# Patient Record
Sex: Female | Born: 1985 | Race: White | Hispanic: No | Marital: Married | Smoking: Former smoker
Health system: Southern US, Community
[De-identification: ages and names within clinical notes are randomized; demographics above are authoritative.]

## PROBLEM LIST (undated history)

## (undated) ENCOUNTER — Inpatient Hospital Stay (HOSPITAL_COMMUNITY): Payer: Self-pay

## (undated) DIAGNOSIS — E039 Hypothyroidism, unspecified: Secondary | ICD-10-CM

## (undated) DIAGNOSIS — M79662 Pain in left lower leg: Secondary | ICD-10-CM

## (undated) DIAGNOSIS — S060XAA Concussion with loss of consciousness status unknown, initial encounter: Secondary | ICD-10-CM

## (undated) DIAGNOSIS — F419 Anxiety disorder, unspecified: Secondary | ICD-10-CM

## (undated) DIAGNOSIS — A6000 Herpesviral infection of urogenital system, unspecified: Secondary | ICD-10-CM

## (undated) DIAGNOSIS — M7989 Other specified soft tissue disorders: Secondary | ICD-10-CM

## (undated) DIAGNOSIS — F909 Attention-deficit hyperactivity disorder, unspecified type: Secondary | ICD-10-CM

## (undated) DIAGNOSIS — Z803 Family history of malignant neoplasm of breast: Secondary | ICD-10-CM

## (undated) DIAGNOSIS — F319 Bipolar disorder, unspecified: Secondary | ICD-10-CM

## (undated) DIAGNOSIS — R87619 Unspecified abnormal cytological findings in specimens from cervix uteri: Secondary | ICD-10-CM

## (undated) DIAGNOSIS — R112 Nausea with vomiting, unspecified: Secondary | ICD-10-CM

## (undated) DIAGNOSIS — F32A Depression, unspecified: Secondary | ICD-10-CM

## (undated) DIAGNOSIS — S060X9A Concussion with loss of consciousness of unspecified duration, initial encounter: Secondary | ICD-10-CM

## (undated) DIAGNOSIS — B977 Papillomavirus as the cause of diseases classified elsewhere: Secondary | ICD-10-CM

## (undated) DIAGNOSIS — K219 Gastro-esophageal reflux disease without esophagitis: Secondary | ICD-10-CM

## (undated) DIAGNOSIS — Z9889 Other specified postprocedural states: Secondary | ICD-10-CM

## (undated) DIAGNOSIS — C801 Malignant (primary) neoplasm, unspecified: Secondary | ICD-10-CM

## (undated) HISTORY — DX: Hypothyroidism, unspecified: E03.9

## (undated) HISTORY — PX: TONSILLECTOMY AND ADENOIDECTOMY: SHX28

## (undated) HISTORY — PX: ABDOMINAL HYSTERECTOMY: SHX81

## (undated) HISTORY — DX: Unspecified abnormal cytological findings in specimens from cervix uteri: R87.619

## (undated) HISTORY — DX: Depression, unspecified: F32.A

## (undated) HISTORY — DX: Family history of malignant neoplasm of breast: Z80.3

---
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
## 2009-06-07 ENCOUNTER — Emergency Department (HOSPITAL_BASED_OUTPATIENT_CLINIC_OR_DEPARTMENT_OTHER): Admission: EM | Admit: 2009-06-07 | Discharge: 2009-06-07 | Payer: Self-pay | Admitting: Emergency Medicine

## 2010-05-31 ENCOUNTER — Emergency Department (HOSPITAL_BASED_OUTPATIENT_CLINIC_OR_DEPARTMENT_OTHER)
Admission: EM | Admit: 2010-05-31 | Discharge: 2010-05-31 | Disposition: A | Discharge status: Home / Self Care | Payer: 59 | Attending: Emergency Medicine | Admitting: Emergency Medicine

## 2010-05-31 DIAGNOSIS — F17200 Nicotine dependence, unspecified, uncomplicated: Secondary | ICD-10-CM | POA: Insufficient documentation

## 2010-05-31 DIAGNOSIS — R112 Nausea with vomiting, unspecified: Secondary | ICD-10-CM | POA: Insufficient documentation

## 2010-05-31 LAB — URINALYSIS, ROUTINE W REFLEX MICROSCOPIC
Glucose, UA: NEGATIVE mg/dL
Hgb urine dipstick: NEGATIVE
Ketones, ur: >80 mg/dL — AB
Protein, ur: NEGATIVE mg/dL
pH: 6 (ref 5.0–8.0)

## 2010-05-31 LAB — CBC
Platelets: 221 10*3/uL (ref 150–400)
RBC: 4.67 MIL/uL (ref 3.87–5.11)
RDW: 12.6 % (ref 11.5–15.5)
WBC: 8.1 10*3/uL (ref 4.0–10.5)

## 2010-05-31 LAB — DIFFERENTIAL
Basophils Absolute: 0 10*3/uL (ref 0.0–0.1)
Eosinophils Absolute: 0.1 10*3/uL (ref 0.0–0.7)
Eosinophils Relative: 1 % (ref 0–5)
Lymphocytes Relative: 33 % (ref 12–46)
Neutrophils Relative %: 59 % (ref 43–77)

## 2010-05-31 LAB — PREGNANCY, URINE: Preg Test, Ur: NEGATIVE

## 2010-05-31 LAB — BASIC METABOLIC PANEL
Chloride: 105 mEq/L (ref 96–112)
GFR calc non Af Amer: >60 mL/min (ref >60)
Potassium: 4 mEq/L (ref 3.5–5.1)
Sodium: 141 mEq/L (ref 135–145)

## 2010-05-31 LAB — GLUCOSE, CAPILLARY

## 2010-06-04 LAB — URINALYSIS, ROUTINE W REFLEX MICROSCOPIC
Bilirubin Urine: NEGATIVE
Leukocytes, UA: NEGATIVE
Nitrite: NEGATIVE
Specific Gravity, Urine: 1.011 (ref 1.005–1.030)
Urobilinogen, UA: 1 mg/dL (ref 0.0–1.0)

## 2010-06-04 LAB — URINE MICROSCOPIC-ADD ON

## 2010-06-04 LAB — COMPREHENSIVE METABOLIC PANEL
ALT: 27 U/L (ref 0–35)
Albumin: 4.1 g/dL (ref 3.5–5.2)
Calcium: 9 mg/dL (ref 8.4–10.5)
Glucose, Bld: 84 mg/dL (ref 70–99)
Sodium: 141 mEq/L (ref 135–145)
Total Protein: 8 g/dL (ref 6.0–8.3)

## 2010-06-04 LAB — DIFFERENTIAL
Eosinophils Absolute: 0.1 10*3/uL (ref 0.0–0.7)
Lymphs Abs: 1.4 10*3/uL (ref 0.7–4.0)
Monocytes Relative: 9 % (ref 3–12)
Neutro Abs: 2 10*3/uL (ref 1.7–7.7)
Neutrophils Relative %: 54 % (ref 43–77)

## 2010-06-04 LAB — CBC
Hemoglobin: 12.8 g/dL (ref 12.0–15.0)
MCHC: 33.9 g/dL (ref 30.0–36.0)
Platelets: 172 10*3/uL (ref 150–400)
RDW: 12.7 % (ref 11.5–15.5)

## 2010-06-04 LAB — PREGNANCY, URINE: Preg Test, Ur: NEGATIVE

## 2010-07-27 NOTE — Discharge Summary (Signed)
NAME:  Lori Callahan, Lori Callahan                           ACCOUNT NO.:  [id number]   MEDICAL RECORD NO.:  [id number]                   PATIENT TYPE:  INP   LOCATION:  0102                                 FACILITY:  [facility]   PHYSICIAN:  Cindie Crumbly, M.D.               DATE OF BIRTH:  14-May-1985   DATE OF ADMISSION:  09/03/2002  DATE OF DISCHARGE:                                 DISCHARGE SUMMARY   REASON FOR ADMISSION:  This 25 year old white female was admitted  complaining of depression, status post 2 overdoses within a period of 3  weeks prior to this admission.  For further history of present illness,  please see the patient's psychiatry admission assessment.   PHYSICAL EXAMINATION:  At the time of admission was significant for obesity.  She had an otherwise unremarkable physical examination.   LABORATORY EXAMINATION:  The patient underwent a laboratory workup to rule  out any other medical problems contributing to her symptomatology.  A urine  drug screen was negative.  A CBC was unremarkable.  A UA was unremarkable.  Basic metabolic panel was within normal limits.  Hepatic panel was within  normal limits.  GGT was within normal limits.  T3, T4 and TSH were all  within normal limits.  A urine pregnancy test was negative.  An RPR was  nonreactive.  A urine probe for gonorrhea and chlamydia as well as an HIV  screen are pending at the time of discharge.   HOSPITAL COURSE:  On admission, the patient was psychomotor agitated,  oppositional and defiant, with decreased concentration, poor impulse  control.  Her affect and mood were depressed, irritable and angry.  She was  intrusive, manipulative and attention seeking.  She was continued on a trial  of Prozac and titrated up to 40 mg p.o. daily.  She tolerated this  medication well without side effects.  She complained of nicotine withdrawal  and was given a Nicoderm patch which she tolerated well.  At the time of  discharge, her affect and  mood have improved.  She denies any homicidal or  suicidal ideation. She is actively participating in all aspects of the  therapeutic treatment program, is motivated for outpatient therapy, and no  longer appears to be a danger to herself or others.  I have discussed her  case at length with Dr. Gardner Candle, her outpatient psychiatrist, regarding  her medication and psychotherapeutic treatments while hospitalized.   CONDITION ON DISCHARGE:  Improved.   DISCHARGE DIAGNOSES:   AXIS I:  1. Major depression, single episode, severe, without psychosis.  2. Oppositional-defiant disorder.  3. Rule out conduct disorder.  4. Nicotine dependence.   AXIS II:  1. Rule out learning disorder not otherwise specified.  2. Rule out personality disorder not otherwise specified.   AXIS III:  Obesity.   AXIS IV:  Severe.   AXIS V:  Code 20 on admission,  code 30 on discharge.   FURTHER EVALUATION AND TREATMENT RECOMMENDATIONS:  1. The patient is discharged to home.  2. She is discharged on an unrestricted level of activity and a regular     diet.  3. She will follow up with her outpatient psychotherapist, Stevphen Meuse, and     her outpatient psychiatrist for medication management, Dr. Gardner Candle,     and consequently I will sign off on the case at this time.  The patient     will follow up with her primary care physician for all further aspects of     her medical care.   DISCHARGE MEDICATIONS:  Prozac 40 mg p.o. daily.                                                 Cindie Crumbly, M.D.    TS/MEDQ  D:  09/08/2002  T:  09/08/2002  Job:  [id number]

## 2010-07-27 NOTE — H&P (Signed)
NAME:  Lori Callahan, Lori Callahan                           ACCOUNT NO.:  [id number]   MEDICAL RECORD NO.:  [id number]                   PATIENT TYPE:  INP   LOCATION:  0102                                 [facility]ACILITY[facility]  [facility]   PHYSICIAN:  Cindie Crumbly, M.D.               DATE OF BIRTH:  [birth year]   DATE OF ADMISSION:  09/03/2002  DATE OF DISCHARGE:                         PSYCHIATRIC ADMISSION ASSESSMENT   REASON FOR ADMISSION:  This 25 year old white female was admitted  complaining of depression status post 2 overdoses as suicide attempts in the  past 3 weeks.  The patient continues to admit to suicidal ideation and  refuses to contract for safety.   HISTORY OF PRESENT ILLNESS:  The patient complains of an increasingly  depressed, irritable and angry mood most of the day nearly every day, giving  up on activities previously found pleasurable, decreased school performance,  anhedonia, feelings of hopelessness, helplessness, worthlessness, decreased  concentration and energy level, increased symptoms of fatigue, hypersomnia,  weight gain, and recurrent thoughts of death.  She refuses to contract for  safety.  Her current psychosocial stressors include frequent arguments with  her boyfriend and her parents.   PAST PSYCHIATRIC HISTORY:  Significant for an overdose on Lexapro  approximately 3 weeks ago and an overdose on Tylenol 5 days ago.  She did  not tell her mother about the overdoses until the day prior to admission  when she was at her psychiatrist's office who then sent her to this facility  for more definitive stabilization.  The patient has been followed in  outpatient treatment by Gardner Candle, M.D. and he has seen her for the past  month.  She has also started seeing a therapist, Stevphen Meuse, who she has  seen 1 time over the past week.  The patient has been on a trial of Lexapro  in the past where she showed only a partial response and then it stopped  working.   DRUG AND ALCOHOL  ABUSE HISTORY:  Significant for the patient smoking 1/2  pack of cigarettes per day for the past 2 months.  She denies any use of  alcohol  or street drugs.   PAST MEDICAL HISTORY:  Significant for obesity and an adenoidectomy in the  past.  She has no known drug allergies or sensitivities.   CURRENT MEDICATIONS:  Prozac 20 mg p.o. daily which she has taken for the  past 2-3 weeks.   STRENGTHS AND ASSETS:  Her parents are supportive of her.   FAMILY AND SOCIAL HISTORY:  The patient lives with her mother, stepfather,  and 40 year old half brother.  Biological father committed suicide when the  patient was 25 years of age.  He had a history of polysubstance dependence.  The patient is a rising 12th grader.   MENTAL STATUS EXAM:  The patient presents as a well-developed, well-  nourished adolescent white female, who is alert, oriented  x4, psychomotor  agitated, and whose appearance is compatible with her stated age.  Her  speech is coherent with a decreased rate and volume of speech, increased  speech latency.  She displays no looseness of associations, phonemic errors  or evidence of a thought disorder.  She displays poor impulse control, is  somewhat oppositional and defiant, with decreased concentration.  Her affect  and mood are depressed, irritable and angry.  Her immediate recall, short  term memory and remote memory are intact.  Similarities and differences are  within normal limits and she is able to abstract the proverbs.  Her thought  processes are goal directed.    ADMISSION DIAGNOSES:   AXIS I:  1. Major depression, single episode, severe, without psychosis.  2. Rule out oppositional-defiant disorder.  3. Nicotine dependence.   AXIS II:  1. Rule out learning disorder not otherwise specified.  2. Rule out personality disorder not otherwise specified.   AXIS III:  Obesity.   AXIS IV:  Severe.   AXIS V:  Code 20.   FURTHER EVALUATION AND TREATMENT  RECOMMENDATIONS:  1. Estimated length of stay for the patient on the inpatient unit is 5 to 7     days.  2. Initial discharge plan is to discharge the patient to home.  3. Initial plan of care is to continue the patient on Prozac and provide a     Nicoderm patch for nicotine withdrawal.  Psychotherapy will focus on     improving the patient's impulse control, decreasing cognitive distortions     and potential for self harm.  A laboratory workup will also be initiated     to rule out any other medical problems contributing to her     symptomatology.                                                 Cindie Crumbly, M.D.    TS/MEDQ  D:  09/04/2002  T:  09/04/2002  Job:  [id number]

## 2011-02-28 ENCOUNTER — Emergency Department (HOSPITAL_BASED_OUTPATIENT_CLINIC_OR_DEPARTMENT_OTHER)
Admission: EM | Admit: 2011-02-28 | Discharge: 2011-02-28 | Disposition: A | Discharge status: Home / Self Care | Payer: 59 | Attending: Emergency Medicine | Admitting: Emergency Medicine

## 2011-02-28 DIAGNOSIS — B009 Herpesviral infection, unspecified: Secondary | ICD-10-CM | POA: Insufficient documentation

## 2011-02-28 DIAGNOSIS — B001 Herpesviral vesicular dermatitis: Secondary | ICD-10-CM

## 2011-02-28 HISTORY — DX: Herpesviral infection of urogenital system, unspecified: A60.00

## 2011-02-28 HISTORY — DX: Papillomavirus as the cause of diseases classified elsewhere: B97.7

## 2011-02-28 MED ORDER — HYDROCODONE-ACETAMINOPHEN 5-500 MG PO TABS: 1.0000 | ORAL_TABLET | Freq: Four times a day (QID) | ORAL | Status: AC | PRN

## 2011-02-28 MED ORDER — ACYCLOVIR 400 MG PO TABS: 400.0000 mg | ORAL_TABLET | Freq: Three times a day (TID) | ORAL | Status: AC

## 2011-02-28 NOTE — ED Provider Notes (Signed)
History     CSN: [id number]  Arrival date & time 02/28/11  6213   First MD Initiated Contact with Patient 02/28/11 2102      Chief Complaint  Patient presents with  . Rash    (Consider location/radiation/quality/duration/timing/severity/associated sxs/prior treatment) HPI Comments: Pt states that she was seen by her ob and was given a cream for wart:pt states that now she has pain and rash to the area  Patient is a 25 y.o. female presenting with rash. The history is provided by the patient. No language interpreter was used.  Rash  This is a new problem. The current episode started 2 days ago. The problem has not changed since onset.The problem is associated with nothing. There has been no fever. The patient is experiencing no pain. The pain has been constant since onset. Associated symptoms include pain and weeping. She has tried nothing for the symptoms.    Past Medical History  Diagnosis Date  . HPV (human papilloma virus) infection   . Herpes genitalia     Past Surgical History  Procedure Date  . Cesarean section     No family history on file.  History  Substance Use Topics  . Smoking status: Current Everyday Smoker  . Smokeless tobacco: Not on file  . Alcohol Use: No    OB History    Grav Para Term Preterm Abortions TAB SAB Ect Mult Living                  Review of Systems  Skin: Positive for rash.  All other systems reviewed and are negative.    Allergies  Review of patient's allergies indicates no known allergies.  Home Medications   Current Outpatient Rx  Name Route Sig Dispense Refill  . VITAMIN B-12 PO Oral Take 1 tablet by mouth daily.      . IMIQUIMOD 5 % EX CREA Topical Apply 1 application topically 3 (three) times a week.      Marland Kitchen LYSINE PO Oral Take 1 tablet by mouth daily.      . ADULT MULTIVITAMIN W/MINERALS CH Oral Take 1 tablet by mouth daily.      Marland Kitchen NAPROXEN SODIUM 550 MG PO TABS Oral Take 550 mg by mouth once as needed. For pain         BP 128/73  Pulse 88  Temp(Src) 98.5 F (36.9 C) (Oral)  Resp 16  Ht 5\' 5"  (1.651 m)  Wt 190 lb (86.183 kg)  BMI 31.62 kg/m2  SpO2 100%  LMP 02/04/2011  Physical Exam  Nursing note and vitals reviewed. Constitutional: She appears well-developed and well-nourished.  Cardiovascular: Normal rate and regular rhythm.   Pulmonary/Chest: Effort normal and breath sounds normal.  Genitourinary:       Pt has multiple indurated draining ulcer to bilateral labia  Musculoskeletal: Normal range of motion.  Neurological: She is alert.    ED Course  Procedures (including critical care time)  Labs Reviewed - No data to display No results found.   1. Herpes labialis       MDM  Pt has history of herpes:exam consistent with the same:will treat        Teressa Lower, NP 02/28/11 2129  Teressa Lower, NP 02/28/11 2129

## 2011-02-28 NOTE — ED Notes (Signed)
C/o rash to vaginal/rectal area after using cream to treat HPV

## 2011-02-28 NOTE — ED Provider Notes (Signed)
Medical screening examination/treatment/procedure(s) were performed by non-physician practitioner and as supervising physician I was immediately available for consultation/collaboration.  Juliet Rude. Rubin Payor, MD 02/28/11 [phone]

## 2011-07-18 ENCOUNTER — Inpatient Hospital Stay (HOSPITAL_COMMUNITY)
Admission: AD | Admit: 2011-07-18 | Discharge: 2011-07-18 | Disposition: A | Discharge status: Home / Self Care | Payer: Medicaid Other | Source: Ambulatory Visit | Attending: Obstetrics & Gynecology | Admitting: Obstetrics & Gynecology

## 2011-07-18 ENCOUNTER — Inpatient Hospital Stay (HOSPITAL_COMMUNITY): Payer: Medicaid Other

## 2011-07-18 ENCOUNTER — Encounter (HOSPITAL_COMMUNITY): Payer: Self-pay | Admitting: *Deleted

## 2011-07-18 DIAGNOSIS — O26899 Other specified pregnancy related conditions, unspecified trimester: Secondary | ICD-10-CM

## 2011-07-18 DIAGNOSIS — R109 Unspecified abdominal pain: Secondary | ICD-10-CM

## 2011-07-18 DIAGNOSIS — O219 Vomiting of pregnancy, unspecified: Secondary | ICD-10-CM

## 2011-07-18 DIAGNOSIS — O210 Mild hyperemesis gravidarum: Secondary | ICD-10-CM | POA: Insufficient documentation

## 2011-07-18 LAB — URINALYSIS, ROUTINE W REFLEX MICROSCOPIC
Glucose, UA: NEGATIVE mg/dL
Leukocytes, UA: NEGATIVE
pH: 6 (ref 5.0–8.0)

## 2011-07-18 LAB — POCT PREGNANCY, URINE: Preg Test, Ur: POSITIVE — AB

## 2011-07-18 LAB — CBC
HCT: 38.7 % (ref 36.0–46.0)
MCH: 30.8 pg (ref 26.0–34.0)
MCHC: 34.1 g/dL (ref 30.0–36.0)
RDW: 13 % (ref 11.5–15.5)

## 2011-07-18 LAB — WET PREP, GENITAL: Yeast Wet Prep HPF POC: NONE SEEN

## 2011-07-18 MED ORDER — ONDANSETRON 8 MG PO TBDP
8.0000 mg | ORAL_TABLET | Freq: Once | ORAL | Status: AC
  Administered 2011-07-18: 8 mg via ORAL
  Filled 2011-07-18: qty 1

## 2011-07-18 MED ORDER — PROMETHAZINE HCL 25 MG PO TABS: 12.5000 mg | ORAL_TABLET | Freq: Four times a day (QID) | ORAL | Status: DC | PRN

## 2011-07-18 NOTE — MAU Provider Note (Signed)
History     CSN: [id number]  Arrival date & time 07/18/11  1959   None     Chief Complaint  Patient presents with  . Morning Sickness    (Consider location/radiation/quality/duration/timing/severity/associated sxs/prior treatment) HPI Lori Callahan is a 26 y.o. female @ [redacted]w[redacted]d gestation who presents to [facility]U for lower abdominal cramping that started a few days ago. [facility] pregnancy test. She noted vaginal spotting a few days ago but none since then. Complains of nausea and vomiting. The history was provided by the patient.  Past Medical History  Diagnosis Date  . HPV (human papilloma virus) infection   . Herpes genitalia     Past Surgical History  Procedure Date  . Cesarean section     Family History  Problem Relation Age of Onset  . Cancer Maternal Grandmother     History  Substance Use Topics  . Smoking status: Current Everyday Smoker  . Smokeless tobacco: Not on file  . Alcohol Use: No    OB History    Grav Para Term Preterm Abortions TAB SAB Ect Mult Living   3 1 1       1       Review of Systems  Constitutional: Positive for appetite change and fatigue. Negative for fever and chills.  HENT: Negative.   Eyes: Negative.   Respiratory: Negative.   Cardiovascular: Negative.   Gastrointestinal: Positive for nausea, vomiting and abdominal pain. Negative for diarrhea and constipation.  Genitourinary: Positive for frequency and pelvic pain. Negative for dysuria, urgency, vaginal bleeding and vaginal discharge.  Musculoskeletal: Negative for back pain.  Skin: Negative.   Neurological: Negative for dizziness and headaches.  Psychiatric/Behavioral: Negative for confusion. The patient is not nervous/anxious.     Allergies  Review of patient's allergies indicates no known allergies.  Home Medications  No current outpatient prescriptions on file.  BP 132/80  Pulse 101  Temp(Src) 98 F (36.7 C) (Oral)  Resp  18  Ht 5\' 5"  (1.651 m)  Wt 185 lb (83.915 kg)  BMI 30.79 kg/m2  LMP 06/03/2011  Physical Exam  Nursing note and vitals reviewed. Constitutional: She is oriented to person, place, and time. She appears well-developed and well-nourished. No distress.  HENT:  Head: Normocephalic.  Eyes: EOM are normal.  Neck: Neck supple.  Cardiovascular:       Tachycardia   Pulmonary/Chest: Effort normal.  Abdominal: Soft. There is no tenderness.       Unable to reproduce the cramping pain that the patient has had.  Genitourinary:       External genitalia without lesions. White discharge vaginal vault. No blood noted. Cervix long, closed, no CMT, no adnexal tenderness. Uterus slightly enlarged.  Musculoskeletal: Normal range of motion.  Neurological: She is alert and oriented to person, place, and time. No cranial nerve deficit.  Skin: Skin is warm and dry.  Psychiatric: She has a normal mood and affect. Her behavior is normal. Judgment and thought content normal.   Results for orders placed during the hospital encounter of 07/18/11 (from the past 24 hour(s))  URINALYSIS, ROUTINE W REFLEX MICROSCOPIC     Status: Abnormal   Collection Time   07/18/11  8:25 PM      Component Value Range   Color, Urine YELLOW  YELLOW    APPearance CLEAR  CLEAR    Specific Gravity, Urine <1.005 (*) 1.005 - 1.030    pH 6.0  5.0 - 8.0  Glucose, UA NEGATIVE  NEGATIVE (mg/dL)   Hgb urine dipstick NEGATIVE  NEGATIVE    Bilirubin Urine NEGATIVE  NEGATIVE    Ketones, ur NEGATIVE  NEGATIVE (mg/dL)   Protein, ur NEGATIVE  NEGATIVE (mg/dL)   Urobilinogen, UA 0.2  0.0 - 1.0 (mg/dL)   Nitrite NEGATIVE  NEGATIVE    Leukocytes, UA NEGATIVE  NEGATIVE   POCT PREGNANCY, URINE     Status: Abnormal   Collection Time   07/18/11  8:36 PM      Component Value Range   Preg Test, Ur POSITIVE (*) NEGATIVE   HCG, QUANTITATIVE, PREGNANCY     Status: Abnormal   Collection Time   07/18/11  9:30 PM      Component Value Range   hCG, Beta  Chain, Quant, S 96045 (*) <5 (mIU/mL)  ABO/RH     Status: Normal (Preliminary result)   Collection Time   07/18/11  9:30 PM      Component Value Range   ABO/RH(D) B POS    CBC     Status: Normal   Collection Time   07/18/11  9:30 PM      Component Value Range   WBC 9.5  4.0 - 10.5 (K/uL)   RBC 4.28  3.87 - 5.11 (MIL/uL)   Hemoglobin 13.2  12.0 - 15.0 (g/dL)   HCT 40.9  81.1 - 91.4 (%)   MCV 90.4  78.0 - 100.0 (fL)   MCH 30.8  26.0 - 34.0 (pg)   MCHC 34.1  30.0 - 36.0 (g/dL)   RDW 78.2  95.6 - 21.3 (%)   Platelets 183  150 - 400 (K/uL)   ED Course  Procedures US Ob Comp Less 14 Wks  07/18/2011  *RADIOLOGY REPORT*  Clinical Data: Positive pregnancy test with cramping and vaginal spotting.  OBSTETRIC <14 WK ULTRASOUND  Technique:  Transabdominal ultrasound was performed for evaluation of the gestation as well as the maternal uterus and adnexal regions.  Comparison:  None.  Intrauterine gestational sac: Single intrauterine gestational sac visualized. Yolk sac: Visualized Embryo: Visualized Cardiac Activity: Visualized Heart Rate: 149 bpm  CRL:  5.8 mm  6w  3d          [place] EDC: 03/09/2012  Maternal uterus/Adnexae: No evidence for subchorionic hemorrhage.  The maternal ovaries are unremarkable.  No free fluid in the cul-de-sac.  IMPRESSION: Single living intrauterine gestation at estimated 6-week-3-day gestational age by crown-rump length.  Original Report Authenticated By: ERIC A. MANSELL, M.D.    Assessment: IUP @ 6 weeks 3 days with cardiac activity   Discomforts of pregnancy   Nausea and vomiting during pregnancy  Plan:  Start prenatal care   Rx Phenergan   Return as needed.   MDM

## 2011-07-18 NOTE — MAU Note (Signed)
Nausea for the last 2 weeks. A little bit of cramping and spotting on and off for a week.

## 2011-07-18 NOTE — Discharge Instructions (Signed)
  ________________________________________     To schedule your Maternity Eligibility Appointment, please call [phone].  When you arrive for your appointment you must bring the following items or information listed below.  Your appointment will be rescheduled if you do not have these items or are 15 minutes late. If currently receiving Medicaid, you MUST bring: 1. Medicaid Card 2. Social Security Card 3. Picture ID 4. Proof of Pregnancy 5. Verification of current address if the address on Medicaid card is incorrect "postmarked mail" If not receiving Medicaid, you MUST bring: 1. Social Security Card 2. Picture ID 3. Birth Certificate (if available) Passport or *Green Card 4. Proof of Pregnancy 5. Verification of current address "postmarked mail" for each income presented. 6. Verification of insurance coverage, if any 7. Check stubs from each employer for the previous month (if unable to present check stub  for each week, we will accept check stub for the first and last week ill the same month.) If you can't locate check stubs, you must bring a letter from the employer(s) and it must have the following information on letterhead, typed, in English: o name of company o company telephone number o how long been with the company, if less than one month o how much person earns per hour o how many hours per week work o the gross pay the person earned for the previous month If you are 26 years old or less, you do not have to bring proof of income unless you work or live with the father of the baby and at that time we will need proof of income from you and/or the father of the baby. Green Card recipients are eligible for Medicaid for Pregnant Women (MPW)    Abdominal Pain During Pregnancy Abdominal discomfort is common in pregnancy. Most of the time, it does not cause harm. There are many causes of abdominal pain. Some causes are more serious than others. Some of the causes of abdominal  pain in pregnancy are easily diagnosed. Occasionally, the diagnosis takes time to understand. Other times, the cause is not determined. Abdominal pain can be a sign that something is very wrong with the pregnancy, or the pain may have nothing to do with the pregnancy at all. For this reason, always tell your caregiver if you have any abdominal discomfort. CAUSES Common and harmless causes of abdominal pain include:  Constipation.   Excess gas and bloating.   Round ligament pain. This is pain that is felt in the folds of the groin.   The position the baby or placenta is in.   Baby kicks.   Braxton-Hicks contractions. These are mild contractions that do not cause cervical dilation.  Serious causes of abdominal pain include:  Ectopic pregnancy. This happens when a fertilized egg implants outside of the uterus.   Miscarriage.   Preterm labor. This is when labor starts at less than 37 weeks of pregnancy.   Placental abruption. This is when the placenta partially or completely separates from the uterus.   Preeclampsia. This is often associated with high blood pressure and has been referred to as "toxemia in pregnancy."   Uterine or amniotic fluid infections.  Causes unrelated to pregnancy include:  Urinary tract infection.   Gallbladder stones or inflammation.   Hepatitis or other liver illness.   Intestinal problems, stomach flu, food poisoning, or ulcer.   Appendicitis.   Kidney (renal) stones.   Kidney infection (pylonephritis).  HOME CARE INSTRUCTIONS  For mild pain:  Do not   have sexual intercourse or put anything in your vagina until your symptoms go away completely.   Get plenty of rest until your pain improves. If your pain does not improve in 1 hour, call your caregiver.   Drink clear fluids if you feel nauseous. Avoid solid food as long as you are uncomfortable or nauseous.   Only take medicine as directed by your caregiver.   Keep all follow-up appointments  with your caregiver.  SEEK IMMEDIATE MEDICAL CARE IF:  You are bleeding, leaking fluid, or passing tissue from the vagina.   You have increasing pain or cramping.   You have persistent vomiting.   You have painful or bloody urination.   You have a fever.   You notice a decrease in your baby's movements.   You have extreme weakness or feel faint.   You have shortness of breath, with or without abdominal pain.   You develop a severe headache with abdominal pain.   You have abnormal vaginal discharge with abdominal pain.   You have persistent diarrhea.   You have abdominal pain that continues even after rest, or gets worse.  MAKE SURE YOU:   Understand these instructions.   Will watch your condition.   Will get help right away if you are not doing well or get worse.  Document Released: 02/25/2005 Document Revised: 02/14/2011 Document Reviewed: 09/21/2010 Exit[employer] Patient Information 2012 [employer].Abdominal Pain During Pregnancy Abdominal discomfort is common in pregnancy. Most of the time, it does not cause harm. There are many causes of abdominal pain. Some causes are more serious than others. Some of the causes of abdominal pain in pregnancy are easily diagnosed. Occasionally, the diagnosis takes time to understand. Other times, the cause is not determined. Abdominal pain can be a sign that something is very wrong with the pregnancy, or the pain may have nothing to do with the pregnancy at all. For this reason, always tell your caregiver if you have any abdominal discomfort. CAUSES Common and harmless causes of abdominal pain include:  Constipation.   Excess gas and bloating.   Round ligament pain. This is pain that is felt in the folds of the groin.   The position the baby or placenta is in.   Baby kicks.   Braxton-Hicks contractions. These are mild contractions that do not cause cervical dilation.  Serious causes of abdominal pain include:  Ectopic  pregnancy. This happens when a fertilized egg implants outside of the uterus.   Miscarriage.   Preterm labor. This is when labor starts at less than 37 weeks of pregnancy.   Placental abruption. This is when the placenta partially or completely separates from the uterus.   Preeclampsia. This is often associated with high blood pressure and has been referred to as "toxemia in pregnancy."   Uterine or amniotic fluid infections.  Causes unrelated to pregnancy include:  Urinary tract infection.   Gallbladder stones or inflammation.   Hepatitis or other liver illness.   Intestinal problems, stomach flu, food poisoning, or ulcer.   Appendicitis.   Kidney (renal) stones.   Kidney infection (pylonephritis).  HOME CARE INSTRUCTIONS  For mild pain:  Do not have sexual intercourse or put anything in your vagina until your symptoms go away completely.   Get plenty of rest until your pain improves. If your pain does not improve in 1 hour, call your caregiver.   Drink clear fluids if you feel nauseous. Avoid solid food as long as you are uncomfortable or nauseous.     Only take medicine as directed by your caregiver.   Keep all follow-up appointments with your caregiver.  SEEK IMMEDIATE MEDICAL CARE IF:  You are bleeding, leaking fluid, or passing tissue from the vagina.   You have increasing pain or cramping.   You have persistent vomiting.   You have painful or bloody urination.   You have a fever.   You notice a decrease in your baby's movements.   You have extreme weakness or feel faint.   You have shortness of breath, with or without abdominal pain.   You develop a severe headache with abdominal pain.   You have abnormal vaginal discharge with abdominal pain.   You have persistent diarrhea.   You have abdominal pain that continues even after rest, or gets worse.  MAKE SURE YOU:   Understand these instructions.   Will watch your condition.   Will get help  right away if you are not doing well or get worse.  Document Released: 02/25/2005 Document Revised: 02/14/2011 Document Reviewed: 09/21/2010 Exit[employer] Patient Information 2012 [employer]. 

## 2011-07-19 LAB — GC/CHLAMYDIA PROBE AMP, GENITAL: Chlamydia, DNA Probe: NEGATIVE

## 2011-07-22 NOTE — MAU Provider Note (Signed)
Medical Screening exam and patient care preformed by advanced practice provider.  Agree with the above management.  

## 2011-09-20 ENCOUNTER — Emergency Department (HOSPITAL_BASED_OUTPATIENT_CLINIC_OR_DEPARTMENT_OTHER): Payer: Medicaid Other

## 2011-09-20 ENCOUNTER — Emergency Department (HOSPITAL_BASED_OUTPATIENT_CLINIC_OR_DEPARTMENT_OTHER)
Admission: EM | Admit: 2011-09-20 | Discharge: 2011-09-20 | Disposition: A | Discharge status: Home / Self Care | Payer: Medicaid Other | Attending: Emergency Medicine | Admitting: Emergency Medicine

## 2011-09-20 ENCOUNTER — Encounter (HOSPITAL_BASED_OUTPATIENT_CLINIC_OR_DEPARTMENT_OTHER): Payer: Self-pay | Admitting: Family Medicine

## 2011-09-20 DIAGNOSIS — E860 Dehydration: Secondary | ICD-10-CM | POA: Insufficient documentation

## 2011-09-20 DIAGNOSIS — R109 Unspecified abdominal pain: Secondary | ICD-10-CM | POA: Insufficient documentation

## 2011-09-20 DIAGNOSIS — R42 Dizziness and giddiness: Secondary | ICD-10-CM | POA: Insufficient documentation

## 2011-09-20 DIAGNOSIS — R5383 Other fatigue: Secondary | ICD-10-CM | POA: Insufficient documentation

## 2011-09-20 DIAGNOSIS — O210 Mild hyperemesis gravidarum: Secondary | ICD-10-CM | POA: Insufficient documentation

## 2011-09-20 DIAGNOSIS — R5381 Other malaise: Secondary | ICD-10-CM | POA: Insufficient documentation

## 2011-09-20 LAB — URINALYSIS, ROUTINE W REFLEX MICROSCOPIC
Bilirubin Urine: NEGATIVE
Hgb urine dipstick: NEGATIVE
Ketones, ur: >80 mg/dL — AB
Protein, ur: NEGATIVE mg/dL
Urobilinogen, UA: 0.2 mg/dL (ref 0.0–1.0)

## 2011-09-20 LAB — BASIC METABOLIC PANEL
BUN: 4 mg/dL — ABNORMAL LOW (ref 6–23)
CO2: 24 mEq/L (ref 19–32)
Calcium: 8.9 mg/dL (ref 8.4–10.5)
Creatinine, Ser: 0.5 mg/dL (ref 0.50–1.10)
GFR calc non Af Amer: >90 mL/min (ref >90)
Glucose, Bld: 82 mg/dL (ref 70–99)
Sodium: 138 mEq/L (ref 135–145)

## 2011-09-20 MED ORDER — METOCLOPRAMIDE HCL 5 MG/ML IJ SOLN
10.0000 mg | Freq: Once | INTRAMUSCULAR | Status: AC
  Administered 2011-09-20: 10 mg via INTRAVENOUS

## 2011-09-20 MED ORDER — ONDANSETRON 4 MG PO TBDP: 4.0000 mg | ORAL_TABLET | Freq: Three times a day (TID) | ORAL | Status: AC | PRN

## 2011-09-20 MED ORDER — METOCLOPRAMIDE HCL 5 MG/ML IJ SOLN
INTRAMUSCULAR | Status: AC
  Filled 2011-09-20: qty 2

## 2011-09-20 MED ORDER — SODIUM CHLORIDE 0.9 % IV BOLUS (SEPSIS)
1000.0000 mL | Freq: Once | INTRAVENOUS | Status: AC
  Administered 2011-09-20: 1000 mL via INTRAVENOUS

## 2011-09-20 MED ORDER — ONDANSETRON HCL 4 MG/2ML IJ SOLN
INTRAMUSCULAR | Status: AC
  Filled 2011-09-20: qty 2

## 2011-09-20 MED ORDER — METOCLOPRAMIDE HCL 5 MG/ML IJ SOLN: 10.0000 mg | Freq: Once | INTRAMUSCULAR | Status: DC

## 2011-09-20 MED ORDER — METOCLOPRAMIDE HCL 10 MG PO TABS: 10.0000 mg | ORAL_TABLET | Freq: Four times a day (QID) | ORAL | Status: DC

## 2011-09-20 MED ORDER — ONDANSETRON HCL 4 MG/2ML IJ SOLN
4.0000 mg | Freq: Once | INTRAMUSCULAR | Status: AC
  Administered 2011-09-20: 4 mg via INTRAVENOUS
  Filled 2011-09-20: qty 2

## 2011-09-20 MED ORDER — POTASSIUM CHLORIDE 20 MEQ/15ML (10%) PO LIQD
20.0000 meq | Freq: Once | ORAL | Status: AC
  Administered 2011-09-20: 20 meq via ORAL
  Filled 2011-09-20: qty 15

## 2011-09-20 MED ORDER — ONDANSETRON HCL 4 MG/2ML IJ SOLN
4.0000 mg | Freq: Once | INTRAMUSCULAR | Status: AC
  Administered 2011-09-20: 4 mg via INTRAVENOUS

## 2011-09-20 NOTE — ED Notes (Signed)
Pt sts she is [redacted] wks pregnant and has been feeling nauseous, loss of appetite and dizzy x 10wks. Pt sts she has not an OB/GYN yet. Pt sts this is 2nd pregnancy.

## 2011-09-20 NOTE — ED Provider Notes (Signed)
History     CSN: [id number]  Arrival date & time 09/20/11  1420   First MD Initiated Contact with Patient 09/20/11 1526      Chief Complaint  Patient presents with  . Morning Sickness    (Consider location/radiation/quality/duration/timing/severity/associated sxs/prior treatment) HPI  G2P1 15 week preg my LMP pw N/V.  Reports vomiting multiplt times per day since the beginning of the pregnancy. Worse since last night. Has been to ED once previous for hydration. Denies hematuria/dysuria/freq/urgency. Denies fever/chills/abd pain/gush of fluid/vaginal bleeding. +Gen weakness, lightheadedness- worse with standing.   ED Notes, ED Provider Notes from 09/20/11 0000 to 09/20/11 14:32:24       Avanell Shackleton, RN 09/20/2011 14:31      Pt sts she is [redacted] wks pregnant and has been feeling nauseous, loss of appetite and dizzy x 10wks. Pt sts she has not an OB/GYN yet. Pt sts this is 2nd pregnancy.     Past Medical History  Diagnosis Date  . HPV (human papilloma virus) infection   . Herpes genitalia     Past Surgical History  Procedure Date  . Cesarean section     Family History  Problem Relation Age of Onset  . Cancer Maternal Grandmother     History  Substance Use Topics  . Smoking status: Former Smoker    Quit date: 09/13/2011  . Smokeless tobacco: Not on file  . Alcohol Use: No    OB History    Grav Para Term Preterm Abortions TAB SAB Ect Mult Living   3 1 1       1       Review of Systems  All other systems reviewed and are negative.   except as noted HPI   Allergies  Review of patient's allergies indicates no known allergies.  Home Medications   Current Outpatient Rx  Name Route Sig Dispense Refill  . IMIQUIMOD 5 % EX CREA Topical Apply 1 application topically 3 (three) times a week.    Marland Kitchen PRENATAL MULTIVITAMIN CH Oral Take 1 tablet by mouth at bedtime.    Marland Kitchen METOCLOPRAMIDE HCL 10 MG PO TABS Oral Take 1 tablet (10 mg total) by mouth every 6 (six) hours. 30  tablet 0  . ONDANSETRON 4 MG PO TBDP Oral Take 1 tablet (4 mg total) by mouth every 8 (eight) hours as needed for nausea. 20 tablet 3  . PROMETHAZINE HCL 25 MG PO TABS Oral Take 0.5 tablets (12.5 mg total) by mouth every 6 (six) hours as needed for nausea. 20 tablet 0    BP 107/67  Pulse 101  Temp 99.2 F (37.3 C) (Oral)  Resp 16  Ht 5\' 5"  (1.651 m)  Wt 180 lb (81.647 kg)  BMI 29.95 kg/m2  SpO2 98%  LMP 06/03/2011  Physical Exam  Nursing note and vitals reviewed. Constitutional: She is oriented to person, place, and time. She appears well-developed.  HENT:  Head: Atraumatic.       Mm dry  Eyes: Conjunctivae and EOM are normal. Pupils are equal, round, and reactive to light.  Neck: Normal range of motion. Neck supple.  Cardiovascular: Normal rate, regular rhythm, normal heart sounds and intact distal pulses.   Pulmonary/Chest: Effort normal and breath sounds normal. No respiratory distress. She has no wheezes. She has no rales.  Abdominal: Soft. She exhibits no distension. There is tenderness. There is no rebound and no guarding.       Mild diffuse lower abd ttp  Musculoskeletal: Normal range of  motion.  Neurological: She is alert and oriented to person, place, and time.  Skin: Skin is warm and dry. No rash noted.  Psychiatric: She has a normal mood and affect.    ED Course  Procedures (including critical care time)  Labs Reviewed  URINALYSIS, ROUTINE W REFLEX MICROSCOPIC - Abnormal; Notable for the following:    APPearance CLOUDY (*)     Ketones, ur >80 (*)     All other components within normal limits  BASIC METABOLIC PANEL - Abnormal; Notable for the following:    Potassium 3.3 (*)     BUN 4 (*)     All other components within normal limits   US Ob Limited  09/20/2011  *RADIOLOGY REPORT*  Clinical Data: Emesis.  LIMITED OBSTETRIC ULTRASOUND  Number of Fetuses: 1 Heart Rate: 163 bpm Movement: Yes. Presentation: Variable. Placental Location: Posterior. Previa: No.  Amniotic Fluid (Subjective): Appears decreased.  Vertical pocket:  4.0cm  BPD: 3.0cm   15w   4d   EDC: 03/09/2012  MATERNAL FINDINGS: Cervix: Closed. Uterus/Adnexae: No acute findings.  IMPRESSION:  1.  No acute findings. 2.  Amniotic fluid appears subjectively slightly decreased.  Recommend followup with non-emergent complete OB 14+ wk US examination for fetal biometric evaluation and anatomic survey if not already performed.  Original Report Authenticated By: Reyes Ivan, M.D.     1. Hyperemesis gravidarum   2. Dehydration       MDM  Likely hyperemesis gravidarum with dehydration. She does have min abdominal ttp. OB U/S ordered with min decreased amniotic fluid +IUP 15wks. Pt aware and will f/u with OBGYN, previously scheduled appt. Tolerating PO in ED without vomiting and feeling better. No EMC precluding discharge at this time. Given Precautions for return. PMD f/u.      Forbes Cellar, MD 09/20/11 [phone]

## 2011-10-04 LAB — OB RESULTS CONSOLE RUBELLA ANTIBODY, IGM: Rubella: IMMUNE

## 2011-10-04 LAB — OB RESULTS CONSOLE HEPATITIS B SURFACE ANTIGEN: Hepatitis B Surface Ag: NEGATIVE

## 2011-10-04 LAB — OB RESULTS CONSOLE RPR: RPR: NONREACTIVE

## 2011-10-04 LAB — OB RESULTS CONSOLE ANTIBODY SCREEN: Antibody Screen: NEGATIVE

## 2011-10-22 ENCOUNTER — Encounter (HOSPITAL_COMMUNITY): Payer: Self-pay | Admitting: *Deleted

## 2011-10-22 ENCOUNTER — Inpatient Hospital Stay (HOSPITAL_COMMUNITY)
Admission: AD | Admit: 2011-10-22 | Discharge: 2011-10-22 | Disposition: A | Discharge status: Home / Self Care | Payer: Medicaid Other | Source: Ambulatory Visit | Attending: Obstetrics and Gynecology | Admitting: Obstetrics and Gynecology

## 2011-10-22 DIAGNOSIS — O210 Mild hyperemesis gravidarum: Secondary | ICD-10-CM | POA: Insufficient documentation

## 2011-10-22 DIAGNOSIS — O211 Hyperemesis gravidarum with metabolic disturbance: Secondary | ICD-10-CM

## 2011-10-22 MED ORDER — METOCLOPRAMIDE HCL 10 MG PO TABS: 10.0000 mg | ORAL_TABLET | Freq: Four times a day (QID) | ORAL | Status: DC

## 2011-10-22 MED ORDER — DEXTROSE 5 % IN LACTATED RINGERS IV BOLUS
1000.0000 mL | Freq: Once | INTRAVENOUS | Status: AC
  Administered 2011-10-22: 1000 mL via INTRAVENOUS

## 2011-10-22 MED ORDER — PROMETHAZINE HCL 25 MG RE SUPP: 25.0000 mg | Freq: Four times a day (QID) | RECTAL | Status: DC | PRN

## 2011-10-22 MED ORDER — RANITIDINE HCL 150 MG PO TABS: 150.0000 mg | ORAL_TABLET | Freq: Two times a day (BID) | ORAL | Status: DC

## 2011-10-22 MED ORDER — PROMETHAZINE HCL 25 MG/ML IJ SOLN
25.0000 mg | INTRAMUSCULAR | Status: AC
  Administered 2011-10-22: 25 mg via INTRAVENOUS
  Filled 2011-10-22: qty 1

## 2011-10-22 MED ORDER — PROMETHAZINE HCL 12.5 MG PO TABS: 12.5000 mg | ORAL_TABLET | Freq: Four times a day (QID) | ORAL | Status: DC | PRN

## 2011-10-22 NOTE — MAU Note (Signed)
Patient complains of vomiting during the entire pregnancy. Nothing seems to help. Zofran taken around 7pm. Vomited maybe 30 times since 630am yesterday.

## 2011-10-22 NOTE — MAU Provider Note (Signed)
Chief Complaint:  Emesis During Pregnancy   First Provider Initiated Contact with Patient 10/22/11 0208      HPI  Lori Callahan is a 26 y.o. G2P1001 at [redacted]w[redacted]d presenting with emesis x30 in last 24 hours.  She reports hyperemesis of pregnancy since early gestation but worsening in last 2 days and she has been unable to keep any food or liquids down.  She reports good fetal movement, denies LOF, vaginal bleeding, vaginal itching/burning, urinary symptoms, h/a, dizziness, or fever/chills.     Pregnancy Course: uncomplicated  Past Medical History: Past Medical History  Diagnosis Date  . HPV (human papilloma virus) infection   . Herpes genitalia     Past Surgical History: Past Surgical History  Procedure Date  . Cesarean section     Family History: Family History  Problem Relation Age of Onset  . Cancer Maternal Grandmother     Social History: History  Substance Use Topics  . Smoking status: Former Smoker    Quit date: 09/13/2011  . Smokeless tobacco: Not on file  . Alcohol Use: No    Allergies: No Known Allergies  Meds:  Prescriptions prior to admission  Medication Sig Dispense Refill  . imiquimod (ALDARA) 5 % cream Apply 1 application topically 3 (three) times a week.      . ondansetron (ZOFRAN-ODT) 8 MG disintegrating tablet Take 8 mg by mouth every 8 (eight) hours as needed.      . Prenatal Vit-Fe Fumarate-FA (PRENATAL MULTIVITAMIN) TABS Take 1 tablet by mouth at bedtime.      . metoCLOPramide (REGLAN) 10 MG tablet Take 1 tablet (10 mg total) by mouth every 6 (six) hours.  30 tablet  0  . promethazine (PHENERGAN) 25 MG tablet Take 0.5 tablets (12.5 mg total) by mouth every 6 (six) hours as needed for nausea.  20 tablet  0      Physical Exam  Blood pressure 120/74, temperature 98.7 F (37.1 C), temperature source Oral, resp. rate 18, height 5\' 5"  (1.651 m), weight 83.462 kg (184 lb), last menstrual period 06/03/2011. GENERAL: Well-developed, well-nourished female  in no acute distress.  HEENT: normocephalic, good dentition HEART: normal rate RESP: normal effort ABDOMEN: Soft, nontender, gravid appropriate for gestational age EXTREMITIES: Nontender, no edema NEURO: alert and oriented  SPECULUM EXAM: Deferred  FHR 144 by EFM   Pt unable to give urine sample, only gave 1-2 drops of urine upon arrival in MAU  Assessment: IUP [redacted]w[redacted]d by LMP Hyperemesis gravidarium    Plan: D5LR x1000 ml x1, Phenergan 25 mg IV push in MAU Called Dr Dareen Piano to discuss assessment and findings Renew prescriptions to Phenergan 12.5 mg and Reglan 10 mg PO, Take Q 6 h PRN Phenergan 25 mg suppository PRN if unable to keep down PO meds Zantac 150 mg PO BID F/U with your prenatal provider Return to MAU as needed    LEFTWICH-KIRBY, Calah Gershman 8/13/20132:18 AM

## 2012-02-19 ENCOUNTER — Encounter (HOSPITAL_COMMUNITY): Payer: Self-pay | Admitting: Pharmacist

## 2012-02-26 ENCOUNTER — Encounter (HOSPITAL_COMMUNITY): Payer: Self-pay

## 2012-02-28 ENCOUNTER — Encounter (HOSPITAL_COMMUNITY)
Admission: RE | Admit: 2012-02-28 | Discharge: 2012-02-28 | Disposition: A | Discharge status: Home / Self Care | Payer: Medicaid Other | Source: Ambulatory Visit | Attending: Obstetrics & Gynecology | Admitting: Obstetrics & Gynecology

## 2012-02-28 ENCOUNTER — Encounter (HOSPITAL_COMMUNITY): Payer: Self-pay

## 2012-02-28 HISTORY — DX: Other specified postprocedural states: Z98.890

## 2012-02-28 HISTORY — DX: Anxiety disorder, unspecified: F41.9

## 2012-02-28 HISTORY — DX: Gastro-esophageal reflux disease without esophagitis: K21.9

## 2012-02-28 HISTORY — DX: Nausea with vomiting, unspecified: R11.2

## 2012-02-28 LAB — CBC
Hemoglobin: 11.9 g/dL — ABNORMAL LOW (ref 12.0–15.0)
MCH: 30.1 pg (ref 26.0–34.0)
MCHC: 33.2 g/dL (ref 30.0–36.0)

## 2012-02-28 LAB — RPR: RPR Ser Ql: NONREACTIVE

## 2012-02-28 LAB — SURGICAL PCR SCREEN: MRSA, PCR: NEGATIVE

## 2012-02-28 NOTE — Patient Instructions (Addendum)
   Your procedure is scheduled on: Monday, Dec 23  Enter through the [employer] of [facility] at: 1030 am Pick up the phone at the desk and dial [phone] and inform us of your arrival.  Please call this number if you have any problems the morning of surgery: [phone]  Remember: Do not eat food after midnight: Sunday Do not drink clear liquids after: 8 am Monday, Dec 23 Take these medicines the morning of surgery with a SIP OF WATER:  Zantac and zofran if needed  Do not wear jewelry, make-up, or FINGER nail polish No metal in your hair or on your body. Do not wear lotions, powders, perfumes. You may wear deodorant.  Please use your CHG wash as directed prior to surgery.  Do not shave anywhere for at least 12 hours prior to first CHG shower.  Do not bring valuables to the hospital. Contacts, dentures or bridgework may not be worn into surgery.  Leave suitcase in the car. After Surgery it may be brought to your room. For patients being admitted to the hospital, checkout time is 11:00am the day of discharge.  Home with fiance Ihor Austin.

## 2012-03-01 NOTE — H&P (Signed)
26 y.o. G2P1001  Estimated Date of Delivery: 03/09/12 admitted at [redacted] weeks gestation repeat C/S.  Prenatal Transfer Tool  Maternal Diabetes: No Genetic Screening: Declined Maternal Ultrasounds/Referrals: Normal Fetal Ultrasounds or other Referrals:  None Maternal Substance Abuse:  No Significant Maternal Medications:  None Significant Maternal Lab Results: None Other Significant Pregnancy Complications:  None  Afebrile, VSS Heart and Lungs: No active disease Abdomen: soft, gravid, EFW AGA. Cervical exam:  Closed.  Impression: Previous C/S.  Patient requests repeat cesarean delivery.  Plan:  Repeat C/S

## 2012-03-02 ENCOUNTER — Encounter (HOSPITAL_COMMUNITY): Payer: Self-pay | Admitting: Anesthesiology

## 2012-03-02 ENCOUNTER — Inpatient Hospital Stay (HOSPITAL_COMMUNITY)
Admission: RE | Admit: 2012-03-02 | Discharge: 2012-03-04 | DRG: 766 | Disposition: A | Discharge status: Home / Self Care | Payer: Medicaid Other | Source: Ambulatory Visit | Attending: Obstetrics & Gynecology | Admitting: Obstetrics & Gynecology

## 2012-03-02 ENCOUNTER — Encounter (HOSPITAL_COMMUNITY): Payer: Self-pay | Admitting: General Surgery

## 2012-03-02 ENCOUNTER — Inpatient Hospital Stay (HOSPITAL_COMMUNITY): Payer: Medicaid Other | Admitting: Anesthesiology

## 2012-03-02 ENCOUNTER — Encounter (HOSPITAL_COMMUNITY)
Admission: RE | Disposition: A | Discharge status: Home / Self Care | Payer: Self-pay | Source: Ambulatory Visit | Attending: Obstetrics & Gynecology

## 2012-03-02 DIAGNOSIS — Z01812 Encounter for preprocedural laboratory examination: Secondary | ICD-10-CM

## 2012-03-02 DIAGNOSIS — O34219 Maternal care for unspecified type scar from previous cesarean delivery: Principal | ICD-10-CM | POA: Diagnosis present

## 2012-03-02 DIAGNOSIS — Z01818 Encounter for other preprocedural examination: Secondary | ICD-10-CM

## 2012-03-02 LAB — PREPARE RBC (CROSSMATCH)

## 2012-03-02 SURGERY — Surgical Case
Anesthesia: Spinal | Site: Abdomen | Wound class: Clean Contaminated

## 2012-03-02 MED ORDER — SCOPOLAMINE 1 MG/3DAYS TD PT72
MEDICATED_PATCH | TRANSDERMAL | Status: AC
  Administered 2012-03-02: 1.5 mg via TRANSDERMAL
  Filled 2012-03-02: qty 1

## 2012-03-02 MED ORDER — NALBUPHINE HCL 10 MG/ML IJ SOLN
5.0000 mg | INTRAMUSCULAR | Status: DC | PRN
  Filled 2012-03-02: qty 1

## 2012-03-02 MED ORDER — ZOLPIDEM TARTRATE 5 MG PO TABS: 5.0000 mg | ORAL_TABLET | Freq: Every evening | ORAL | Status: DC | PRN

## 2012-03-02 MED ORDER — IBUPROFEN 600 MG PO TABS: 600.0000 mg | ORAL_TABLET | Freq: Four times a day (QID) | ORAL | Status: DC | PRN

## 2012-03-02 MED ORDER — LANOLIN HYDROUS EX OINT: 1.0000 "application " | TOPICAL_OINTMENT | CUTANEOUS | Status: DC | PRN

## 2012-03-02 MED ORDER — OXYCODONE-ACETAMINOPHEN 5-325 MG PO TABS
1.0000 | ORAL_TABLET | ORAL | Status: DC | PRN
  Administered 2012-03-03: 2 via ORAL
  Administered 2012-03-03: 1 via ORAL
  Administered 2012-03-03: 2 via ORAL
  Administered 2012-03-03: 1 via ORAL
  Administered 2012-03-03: 2 via ORAL
  Filled 2012-03-02: qty 1
  Filled 2012-03-02 (×3): qty 2
  Filled 2012-03-02: qty 1

## 2012-03-02 MED ORDER — ONDANSETRON HCL 4 MG/2ML IJ SOLN
4.0000 mg | Freq: Three times a day (TID) | INTRAMUSCULAR | Status: DC | PRN
Start: 2012-03-02 — End: 2012-03-04

## 2012-03-02 MED ORDER — TETANUS-DIPHTH-ACELL PERTUSSIS 5-2.5-18.5 LF-MCG/0.5 IM SUSP: 0.5000 mL | Freq: Once | INTRAMUSCULAR | Status: DC

## 2012-03-02 MED ORDER — DIBUCAINE 1 % RE OINT: 1.0000 "application " | TOPICAL_OINTMENT | RECTAL | Status: DC | PRN

## 2012-03-02 MED ORDER — DIPHENHYDRAMINE HCL 50 MG/ML IJ SOLN: 12.5000 mg | INTRAMUSCULAR | Status: DC | PRN

## 2012-03-02 MED ORDER — KETOROLAC TROMETHAMINE 30 MG/ML IJ SOLN
30.0000 mg | Freq: Four times a day (QID) | INTRAMUSCULAR | Status: AC | PRN
  Administered 2012-03-02: 30 mg via INTRAVENOUS
  Filled 2012-03-02: qty 1

## 2012-03-02 MED ORDER — NALOXONE HCL 0.4 MG/ML IJ SOLN: 0.4000 mg | INTRAMUSCULAR | Status: DC | PRN

## 2012-03-02 MED ORDER — LACTATED RINGERS IV SOLN
INTRAVENOUS | Status: DC | PRN
  Administered 2012-03-02: 13:00:00 via INTRAVENOUS

## 2012-03-02 MED ORDER — HYDROMORPHONE HCL PF 1 MG/ML IJ SOLN
0.2500 mg | INTRAMUSCULAR | Status: DC | PRN
  Administered 2012-03-02 (×2): 0.5 mg via INTRAVENOUS

## 2012-03-02 MED ORDER — SIMETHICONE 80 MG PO CHEW
80.0000 mg | CHEWABLE_TABLET | Freq: Three times a day (TID) | ORAL | Status: DC
  Administered 2012-03-02 – 2012-03-04 (×7): 80 mg via ORAL

## 2012-03-02 MED ORDER — SCOPOLAMINE 1 MG/3DAYS TD PT72
1.0000 | MEDICATED_PATCH | Freq: Once | TRANSDERMAL | Status: DC
  Filled 2012-03-02: qty 1

## 2012-03-02 MED ORDER — BUPIVACAINE IN DEXTROSE 0.75-8.25 % IT SOLN
INTRATHECAL | Status: DC | PRN
  Administered 2012-03-02: 1.6 mL via INTRATHECAL

## 2012-03-02 MED ORDER — OXYTOCIN 10 UNIT/ML IJ SOLN
INTRAMUSCULAR | Status: AC
  Filled 2012-03-02: qty 4

## 2012-03-02 MED ORDER — MORPHINE SULFATE 0.5 MG/ML IJ SOLN
INTRAMUSCULAR | Status: AC
  Filled 2012-03-02: qty 10

## 2012-03-02 MED ORDER — SIMETHICONE 80 MG PO CHEW: 80.0000 mg | CHEWABLE_TABLET | ORAL | Status: DC | PRN

## 2012-03-02 MED ORDER — MENTHOL 3 MG MT LOZG: 1.0000 | LOZENGE | OROMUCOSAL | Status: DC | PRN

## 2012-03-02 MED ORDER — PHENYLEPHRINE 40 MCG/ML (10ML) SYRINGE FOR IV PUSH (FOR BLOOD PRESSURE SUPPORT)
PREFILLED_SYRINGE | INTRAVENOUS | Status: AC
  Filled 2012-03-02: qty 5

## 2012-03-02 MED ORDER — PHENYLEPHRINE HCL 10 MG/ML IJ SOLN
INTRAMUSCULAR | Status: DC | PRN
  Administered 2012-03-02 (×3): 80 ug via INTRAVENOUS
  Administered 2012-03-02: 40 ug via INTRAVENOUS

## 2012-03-02 MED ORDER — KETOROLAC TROMETHAMINE 60 MG/2ML IM SOLN
INTRAMUSCULAR | Status: AC
  Administered 2012-03-02: 60 mg via INTRAMUSCULAR
  Filled 2012-03-02: qty 2

## 2012-03-02 MED ORDER — LACTATED RINGERS IV SOLN
INTRAVENOUS | Status: DC
  Administered 2012-03-03: via INTRAVENOUS

## 2012-03-02 MED ORDER — SCOPOLAMINE 1 MG/3DAYS TD PT72
1.0000 | MEDICATED_PATCH | Freq: Once | TRANSDERMAL | Status: DC
  Administered 2012-03-02: 1.5 mg via TRANSDERMAL

## 2012-03-02 MED ORDER — CEFAZOLIN SODIUM-DEXTROSE 2-3 GM-% IV SOLR: 2.0000 g | Freq: Once | INTRAVENOUS | Status: DC

## 2012-03-02 MED ORDER — MEPERIDINE HCL 25 MG/ML IJ SOLN: 6.2500 mg | INTRAMUSCULAR | Status: DC | PRN

## 2012-03-02 MED ORDER — NALBUPHINE SYRINGE 5 MG/0.5 ML
INJECTION | INTRAMUSCULAR | Status: AC
  Administered 2012-03-02: 10 mg via SUBCUTANEOUS
  Filled 2012-03-02: qty 1

## 2012-03-02 MED ORDER — LACTATED RINGERS IV SOLN
Freq: Once | INTRAVENOUS | Status: AC
  Administered 2012-03-02: 11:00:00 via INTRAVENOUS

## 2012-03-02 MED ORDER — PRENATAL MULTIVITAMIN CH
1.0000 | ORAL_TABLET | Freq: Every day | ORAL | Status: DC
  Filled 2012-03-02: qty 1

## 2012-03-02 MED ORDER — DEXTROSE 5 % IV SOLN
1.0000 ug/kg/h | INTRAVENOUS | Status: DC | PRN
  Filled 2012-03-02: qty 2

## 2012-03-02 MED ORDER — ONDANSETRON HCL 4 MG/2ML IJ SOLN
INTRAMUSCULAR | Status: DC | PRN
  Administered 2012-03-02: 4 mg via INTRAVENOUS

## 2012-03-02 MED ORDER — WITCH HAZEL-GLYCERIN EX PADS: 1.0000 "application " | MEDICATED_PAD | CUTANEOUS | Status: DC | PRN

## 2012-03-02 MED ORDER — ONDANSETRON HCL 4 MG PO TABS: 4.0000 mg | ORAL_TABLET | ORAL | Status: DC | PRN

## 2012-03-02 MED ORDER — FENTANYL CITRATE 0.05 MG/ML IJ SOLN
INTRAMUSCULAR | Status: DC | PRN
  Administered 2012-03-02: 12.5 ug via INTRATHECAL

## 2012-03-02 MED ORDER — KETOROLAC TROMETHAMINE 30 MG/ML IJ SOLN: 30.0000 mg | Freq: Four times a day (QID) | INTRAMUSCULAR | Status: AC | PRN

## 2012-03-02 MED ORDER — OXYTOCIN 40 UNITS IN LACTATED RINGERS INFUSION - SIMPLE MED
INTRAVENOUS | Status: DC | PRN
  Administered 2012-03-02: 40 [IU] via INTRAVENOUS

## 2012-03-02 MED ORDER — DIPHENHYDRAMINE HCL 25 MG PO CAPS: 25.0000 mg | ORAL_CAPSULE | ORAL | Status: DC | PRN

## 2012-03-02 MED ORDER — DIPHENHYDRAMINE HCL 25 MG PO CAPS: 25.0000 mg | ORAL_CAPSULE | Freq: Four times a day (QID) | ORAL | Status: DC | PRN

## 2012-03-02 MED ORDER — OXYTOCIN 40 UNITS IN LACTATED RINGERS INFUSION - SIMPLE MED: 62.5000 mL/h | INTRAVENOUS | Status: AC

## 2012-03-02 MED ORDER — SODIUM CHLORIDE 0.9 % IJ SOLN: 3.0000 mL | INTRAMUSCULAR | Status: DC | PRN

## 2012-03-02 MED ORDER — KETOROLAC TROMETHAMINE 60 MG/2ML IM SOLN
60.0000 mg | Freq: Once | INTRAMUSCULAR | Status: AC | PRN
  Administered 2012-03-02: 60 mg via INTRAMUSCULAR

## 2012-03-02 MED ORDER — CEFAZOLIN SODIUM-DEXTROSE 2-3 GM-% IV SOLR
INTRAVENOUS | Status: AC
  Administered 2012-03-02: 2 g via INTRAVENOUS
  Filled 2012-03-02: qty 50

## 2012-03-02 MED ORDER — SENNOSIDES-DOCUSATE SODIUM 8.6-50 MG PO TABS
2.0000 | ORAL_TABLET | Freq: Every day | ORAL | Status: DC
  Administered 2012-03-02 – 2012-03-03 (×2): 2 via ORAL

## 2012-03-02 MED ORDER — EPHEDRINE 5 MG/ML INJ
INTRAVENOUS | Status: AC
  Filled 2012-03-02: qty 10

## 2012-03-02 MED ORDER — LACTATED RINGERS IV SOLN
INTRAVENOUS | Status: DC | PRN
  Administered 2012-03-02 (×3): via INTRAVENOUS

## 2012-03-02 MED ORDER — DIPHENHYDRAMINE HCL 50 MG/ML IJ SOLN: 25.0000 mg | INTRAMUSCULAR | Status: DC | PRN

## 2012-03-02 MED ORDER — FENTANYL CITRATE 0.05 MG/ML IJ SOLN
INTRAMUSCULAR | Status: AC
  Filled 2012-03-02: qty 2

## 2012-03-02 MED ORDER — ONDANSETRON HCL 4 MG/2ML IJ SOLN
4.0000 mg | INTRAMUSCULAR | Status: DC | PRN
  Administered 2012-03-02: 4 mg via INTRAVENOUS
  Filled 2012-03-02: qty 2

## 2012-03-02 MED ORDER — HYDROMORPHONE HCL PF 1 MG/ML IJ SOLN
INTRAMUSCULAR | Status: AC
  Administered 2012-03-02: 0.5 mg via INTRAVENOUS
  Filled 2012-03-02: qty 1

## 2012-03-02 MED ORDER — MORPHINE SULFATE (PF) 0.5 MG/ML IJ SOLN
INTRAMUSCULAR | Status: DC | PRN
  Administered 2012-03-02: .2 mg via INTRATHECAL

## 2012-03-02 MED ORDER — ONDANSETRON HCL 4 MG/2ML IJ SOLN
INTRAMUSCULAR | Status: AC
  Filled 2012-03-02: qty 2

## 2012-03-02 MED ORDER — METOCLOPRAMIDE HCL 5 MG/ML IJ SOLN: 10.0000 mg | Freq: Three times a day (TID) | INTRAMUSCULAR | Status: DC | PRN

## 2012-03-02 MED ORDER — IBUPROFEN 600 MG PO TABS
600.0000 mg | ORAL_TABLET | Freq: Four times a day (QID) | ORAL | Status: DC
  Administered 2012-03-03 – 2012-03-04 (×7): 600 mg via ORAL
  Filled 2012-03-02 (×6): qty 1

## 2012-03-02 MED ORDER — NALBUPHINE HCL 10 MG/ML IJ SOLN
5.0000 mg | INTRAMUSCULAR | Status: DC | PRN
  Administered 2012-03-03: 5 mg via INTRAVENOUS
  Filled 2012-03-02 (×2): qty 1

## 2012-03-02 SURGICAL SUPPLY — 40 items
CLOTH BEACON ORANGE TIMEOUT ST (SAFETY) ×2 IMPLANT
CONTAINER PREFILL 10% NBF 15ML (MISCELLANEOUS) IMPLANT
DRAPE LG THREE QUARTER DISP (DRAPES) ×2 IMPLANT
DRSG OPSITE POSTOP 4X10 (GAUZE/BANDAGES/DRESSINGS) ×2 IMPLANT
DURAPREP 26ML APPLICATOR (WOUND CARE) ×2 IMPLANT
ELECT REM PT RETURN 9FT ADLT (ELECTROSURGICAL) ×2
ELECTRODE REM PT RTRN 9FT ADLT (ELECTROSURGICAL) ×1 IMPLANT
EXTRACTOR VACUUM M CUP 4 TUBE (SUCTIONS) ×2 IMPLANT
GLOVE BIOGEL PI IND STRL 6.5 (GLOVE) ×2 IMPLANT
GLOVE BIOGEL PI IND STRL 7.0 (GLOVE) ×2 IMPLANT
GLOVE BIOGEL PI INDICATOR 6.5 (GLOVE) ×2
GLOVE BIOGEL PI INDICATOR 7.0 (GLOVE) ×2
GLOVE ECLIPSE 6.0 STRL STRAW (GLOVE) ×10 IMPLANT
GLOVE ECLIPSE 6.5 STRL STRAW (GLOVE) ×2 IMPLANT
GLOVE ECLIPSE STER SZ 5.5 (GLOVE) ×4 IMPLANT
GLOVE SURG SS PI 6.5 STRL IVOR (GLOVE) ×4 IMPLANT
GOWN PREVENTION PLUS LG XLONG (DISPOSABLE) ×6 IMPLANT
KIT ABG SYR 3ML LUER SLIP (SYRINGE) IMPLANT
NEEDLE HYPO 25X5/8 SAFETYGLIDE (NEEDLE) ×2 IMPLANT
NS IRRIG 1000ML POUR BTL (IV SOLUTION) ×2 IMPLANT
PACK C SECTION WH (CUSTOM PROCEDURE TRAY) ×2 IMPLANT
PAD OB MATERNITY 4.3X12.25 (PERSONAL CARE ITEMS) IMPLANT
RTRCTR C-SECT PINK 25CM LRG (MISCELLANEOUS) ×2 IMPLANT
SLEEVE SCD COMPRESS KNEE MED (MISCELLANEOUS) IMPLANT
STAPLER VISISTAT 35W (STAPLE) ×2 IMPLANT
SUT PLAIN 0 NONE (SUTURE) IMPLANT
SUT PLAIN 2 0 XLH (SUTURE) ×2 IMPLANT
SUT VIC AB 0 CT1 27 (SUTURE) ×3
SUT VIC AB 0 CT1 27XBRD ANBCTR (SUTURE) ×3 IMPLANT
SUT VIC AB 1 CTX 36 (SUTURE) ×2
SUT VIC AB 1 CTX36XBRD ANBCTRL (SUTURE) ×2 IMPLANT
SUT VIC AB 3-0 CT1 27 (SUTURE) ×1
SUT VIC AB 3-0 CT1 TAPERPNT 27 (SUTURE) ×1 IMPLANT
SUT VIC AB 3-0 PS2 18 (SUTURE)
SUT VIC AB 3-0 PS2 18XBRD (SUTURE) IMPLANT
SUT VIC AB 3-0 SH 27 (SUTURE)
SUT VIC AB 3-0 SH 27X BRD (SUTURE) IMPLANT
TOWEL OR 17X24 6PK STRL BLUE (TOWEL DISPOSABLE) ×6 IMPLANT
TRAY FOLEY CATH 14FR (SET/KITS/TRAYS/PACK) ×2 IMPLANT
WATER STERILE IRR 1000ML POUR (IV SOLUTION) ×2 IMPLANT

## 2012-03-02 NOTE — Anesthesia Postprocedure Evaluation (Signed)
Anesthesia Post Note  Patient: Lori Callahan  Procedure(s) Performed: Procedure(s) (LRB): CESAREAN SECTION (N/A)  Anesthesia type: Spinal  Patient location: PACU  Post pain: Pain level controlled  Post assessment: Post-op Vital signs reviewed  Last Vitals:  Filed Vitals:   03/02/12 1400  BP: 107/65  Pulse: 82  Temp:   Resp: 20    Post vital signs: Reviewed  Level of consciousness: awake  Complications: No apparent anesthesia complications

## 2012-03-02 NOTE — Progress Notes (Signed)
I have interviewed and performed the pertinent exams on my patient to confirm that there have been no significant changes in her condition since the dictation of her history and physical exam.  

## 2012-03-02 NOTE — Transfer of Care (Signed)
Immediate Anesthesia Transfer of Care Note  Patient: Lori Callahan  Procedure(s) Performed: Procedure(s) (LRB) with comments: CESAREAN SECTION (N/A)  Patient Location: PACU  Anesthesia Type:Spinal  Level of Consciousness: awake, alert  and oriented  Airway & Oxygen Therapy: Patient Spontanous Breathing  Post-op Assessment: Report given to PACU RN and Post -op Vital signs reviewed and stable  Post vital signs: Reviewed and stable  Complications: No apparent anesthesia complications

## 2012-03-02 NOTE — Addendum Note (Signed)
Addendum  created 03/02/12 1656 by Elbert Ewings, CRNA   Modules edited:Notes Section

## 2012-03-02 NOTE — Op Note (Signed)
Patient Name: Lori Callahan MRN: [id number]  Date of Surgery: 03/02/2012    PREOPERATIVE DIAGNOSIS: REPEAT  POSTOPERATIVE DIAGNOSIS: REPEAT   PROCEDURE: Low transverse cesarean section  SURGEON: Caralyn Guile. Arlyce Dice M.D.  ASSISTANT: Waynard Reeds, M.D.  ANESTHESIA: Spinal  ESTIMATED BLOOD LOSS: 800 ml  FINDINGS: Female, Apgar 9,9; BW ; Clear fluid, normal uterus and adnexa.    INDICATIONS: This is a 26 y.o.  Gravida 2 para 1001 who is admitted for repeat cesarean delivery at 39 weeks at patient request.  PROCEDURE IN DETAIL: The patient was taken to the operating room and spinal anesthesia was placed.  She was then placed in the supine position with left lateral displacement of the uterus. The abdomen was prepped and draped in a sterile fashion and the bladder was catheterized.  A low transverse abdominal incision was made and carried down to the fascia. The fascia was opened transversely and the rectus sheath was dissected from the underlying rectus muscle. The rectus midline was identified and opened by sharp and blunt dissection. The peritoneum was opened. An Alexis retractor was placed and the lower uterine segment was identified, entered transversely by careful sharp dissection, and extended bluntly.  The infant was delivered without difficulty. The placenta was sent for cord blood collection. The uterus was bluntly curettage. The lower segment was closed with running interlocking Vicryl 1 suture.  Hemostasis was obtained with vertical mattress sutures. The peritoneum and rectus muscle were closed in the midline with running 3-0 Vicryl suture. The fascia was closed with running 0 Vicryl suture and the subcutaneous tissue was closed with 2-0 plain gut.  The skin was closed with staples. All sponge and instrument counts were correct.  The patient tolerated the procedure well and left the operating room in good condition.

## 2012-03-02 NOTE — Anesthesia Preprocedure Evaluation (Signed)
Anesthesia Evaluation  Patient identified by MRN, date of birth, ID band Patient awake    Reviewed: Allergy & Precautions, H&P , Patient's Chart, lab work & pertinent test results  Airway Mallampati: II TM Distance: >3 FB Neck ROM: full    Dental No notable dental hx.    Pulmonary  breath sounds clear to auscultation  Pulmonary exam normal       Cardiovascular Exercise Tolerance: Good Rhythm:regular Rate:Normal     Neuro/Psych    GI/Hepatic   Endo/Other  Morbid obesity  Renal/GU      Musculoskeletal   Abdominal   Peds  Hematology   Anesthesia Other Findings   Reproductive/Obstetrics                           Anesthesia Physical Anesthesia Plan  ASA: III  Anesthesia Plan: Spinal   Post-op Pain Management:    Induction:   Airway Management Planned:   Additional Equipment:   Intra-op Plan:   Post-operative Plan:   Informed Consent: I have reviewed the patients History and Physical, chart, labs and discussed the procedure including the risks, benefits and alternatives for the proposed anesthesia with the patient or authorized representative who has indicated his/her understanding and acceptance.   Dental Advisory Given  Plan Discussed with: CRNA  Anesthesia Plan Comments: (Platelets okay. Discussed spinal anesthetic, and patient consents to the procedure:  included risk of possible headache,backache, failed block, allergic reaction, and nerve injury. This patient was asked if she had any questions or concerns before the procedure started. )        Anesthesia Quick Evaluation

## 2012-03-02 NOTE — Anesthesia Postprocedure Evaluation (Signed)
  Anesthesia Post-op Note  Patient: Lori Callahan  Procedure(s) Performed: Procedure(s) (LRB) with comments: CESAREAN SECTION (N/A)  Patient Location: PACU and Mother/Baby  Anesthesia Type:Spinal  Level of Consciousness: awake, alert  and oriented  Airway and Oxygen Therapy: Patient Spontanous Breathing  Post-op Pain: mild  Post-op Assessment: Patient's Cardiovascular Status Stable, Respiratory Function Stable and No signs of Nausea or vomiting  Post-op Vital Signs: stable  Complications: No apparent anesthesia complications

## 2012-03-02 NOTE — Anesthesia Procedure Notes (Signed)
Spinal  Patient location during procedure: OR Start time: 03/02/2012 12:14 PM End time: 03/02/2012 12:17 PM Staffing Anesthesiologist: Sandrea Hughs Performed by: anesthesiologist  Preanesthetic Checklist Completed: patient identified, site marked, surgical consent, pre-op evaluation, timeout performed, IV checked, risks and benefits discussed and monitors and equipment checked Spinal Block Patient position: sitting Prep: DuraPrep Patient monitoring: heart rate, cardiac monitor, continuous pulse ox and blood pressure Approach: midline Location: L3-4 Injection technique: single-shot Needle Needle type: Sprotte  Needle gauge: 24 G Needle length: 9 cm Catheter at skin depth: 7 cm Assessment Sensory level: T6

## 2012-03-03 LAB — TYPE AND SCREEN

## 2012-03-03 LAB — CBC
HCT: 30.1 % — ABNORMAL LOW (ref 36.0–46.0)
Hemoglobin: 10.1 g/dL — ABNORMAL LOW (ref 12.0–15.0)
MCHC: 33.6 g/dL (ref 30.0–36.0)
MCV: 90.9 fL (ref 78.0–100.0)
RDW: 13.5 % (ref 11.5–15.5)

## 2012-03-03 NOTE — Progress Notes (Signed)
Subjective: Postpartum Day 1: Cesarean Delivery Patient reports pain controlled, bleeding appropriate. No N/V  Objective: Vital signs in last 24 hours: Temp:  [97.7 F (36.5 C)-99 F (37.2 C)] 98.1 F (36.7 C) (12/24 0817) Pulse Rate:  [79-110] 86  (12/24 0817) Resp:  [14-20] 18  (12/24 0817) BP: (92-129)/(47-83) 108/74 mmHg (12/24 0817) SpO2:  [97 %-100 %] 97 % (12/24 0817) Weight:  [99.791 kg (220 lb)] 99.791 kg (220 lb) (12/23 1548)  Physical Exam:  General: alert, cooperative and appears stated age Lochia: appropriate Uterine Fundus: firm Incision: healing well DVT Evaluation: No evidence of DVT seen on physical exam.   Basename 03/03/12 0503  HGB 10.1*  HCT 30.1*    Assessment/Plan: Status post Cesarean section. Doing well postoperatively.  Continue current care. Desires Neonatal circ. R/B/A reviewed with patient and husband.  Will proceed.  Tobey Schmelzle H. 03/03/2012, 9:50 AM

## 2012-03-03 NOTE — Clinical Social Work Psychosocial (Signed)
     Clinical Social Work Department BRIEF PSYCHOSOCIAL ASSESSMENT 03/03/2012  Patient:  SMITH,Tammy A     Account Number:  [id number]     Admit date:  03/02/2012  Clinical Social Worker:  Robin Searing  Date/Time:  03/03/2012 10:46 AM  Referred by:  Physician  Date Referred:  03/03/2012 Referred for  Psychosocial assessment   Other Referral:   Interview type:  Patient Other interview type:    PSYCHOSOCIAL DATA Living Status:  FAMILY Admitted from facility:   Level of care:   Primary support name:  fiance and extended family Primary support relationship to patient:   Degree of support available:   good    CURRENT CONCERNS Current Concerns  Other - See comment   Other Concerns:   MOB with history of anxiety and depression    SOCIAL WORK ASSESSMENT / PLAN Met with MOB who states she lives with her fiance and her 7yo child. SHe has a hx of anxiety/depression and states this is in the past and she has not needed any RX. Discussed signs/symptoms to watch for related to depression/anxiety and PPD- she understands and denies any current concerns or needs/   Assessment/plan status:  No Further Intervention Required Other assessment/ plan:   MOB eager to take baby home- she reports having all needed supplies for the baby and is on Medicaid, food stamps and WIC.   Information/referral to community resources:   DSS  [facility]    PATIENTS/FAMILYS RESPONSE TO PLAN OF CARE: Patient receptive to CSW visit and conversation- she appears happy and comfortable with baby- reports good suppor from friends, family and FOB/fiance

## 2012-03-04 ENCOUNTER — Encounter (HOSPITAL_COMMUNITY): Payer: Self-pay | Admitting: Obstetrics & Gynecology

## 2012-03-04 MED ORDER — HYDROMORPHONE HCL 4 MG PO TABS: 4.0000 mg | ORAL_TABLET | ORAL | Status: DC | PRN

## 2012-03-04 MED ORDER — HYDROMORPHONE HCL 2 MG PO TABS
4.0000 mg | ORAL_TABLET | ORAL | Status: DC | PRN
  Administered 2012-03-04 (×2): 4 mg via ORAL
  Filled 2012-03-04 (×2): qty 2

## 2012-03-04 NOTE — Discharge Summary (Signed)
Obstetric Discharge Summary Reason for Admission: cesarean section Prenatal Procedures: none Intrapartum Procedures: cesarean: low cervical, transverse Postpartum Procedures: none Complications-Operative and Postpartum: none Hemoglobin  Date Value Range Status  03/03/2012 10.1* 12.0 - 15.0 g/dL Final     HCT  Date Value Range Status  03/03/2012 30.1* 36.0 - 46.0 % Final    Physical Exam:  General: alert Lochia: appropriate Uterine Fundus: firm Incision: healing well DVT Evaluation: No evidence of DVT seen on physical exam.  Discharge Diagnoses: Term Pregnancy-delivered, Repeat cesarean.  Discharge Information: Date: 03/04/2012 Activity: limited Diet: routine Medications: PNV, Ibuprofen and Dilaudid Condition: stable Instructions: refer to practice specific booklet Discharge to: home Follow-up Information    Follow up with Mickel Baas, MD. Schedule an appointment as soon as possible for a visit in 4 weeks.   Contact information:   [address] [place] [address] [phone]          Newborn Data: Live born female  Birth Weight: 6 lb 14.8 oz (3141 g) APGAR: 9, 9  Home with mother.  Lori Callahan D 03/04/2012, 10:05 AM

## 2012-06-21 ENCOUNTER — Emergency Department (HOSPITAL_COMMUNITY): Admission: EM | Admit: 2012-06-21 | Payer: Self-pay | Source: Home / Self Care

## 2012-06-21 MED ORDER — FAMOTIDINE IN NACL 20-0.9 MG/50ML-% IV SOLN: 20.0000 mg | Freq: Once | INTRAVENOUS | Status: DC

## 2012-06-21 MED ORDER — DIPHENHYDRAMINE HCL 50 MG/ML IJ SOLN: 50.0000 mg | Freq: Once | INTRAMUSCULAR | Status: DC

## 2012-06-21 MED ORDER — METHYLPREDNISOLONE SODIUM SUCC 125 MG IJ SOLR: 125.0000 mg | Freq: Once | INTRAMUSCULAR | Status: DC

## 2012-06-21 NOTE — ED Notes (Deleted)
Pt reports stung by insect 2 hours ago, possibly hornet, on right shoulder and upper right back. Onset hives, shortness of breath, 30 minutes after sting. Given 10 mg Benadryl PO at home. Airway patent.

## 2014-01-10 ENCOUNTER — Encounter (HOSPITAL_COMMUNITY): Payer: Self-pay | Admitting: Obstetrics & Gynecology

## 2016-03-29 LAB — OB RESULTS CONSOLE ABO/RH: RH TYPE: POSITIVE

## 2016-03-29 LAB — OB RESULTS CONSOLE GC/CHLAMYDIA
Chlamydia: NEGATIVE
Gonorrhea: NEGATIVE

## 2016-03-29 LAB — OB RESULTS CONSOLE HIV ANTIBODY (ROUTINE TESTING): HIV: NONREACTIVE

## 2016-03-29 LAB — OB RESULTS CONSOLE ANTIBODY SCREEN: Antibody Screen: NEGATIVE

## 2016-03-29 LAB — OB RESULTS CONSOLE HEPATITIS B SURFACE ANTIGEN: Hepatitis B Surface Ag: NEGATIVE

## 2016-03-29 LAB — OB RESULTS CONSOLE RUBELLA ANTIBODY, IGM: RUBELLA: IMMUNE

## 2016-03-29 LAB — OB RESULTS CONSOLE RPR: RPR: NONREACTIVE

## 2016-04-04 ENCOUNTER — Other Ambulatory Visit: Payer: Self-pay | Admitting: Obstetrics & Gynecology

## 2016-04-04 ENCOUNTER — Other Ambulatory Visit (HOSPITAL_COMMUNITY)
Admission: RE | Admit: 2016-04-04 | Discharge: 2016-04-04 | Disposition: A | Discharge status: Home / Self Care | Payer: BLUE CROSS/BLUE SHIELD | Source: Ambulatory Visit | Attending: Obstetrics & Gynecology | Admitting: Obstetrics & Gynecology

## 2016-04-04 DIAGNOSIS — Z113 Encounter for screening for infections with a predominantly sexual mode of transmission: Secondary | ICD-10-CM | POA: Diagnosis present

## 2016-04-04 DIAGNOSIS — Z1151 Encounter for screening for human papillomavirus (HPV): Secondary | ICD-10-CM | POA: Insufficient documentation

## 2016-04-04 DIAGNOSIS — Z01419 Encounter for gynecological examination (general) (routine) without abnormal findings: Secondary | ICD-10-CM | POA: Insufficient documentation

## 2016-04-09 LAB — CYTOLOGY - PAP
Chlamydia: NEGATIVE
Diagnosis: NEGATIVE
HPV: NOT DETECTED
Neisseria Gonorrhea: NEGATIVE

## 2016-06-04 ENCOUNTER — Other Ambulatory Visit (HOSPITAL_COMMUNITY): Payer: Self-pay | Admitting: Obstetrics & Gynecology

## 2016-06-04 ENCOUNTER — Encounter (HOSPITAL_COMMUNITY): Payer: Self-pay

## 2016-06-04 DIAGNOSIS — Z3689 Encounter for other specified antenatal screening: Secondary | ICD-10-CM

## 2016-06-04 DIAGNOSIS — Z3A19 19 weeks gestation of pregnancy: Secondary | ICD-10-CM

## 2016-06-04 DIAGNOSIS — Q909 Down syndrome, unspecified: Secondary | ICD-10-CM

## 2016-06-05 ENCOUNTER — Encounter (HOSPITAL_COMMUNITY): Payer: Self-pay

## 2016-06-05 ENCOUNTER — Ambulatory Visit (HOSPITAL_COMMUNITY)
Admission: RE | Admit: 2016-06-05 | Discharge: 2016-06-05 | Disposition: A | Discharge status: Home / Self Care | Payer: BLUE CROSS/BLUE SHIELD | Source: Ambulatory Visit | Attending: Obstetrics & Gynecology | Admitting: Obstetrics & Gynecology

## 2016-06-05 ENCOUNTER — Other Ambulatory Visit (HOSPITAL_COMMUNITY): Payer: Self-pay | Admitting: Obstetrics & Gynecology

## 2016-06-05 DIAGNOSIS — O283 Abnormal ultrasonic finding on antenatal screening of mother: Secondary | ICD-10-CM | POA: Insufficient documentation

## 2016-06-05 DIAGNOSIS — Z3A19 19 weeks gestation of pregnancy: Secondary | ICD-10-CM | POA: Diagnosis not present

## 2016-06-05 DIAGNOSIS — O280 Abnormal hematological finding on antenatal screening of mother: Secondary | ICD-10-CM

## 2016-06-05 DIAGNOSIS — Z363 Encounter for antenatal screening for malformations: Secondary | ICD-10-CM | POA: Insufficient documentation

## 2016-06-05 DIAGNOSIS — Z3689 Encounter for other specified antenatal screening: Secondary | ICD-10-CM

## 2016-06-05 DIAGNOSIS — O34211 Maternal care for low transverse scar from previous cesarean delivery: Secondary | ICD-10-CM | POA: Insufficient documentation

## 2016-06-05 DIAGNOSIS — Z315 Encounter for genetic counseling: Secondary | ICD-10-CM | POA: Diagnosis not present

## 2016-06-05 DIAGNOSIS — O289 Unspecified abnormal findings on antenatal screening of mother: Secondary | ICD-10-CM | POA: Insufficient documentation

## 2016-06-05 DIAGNOSIS — Q909 Down syndrome, unspecified: Secondary | ICD-10-CM

## 2016-06-05 NOTE — Progress Notes (Signed)
  Genetic Counseling  DOB: December 28, 1985 Referring Provider: Janyth Pupa, DO Appointment Date: 06/05/2016 Attending: Dr. Griffin Callahan  [name] and her husband [name], were seen for genetic counseling because of an increased risk for fetal Down syndrome based on a maternal serum Quad screen.  In summary:  Reviewed results of screening test  Increased risk for Down syndrome  Discussed additional screening options  NIPS - declined  Ultrasound - see separate report  Discussed diagnostic testing options  Amniocentesis - declined  Reviewed family history - no concerns reported  Discussed general population carrier screening options - declined  CF  SMA  Hemoglobinopathies  They were counseled regarding the screening result and the associated 1 in 70 risk for fetal Down syndrome.  We reviewed chromosomes, nondisjunction, and the common features and variable prognosis of Down syndrome.  In addition, we reviewed the screen adjusted reduction in risks for trisomy 18 and open neural tube defects.  We also discussed other explanations for a screen positive result including: a gestational dating error, differences in maternal metabolism, and normal variation.   A complete ultrasound was performed today. The ultrasound report will be sent under separate cover. They were counseled that 50-80% of fetuses with Down syndrome will have a detectable anomaly or soft marker of aneuploidy on targeted ultrasound. There were no visualized fetal anomalies or markers suggestive of aneuploidy.  We reviewed the option of noninvasive prenatal screening (NIPS)/cell free DNA (cfDNA) screening. Specifically, we discussed the conditions for which the test screens, the detection rates, and false positive rates for each. They were also counseled regarding diagnostic testing via amniocentesis. We reviewed the approximate 1 in [phone] risk for complications from amniocentesis, including  spontaneous pregnancy loss. We discussed the possible results that the tests might provide including: positive, negative, unanticipated, and no result. Finally, they were counseled regarding the cost of each option and potential out of pocket expenses. After consideration of all the options, they declined additional testing or screening today.  They understand that screening tests cannot rule out all birth defects or genetic syndromes. The patient was advised of this limitation and states she still does not want additional testing at this time.   [name] was provided with written information regarding cystic fibrosis (CF), spinal muscular atrophy (SMA) and hemoglobinopathies including the carrier frequency, availability of carrier screening and prenatal diagnosis if indicated.  In addition, we discussed that CF and hemoglobinopathies are routinely screened for as part of the Lori Callahan newborn screening panel.  After further discussion, she declined screening for CF, SMA and hemoglobinopathies.   Both family histories were reviewed and found to be noncontributory for birth defects, intellectual disabilities or known genetic conditions. Without further information regarding the provided family history, an accurate genetic risk cannot be calculated. Further genetic counseling is warranted if more information is obtained.  [name] denied exposure to environmental toxins or chemical agents. She denied the use of alcohol, tobacco or street drugs. She denied significant viral illnesses during the course of her pregnancy. Her medical and surgical histories were noncontributory.   I counseled this couple for approximately 45 minutes regarding the above risks and available options.   Lori Hai, MS,  Certified Genetic Counselor

## 2016-06-20 ENCOUNTER — Ambulatory Visit (HOSPITAL_COMMUNITY): Payer: BLUE CROSS/BLUE SHIELD

## 2016-10-09 ENCOUNTER — Telehealth (HOSPITAL_COMMUNITY): Payer: Self-pay | Admitting: *Deleted

## 2016-10-09 NOTE — Telephone Encounter (Signed)
Preadmission screen  

## 2016-10-10 ENCOUNTER — Telehealth (HOSPITAL_COMMUNITY): Payer: Self-pay | Admitting: *Deleted

## 2016-10-10 NOTE — Telephone Encounter (Signed)
Preadmission screen  

## 2016-10-11 ENCOUNTER — Encounter (HOSPITAL_COMMUNITY): Payer: Self-pay

## 2016-10-18 ENCOUNTER — Other Ambulatory Visit: Payer: Self-pay | Admitting: Obstetrics and Gynecology

## 2016-10-18 ENCOUNTER — Encounter (HOSPITAL_COMMUNITY): Payer: Self-pay

## 2016-10-18 ENCOUNTER — Inpatient Hospital Stay (HOSPITAL_COMMUNITY)
Admission: AD | Admit: 2016-10-18 | Discharge: 2016-10-18 | Disposition: A | Discharge status: Home / Self Care | Payer: Medicaid Other | Source: Ambulatory Visit | Attending: Obstetrics & Gynecology | Admitting: Obstetrics & Gynecology

## 2016-10-18 DIAGNOSIS — F419 Anxiety disorder, unspecified: Secondary | ICD-10-CM | POA: Insufficient documentation

## 2016-10-18 DIAGNOSIS — K219 Gastro-esophageal reflux disease without esophagitis: Secondary | ICD-10-CM | POA: Diagnosis not present

## 2016-10-18 DIAGNOSIS — O99343 Other mental disorders complicating pregnancy, third trimester: Secondary | ICD-10-CM | POA: Insufficient documentation

## 2016-10-18 DIAGNOSIS — O34219 Maternal care for unspecified type scar from previous cesarean delivery: Principal | ICD-10-CM | POA: Insufficient documentation

## 2016-10-18 DIAGNOSIS — Z3A39 39 weeks gestation of pregnancy: Secondary | ICD-10-CM | POA: Diagnosis not present

## 2016-10-18 DIAGNOSIS — O99613 Diseases of the digestive system complicating pregnancy, third trimester: Secondary | ICD-10-CM | POA: Insufficient documentation

## 2016-10-18 DIAGNOSIS — O471 False labor at or after 37 completed weeks of gestation: Secondary | ICD-10-CM

## 2016-10-18 LAB — URINALYSIS, ROUTINE W REFLEX MICROSCOPIC
BILIRUBIN URINE: NEGATIVE
Glucose, UA: NEGATIVE mg/dL
HGB URINE DIPSTICK: NEGATIVE
Ketones, ur: NEGATIVE mg/dL
Leukocytes, UA: NEGATIVE
Nitrite: NEGATIVE
PROTEIN: NEGATIVE mg/dL
Specific Gravity, Urine: 1.013 (ref 1.005–1.030)
pH: 6 (ref 5.0–8.0)

## 2016-10-18 NOTE — H&P (Signed)
HPI: 31 y/o G3P2002 @ [redacted]w[redacted]d estimated gestational age (as dated by LMP c/w 20 week ultrasound) presents for scheduled repeat C-section and tubal ligation.   no Leaking of Fluid,   no Vaginal Bleeding,   no Uterine Contractions,  + Fetal Movement.  Prenatal care has been provided by [facility]- Dr. Nelda Marseille  ROS: no HA, no epigastric pain, no visual changes.    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   Prenatal Transfer Tool  Maternal Diabetes: No Genetic Screening: Normal Maternal Ultrasounds/Referrals: Normal Fetal Ultrasounds or other Referrals:  None Maternal Substance Abuse:  No Significant Maternal Medications:  Meds include: Other: zantac, valtrex Significant Maternal Lab Results: Lab values include: Group B Strep negative   PNL:  GBS neg, Rub Immune, Hep B neg, RPR NR, HIV neg, GC/C neg, glucola:120 Hgb: 11.7 Blood type: B positive  Immunizations: Tdap: declined OBHx: FT C-section x 2- 7#, both males PMHx:  Anxiety, GERD Meds:  PNV, zantac valtrex Allergy:   Allergies  Allergen Reactions  . Reglan [Metoclopramide] Shortness Of Breath   SurgHx: C-section x 2 SocHx:   no Tobacco, no  EtOH, no Illicit Drugs  O: LMP 44/31/5400  Gen. AAOx3, NAD CV.  RRR  No murmur.  Resp. CTAB, no wheeze or crackles. Abd. Gravid,  no tenderness,  no rigidity,  no guarding Extr.  1+ edema B/L , no calf tenderness, neg Homan's B/L  FHT: 130 bpm SVE: closed/long/high   Labs: see orders  A/P:  31 y.o. Q6P6195 @ [redacted]w[redacted]d EGA who presents for repeat C-section and tubal ligation -FWB:  Reassuring by doppler -NPO -LR @ 125cc/hr -SCDs to OR -Ancef 2g IV -Risk benefits and alternatives of cesarean section with tubal ligation were discussed with the patient including but not limited to infection, bleeding, damage to bowel , bladder and baby with the need for further surgery. Pt voiced understanding and  desires to proceed.   Janyth Pupa, DO [phone] (pager) [phone] (office)

## 2016-10-18 NOTE — MAU Note (Signed)
Pt was seen by Dr. Simona Huh and checked. 0/thick and high. She would like RN to recheck pt in 1.5-2 hrs.

## 2016-10-18 NOTE — MAU Note (Signed)
Having a lot of abd cramping and pressure in lower abd. Comes and goes. Different than usual discomfort I have been having. Denies LOF or bleeding. For third c/s on 10/22/16

## 2016-10-21 ENCOUNTER — Encounter (HOSPITAL_COMMUNITY)
Admission: RE | Admit: 2016-10-21 | Discharge: 2016-10-21 | Disposition: A | Discharge status: Home / Self Care | Payer: Medicaid Other | Source: Ambulatory Visit | Attending: Obstetrics & Gynecology | Admitting: Obstetrics & Gynecology

## 2016-10-21 ENCOUNTER — Other Ambulatory Visit (HOSPITAL_COMMUNITY): Payer: BLUE CROSS/BLUE SHIELD

## 2016-10-21 DIAGNOSIS — Z01818 Encounter for other preprocedural examination: Secondary | ICD-10-CM

## 2016-10-21 LAB — CBC
HEMATOCRIT: 33.7 % — AB (ref 36.0–46.0)
HEMOGLOBIN: 11.3 g/dL — AB (ref 12.0–15.0)
MCH: 30.1 pg (ref 26.0–34.0)
MCHC: 33.5 g/dL (ref 30.0–36.0)
MCV: 89.9 fL (ref 78.0–100.0)
Platelets: 201 10*3/uL (ref 150–400)
RBC: 3.75 MIL/uL — ABNORMAL LOW (ref 3.87–5.11)
RDW: 14.2 % (ref 11.5–15.5)
WBC: 11.6 10*3/uL — ABNORMAL HIGH (ref 4.0–10.5)

## 2016-10-21 LAB — TYPE AND SCREEN
ABO/RH(D): B POS
Antibody Screen: NEGATIVE

## 2016-10-21 NOTE — Patient Instructions (Signed)
[place]  10/21/2016   Your procedure is scheduled on:  10/22/2016   Enter through the Main Entrance of [facility] up the phone at the desk and dial [phone].   Call this number if you have problems the morning of surgery: [phone]   Remember:   Do not eat food:After Midnight.  Do not drink clear liquids: After Midnight.  Take these medicines the morning of surgery with A SIP OF WATER: may take your zantac   Do not wear jewelry, make-up or nail polish.  Do not wear lotions, powders, or perfumes. Do not wear deodorant.  Do not shave 48 hours prior to surgery.  Do not bring valuables to the hospital.  [facility] is not   responsible for any belongings or valuables brought to the hospital.  Contacts, dentures or bridgework may not be worn into surgery.  Leave suitcase in the car. After surgery it may be brought to your room.  For patients admitted to the hospital, checkout time is 11:00 AM the day of              discharge.   Patients discharged the day of surgery will not be allowed to drive             home.  Name and phone number of your driver: na  Special Instructions:   N/A   Please read over the following fact sheets that you were given:   Surgical Site Infection Prevention

## 2016-10-22 ENCOUNTER — Encounter (HOSPITAL_COMMUNITY): Payer: Self-pay

## 2016-10-22 ENCOUNTER — Encounter (HOSPITAL_COMMUNITY)
Admission: RE | Disposition: A | Discharge status: Home / Self Care | Payer: Self-pay | Source: Ambulatory Visit | Attending: Obstetrics & Gynecology

## 2016-10-22 ENCOUNTER — Inpatient Hospital Stay (HOSPITAL_COMMUNITY)
Admission: RE | Admit: 2016-10-22 | Discharge: 2016-10-24 | DRG: 765 | Disposition: A | Discharge status: Home / Self Care | Payer: Medicaid Other | Source: Ambulatory Visit | Attending: Obstetrics & Gynecology | Admitting: Obstetrics & Gynecology

## 2016-10-22 ENCOUNTER — Inpatient Hospital Stay (HOSPITAL_COMMUNITY): Payer: Medicaid Other | Admitting: Anesthesiology

## 2016-10-22 DIAGNOSIS — Z302 Encounter for sterilization: Secondary | ICD-10-CM

## 2016-10-22 DIAGNOSIS — Z3A39 39 weeks gestation of pregnancy: Secondary | ICD-10-CM

## 2016-10-22 DIAGNOSIS — O9962 Diseases of the digestive system complicating childbirth: Secondary | ICD-10-CM | POA: Diagnosis present

## 2016-10-22 DIAGNOSIS — O34211 Maternal care for low transverse scar from previous cesarean delivery: Secondary | ICD-10-CM | POA: Diagnosis present

## 2016-10-22 DIAGNOSIS — A6000 Herpesviral infection of urogenital system, unspecified: Secondary | ICD-10-CM | POA: Diagnosis present

## 2016-10-22 DIAGNOSIS — O9832 Other infections with a predominantly sexual mode of transmission complicating childbirth: Secondary | ICD-10-CM | POA: Diagnosis present

## 2016-10-22 DIAGNOSIS — K219 Gastro-esophageal reflux disease without esophagitis: Secondary | ICD-10-CM | POA: Diagnosis present

## 2016-10-22 DIAGNOSIS — Z3493 Encounter for supervision of normal pregnancy, unspecified, third trimester: Secondary | ICD-10-CM

## 2016-10-22 DIAGNOSIS — O99214 Obesity complicating childbirth: Secondary | ICD-10-CM | POA: Diagnosis present

## 2016-10-22 DIAGNOSIS — Z6841 Body Mass Index (BMI) 40.0 and over, adult: Secondary | ICD-10-CM

## 2016-10-22 HISTORY — PX: TUBAL LIGATION: SHX77

## 2016-10-22 LAB — RPR: RPR: NONREACTIVE

## 2016-10-22 SURGERY — Surgical Case
Anesthesia: Regional

## 2016-10-22 SURGERY — LIGATION, FALLOPIAN TUBE, POSTPARTUM
Anesthesia: Spinal

## 2016-10-22 MED ORDER — BUPIVACAINE IN DEXTROSE 0.75-8.25 % IT SOLN
INTRATHECAL | Status: DC | PRN
  Administered 2016-10-22: 12 mL via INTRATHECAL

## 2016-10-22 MED ORDER — OXYCODONE HCL 5 MG PO TABS
5.0000 mg | ORAL_TABLET | ORAL | Status: DC | PRN
  Administered 2016-10-22: 5 mg via ORAL
  Filled 2016-10-22: qty 1

## 2016-10-22 MED ORDER — SIMETHICONE 80 MG PO CHEW
80.0000 mg | CHEWABLE_TABLET | ORAL | Status: DC
  Administered 2016-10-22 – 2016-10-23 (×2): 80 mg via ORAL
  Filled 2016-10-22 (×4): qty 1

## 2016-10-22 MED ORDER — FENTANYL CITRATE (PF) 100 MCG/2ML IJ SOLN
INTRAMUSCULAR | Status: AC
  Filled 2016-10-22: qty 2

## 2016-10-22 MED ORDER — MEPERIDINE HCL 25 MG/ML IJ SOLN: 6.2500 mg | INTRAMUSCULAR | Status: DC | PRN

## 2016-10-22 MED ORDER — DEXAMETHASONE SODIUM PHOSPHATE 10 MG/ML IJ SOLN
INTRAMUSCULAR | Status: AC
  Filled 2016-10-22: qty 1

## 2016-10-22 MED ORDER — PHENYLEPHRINE 8 MG IN D5W 100 ML (0.08MG/ML) PREMIX OPTIME
INJECTION | INTRAVENOUS | Status: AC
  Filled 2016-10-22: qty 100

## 2016-10-22 MED ORDER — DEXAMETHASONE SODIUM PHOSPHATE 4 MG/ML IJ SOLN
INTRAMUSCULAR | Status: DC | PRN
  Administered 2016-10-22: 4 mg via INTRAVENOUS

## 2016-10-22 MED ORDER — NALBUPHINE HCL 10 MG/ML IJ SOLN: 5.0000 mg | INTRAMUSCULAR | Status: DC | PRN

## 2016-10-22 MED ORDER — MORPHINE SULFATE (PF) 0.5 MG/ML IJ SOLN
INTRAMUSCULAR | Status: DC | PRN
  Administered 2016-10-22: .2 mg via INTRATHECAL

## 2016-10-22 MED ORDER — PROMETHAZINE HCL 25 MG/ML IJ SOLN
INTRAMUSCULAR | Status: AC
  Administered 2016-10-22: 25 mg via INTRAVENOUS
  Filled 2016-10-22: qty 1

## 2016-10-22 MED ORDER — SIMETHICONE 80 MG PO CHEW
80.0000 mg | CHEWABLE_TABLET | Freq: Three times a day (TID) | ORAL | Status: DC
  Administered 2016-10-22 – 2016-10-24 (×5): 80 mg via ORAL
  Filled 2016-10-22 (×11): qty 1

## 2016-10-22 MED ORDER — WITCH HAZEL-GLYCERIN EX PADS: 1.0000 "application " | MEDICATED_PAD | CUTANEOUS | Status: DC | PRN

## 2016-10-22 MED ORDER — SODIUM CHLORIDE 0.9 % IR SOLN
Status: DC | PRN
  Administered 2016-10-22: 1000 mL

## 2016-10-22 MED ORDER — FENTANYL CITRATE (PF) 100 MCG/2ML IJ SOLN
25.0000 ug | INTRAMUSCULAR | Status: DC | PRN
  Administered 2016-10-22: 25 ug via INTRAVENOUS
  Administered 2016-10-22: 20 ug via INTRAVENOUS
  Administered 2016-10-22: 25 ug via INTRAVENOUS

## 2016-10-22 MED ORDER — SENNOSIDES-DOCUSATE SODIUM 8.6-50 MG PO TABS
2.0000 | ORAL_TABLET | ORAL | Status: DC
  Administered 2016-10-22 – 2016-10-23 (×2): 2 via ORAL
  Filled 2016-10-22 (×4): qty 2

## 2016-10-22 MED ORDER — MEPERIDINE HCL 25 MG/ML IJ SOLN
6.2500 mg | INTRAMUSCULAR | Status: DC | PRN
Start: 2016-10-22 — End: 2016-10-22

## 2016-10-22 MED ORDER — BUPIVACAINE IN DEXTROSE 0.75-8.25 % IT SOLN
INTRATHECAL | Status: AC
  Filled 2016-10-22: qty 2

## 2016-10-22 MED ORDER — MENTHOL 3 MG MT LOZG
1.0000 | LOZENGE | OROMUCOSAL | Status: DC | PRN
  Filled 2016-10-22: qty 9

## 2016-10-22 MED ORDER — SODIUM CHLORIDE 0.9% FLUSH: 3.0000 mL | INTRAVENOUS | Status: DC | PRN

## 2016-10-22 MED ORDER — COCONUT OIL OIL
1.0000 "application " | TOPICAL_OIL | Status: DC | PRN
  Filled 2016-10-22: qty 120

## 2016-10-22 MED ORDER — NALBUPHINE HCL 10 MG/ML IJ SOLN
5.0000 mg | INTRAMUSCULAR | Status: DC | PRN
  Administered 2016-10-22: 5 mg via INTRAVENOUS

## 2016-10-22 MED ORDER — IBUPROFEN 600 MG PO TABS
600.0000 mg | ORAL_TABLET | Freq: Four times a day (QID) | ORAL | Status: DC
  Administered 2016-10-22 – 2016-10-24 (×9): 600 mg via ORAL
  Filled 2016-10-22 (×9): qty 1

## 2016-10-22 MED ORDER — LACTATED RINGERS IV SOLN
INTRAVENOUS | Status: DC | PRN
  Administered 2016-10-22: 40 [IU] via INTRAVENOUS

## 2016-10-22 MED ORDER — CEFAZOLIN SODIUM-DEXTROSE 2-4 GM/100ML-% IV SOLN
2.0000 g | INTRAVENOUS | Status: AC
  Administered 2016-10-22: 2 g via INTRAVENOUS

## 2016-10-22 MED ORDER — LACTATED RINGERS IV SOLN
INTRAVENOUS | Status: DC
  Administered 2016-10-22: 19:00:00 via INTRAVENOUS

## 2016-10-22 MED ORDER — ZOLPIDEM TARTRATE 5 MG PO TABS: 5.0000 mg | ORAL_TABLET | Freq: Every evening | ORAL | Status: DC | PRN

## 2016-10-22 MED ORDER — ONDANSETRON HCL 4 MG/2ML IJ SOLN
4.0000 mg | Freq: Once | INTRAMUSCULAR | Status: AC | PRN
  Administered 2016-10-22: 4 mg via INTRAVENOUS

## 2016-10-22 MED ORDER — LACTATED RINGERS IV SOLN
INTRAVENOUS | Status: DC | PRN
  Administered 2016-10-22: 08:00:00 via INTRAVENOUS

## 2016-10-22 MED ORDER — DIBUCAINE 1 % RE OINT
1.0000 "application " | TOPICAL_OINTMENT | RECTAL | Status: DC | PRN
  Filled 2016-10-22: qty 28

## 2016-10-22 MED ORDER — ONDANSETRON HCL 4 MG/2ML IJ SOLN: 4.0000 mg | Freq: Three times a day (TID) | INTRAMUSCULAR | Status: DC | PRN

## 2016-10-22 MED ORDER — DIPHENHYDRAMINE HCL 25 MG PO CAPS
25.0000 mg | ORAL_CAPSULE | ORAL | Status: DC | PRN
  Filled 2016-10-22: qty 1

## 2016-10-22 MED ORDER — DIPHENHYDRAMINE HCL 50 MG/ML IJ SOLN: 12.5000 mg | INTRAMUSCULAR | Status: DC | PRN

## 2016-10-22 MED ORDER — NALOXONE HCL 2 MG/2ML IJ SOSY
1.0000 ug/kg/h | PREFILLED_SYRINGE | INTRAVENOUS | Status: DC | PRN
  Filled 2016-10-22: qty 2

## 2016-10-22 MED ORDER — NALBUPHINE HCL 10 MG/ML IJ SOLN
5.0000 mg | Freq: Once | INTRAMUSCULAR | Status: AC | PRN
  Administered 2016-10-22: 5 mg via INTRAVENOUS

## 2016-10-22 MED ORDER — SCOPOLAMINE 1 MG/3DAYS TD PT72
1.0000 | MEDICATED_PATCH | Freq: Once | TRANSDERMAL | Status: DC
  Administered 2016-10-22: 1.5 mg via TRANSDERMAL

## 2016-10-22 MED ORDER — ONDANSETRON HCL 4 MG/2ML IJ SOLN
INTRAMUSCULAR | Status: AC
  Filled 2016-10-22: qty 2

## 2016-10-22 MED ORDER — NALOXONE HCL 0.4 MG/ML IJ SOLN: 0.4000 mg | INTRAMUSCULAR | Status: DC | PRN

## 2016-10-22 MED ORDER — PROMETHAZINE HCL 25 MG/ML IJ SOLN
25.0000 mg | Freq: Once | INTRAMUSCULAR | Status: AC
  Administered 2016-10-22: 25 mg via INTRAVENOUS

## 2016-10-22 MED ORDER — NALBUPHINE HCL 10 MG/ML IJ SOLN: 5.0000 mg | Freq: Once | INTRAMUSCULAR | Status: AC | PRN

## 2016-10-22 MED ORDER — OXYTOCIN 10 UNIT/ML IJ SOLN
INTRAMUSCULAR | Status: AC
  Filled 2016-10-22: qty 4

## 2016-10-22 MED ORDER — ACETAMINOPHEN 325 MG PO TABS
650.0000 mg | ORAL_TABLET | ORAL | Status: DC | PRN
  Administered 2016-10-23: 650 mg via ORAL
  Filled 2016-10-22: qty 2

## 2016-10-22 MED ORDER — FENTANYL CITRATE (PF) 100 MCG/2ML IJ SOLN
25.0000 ug | INTRAMUSCULAR | Status: DC | PRN
  Administered 2016-10-22: 50 ug via INTRAVENOUS

## 2016-10-22 MED ORDER — PRENATAL MULTIVITAMIN CH
1.0000 | ORAL_TABLET | Freq: Every day | ORAL | Status: DC
  Administered 2016-10-22 – 2016-10-24 (×3): 1 via ORAL
  Filled 2016-10-22 (×5): qty 1

## 2016-10-22 MED ORDER — PHENYLEPHRINE 8 MG IN D5W 100 ML (0.08MG/ML) PREMIX OPTIME
INJECTION | INTRAVENOUS | Status: DC | PRN
  Administered 2016-10-22: 40 ug/min via INTRAVENOUS

## 2016-10-22 MED ORDER — OXYCODONE HCL 5 MG PO TABS
10.0000 mg | ORAL_TABLET | ORAL | Status: DC | PRN
  Administered 2016-10-23 (×2): 10 mg via ORAL
  Filled 2016-10-22 (×3): qty 2

## 2016-10-22 MED ORDER — DIPHENHYDRAMINE HCL 25 MG PO CAPS
25.0000 mg | ORAL_CAPSULE | Freq: Four times a day (QID) | ORAL | Status: DC | PRN
  Administered 2016-10-22: 25 mg via ORAL
  Filled 2016-10-22 (×2): qty 1

## 2016-10-22 MED ORDER — LACTATED RINGERS IV SOLN
INTRAVENOUS | Status: DC
  Administered 2016-10-22: 07:00:00 via INTRAVENOUS

## 2016-10-22 MED ORDER — NALBUPHINE HCL 10 MG/ML IJ SOLN
INTRAMUSCULAR | Status: AC
  Filled 2016-10-22: qty 1

## 2016-10-22 MED ORDER — OXYTOCIN 40 UNITS IN LACTATED RINGERS INFUSION - SIMPLE MED: 2.5000 [IU]/h | INTRAVENOUS | Status: AC

## 2016-10-22 MED ORDER — PHENYLEPHRINE HCL 10 MG/ML IJ SOLN
INTRAMUSCULAR | Status: DC | PRN
  Administered 2016-10-22: 120 ug via INTRAVENOUS
  Administered 2016-10-22: 80 ug via INTRAVENOUS

## 2016-10-22 MED ORDER — SIMETHICONE 80 MG PO CHEW
80.0000 mg | CHEWABLE_TABLET | ORAL | Status: DC | PRN
  Filled 2016-10-22: qty 1

## 2016-10-22 MED ORDER — ONDANSETRON HCL 4 MG/2ML IJ SOLN: 4.0000 mg | Freq: Once | INTRAMUSCULAR | Status: DC | PRN

## 2016-10-22 MED ORDER — SCOPOLAMINE 1 MG/3DAYS TD PT72
MEDICATED_PATCH | TRANSDERMAL | Status: AC
  Filled 2016-10-22: qty 1

## 2016-10-22 MED ORDER — MORPHINE SULFATE (PF) 0.5 MG/ML IJ SOLN
INTRAMUSCULAR | Status: AC
  Filled 2016-10-22: qty 10

## 2016-10-22 MED ORDER — CEFAZOLIN SODIUM-DEXTROSE 2-4 GM/100ML-% IV SOLN
INTRAVENOUS | Status: AC
  Filled 2016-10-22: qty 100

## 2016-10-22 SURGICAL SUPPLY — 39 items
BARRIER ADHS 3X4 INTERCEED (GAUZE/BANDAGES/DRESSINGS) ×4 IMPLANT
BENZOIN TINCTURE PRP APPL 2/3 (GAUZE/BANDAGES/DRESSINGS) ×4 IMPLANT
CHLORAPREP W/TINT 26ML (MISCELLANEOUS) ×4 IMPLANT
CLAMP CORD UMBIL (MISCELLANEOUS) IMPLANT
CLOSURE WOUND 1/2 X4 (GAUZE/BANDAGES/DRESSINGS) ×1
CLOTH BEACON ORANGE TIMEOUT ST (SAFETY) ×4 IMPLANT
DERMABOND ADVANCED (GAUZE/BANDAGES/DRESSINGS)
DERMABOND ADVANCED .7 DNX12 (GAUZE/BANDAGES/DRESSINGS) IMPLANT
DRSG OPSITE POSTOP 4X10 (GAUZE/BANDAGES/DRESSINGS) ×4 IMPLANT
ELECT REM PT RETURN 9FT ADLT (ELECTROSURGICAL) ×4
ELECTRODE REM PT RTRN 9FT ADLT (ELECTROSURGICAL) ×2 IMPLANT
EXTRACTOR VACUUM KIWI (MISCELLANEOUS) IMPLANT
GLOVE BIOGEL PI IND STRL 6.5 (GLOVE) ×2 IMPLANT
GLOVE BIOGEL PI IND STRL 7.0 (GLOVE) ×4 IMPLANT
GLOVE BIOGEL PI INDICATOR 6.5 (GLOVE) ×2
GLOVE BIOGEL PI INDICATOR 7.0 (GLOVE) ×4
GLOVE ECLIPSE 6.5 STRL STRAW (GLOVE) ×4 IMPLANT
GOWN STRL REUS W/TWL LRG LVL3 (GOWN DISPOSABLE) ×12 IMPLANT
HOVERMATT SINGLE USE (MISCELLANEOUS) ×4 IMPLANT
KIT ABG SYR 3ML LUER SLIP (SYRINGE) IMPLANT
NEEDLE HYPO 25X5/8 SAFETYGLIDE (NEEDLE) IMPLANT
NS IRRIG 1000ML POUR BTL (IV SOLUTION) ×4 IMPLANT
PACK C SECTION WH (CUSTOM PROCEDURE TRAY) ×4 IMPLANT
PAD ABD 7.5X8 STRL (GAUZE/BANDAGES/DRESSINGS) ×4 IMPLANT
PAD OB MATERNITY 4.3X12.25 (PERSONAL CARE ITEMS) ×4 IMPLANT
PENCIL SMOKE EVAC W/HOLSTER (ELECTROSURGICAL) ×4 IMPLANT
RTRCTR C-SECT PINK 25CM LRG (MISCELLANEOUS) ×4 IMPLANT
STRIP CLOSURE SKIN 1/2X4 (GAUZE/BANDAGES/DRESSINGS) ×3 IMPLANT
SUT PLAIN 0 NONE (SUTURE) IMPLANT
SUT PLAIN 2 0 XLH (SUTURE) IMPLANT
SUT VIC AB 0 CT1 27 (SUTURE) ×4
SUT VIC AB 0 CT1 27XBRD ANBCTR (SUTURE) ×4 IMPLANT
SUT VIC AB 0 CTX 36 (SUTURE) ×6
SUT VIC AB 0 CTX36XBRD ANBCTRL (SUTURE) ×6 IMPLANT
SUT VIC AB 2-0 CT1 27 (SUTURE) ×2
SUT VIC AB 2-0 CT1 TAPERPNT 27 (SUTURE) ×2 IMPLANT
SUT VIC AB 4-0 KS 27 (SUTURE) ×4 IMPLANT
TOWEL OR 17X24 6PK STRL BLUE (TOWEL DISPOSABLE) ×4 IMPLANT
TRAY FOLEY BAG SILVER LF 14FR (SET/KITS/TRAYS/PACK) IMPLANT

## 2016-10-22 NOTE — Interval H&P Note (Signed)
History and Physical Interval Note:  10/22/2016 6:31 AM  Bonney Leitz  has presented today for surgery, with the diagnosis of * No pre-op diagnosis entered *  The various methods of treatment have been discussed with the patient and family. After consideration of risks, benefits and other options for treatment, the patient has consented to  Procedure(s): CESAREAN SECTION (N/A) and tubal ligation as a surgical intervention .  The patient's history has been reviewed, patient examined, no change in status, stable for surgery.  I have reviewed the patient's chart and labs.  Questions were answered to the patient's satisfaction.     Janyth Pupa, M

## 2016-10-22 NOTE — Anesthesia Postprocedure Evaluation (Signed)
Anesthesia Post Note  Patient: Sharnita Bogucki Teas  Procedure(s) Performed: Procedure(s) (LRB): CESAREAN SECTION (N/A) POST PARTUM TUBAL LIGATION (Bilateral)     Patient location during evaluation: PACU Anesthesia Type: Spinal Level of consciousness: oriented and awake and alert Pain management: pain level controlled Vital Signs Assessment: post-procedure vital signs reviewed and stable Respiratory status: spontaneous breathing, respiratory function stable and patient connected to nasal cannula oxygen Cardiovascular status: blood pressure returned to baseline and stable Postop Assessment: no headache and no backache Anesthetic complications: no    Last Vitals:  Vitals:   10/22/16 0900 10/22/16 0915  BP: 110/67 114/69  Pulse: 89 82  Resp: 14 17  Temp: 36.5 C   SpO2: 100% 100%    Last Pain:  Vitals:   10/22/16 0900  TempSrc: Oral  PainSc: 0-No pain   Pain Goal:                 Yusef Lamp

## 2016-10-22 NOTE — H&P (View-Only) (Signed)
HPI: 31 y/o G3P2002 @ [redacted]w[redacted]d estimated gestational age (as dated by LMP c/w 20 week ultrasound) presents for scheduled repeat C-section and tubal ligation.   no Leaking of Fluid,   no Vaginal Bleeding,   no Uterine Contractions,  + Fetal Movement.  Prenatal care has been provided by [facility]- Dr. Nelda Marseille  ROS: no HA, no epigastric pain, no visual changes.    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   Prenatal Transfer Tool  Maternal Diabetes: No Genetic Screening: Normal Maternal Ultrasounds/Referrals: Normal Fetal Ultrasounds or other Referrals:  None Maternal Substance Abuse:  No Significant Maternal Medications:  Meds include: Other: zantac, valtrex Significant Maternal Lab Results: Lab values include: Group B Strep negative   PNL:  GBS neg, Rub Immune, Hep B neg, RPR NR, HIV neg, GC/C neg, glucola:120 Hgb: 11.7 Blood type: B positive  Immunizations: Tdap: declined OBHx: FT C-section x 2- 7#, both males PMHx:  Anxiety, GERD Meds:  PNV, zantac valtrex Allergy:   Allergies  Allergen Reactions  . Reglan [Metoclopramide] Shortness Of Breath   SurgHx: C-section x 2 SocHx:   no Tobacco, no  EtOH, no Illicit Drugs  O: LMP 07/62/2633  Gen. AAOx3, NAD CV.  RRR  No murmur.  Resp. CTAB, no wheeze or crackles. Abd. Gravid,  no tenderness,  no rigidity,  no guarding Extr.  1+ edema B/L , no calf tenderness, neg Homan's B/L  FHT: 130 bpm SVE: closed/long/high   Labs: see orders  A/P:  31 y.o. [id number] @ [redacted]w[redacted]d EGA who presents for repeat C-section and tubal ligation -FWB:  Reassuring by doppler -NPO -LR @ 125cc/hr -SCDs to OR -Ancef 2g IV -Risk benefits and alternatives of cesarean section with tubal ligation were discussed with the patient including but not limited to infection, bleeding, damage to bowel , bladder and baby with the need for further surgery. Pt voiced understanding and  desires to proceed.   Janyth Pupa, DO [phone] (pager) [phone] (office)

## 2016-10-22 NOTE — Transfer of Care (Signed)
Immediate Anesthesia Transfer of Care Note  Patient: Lori Callahan  Procedure(s) Performed: Procedure(s): CESAREAN SECTION (N/A) POST PARTUM TUBAL LIGATION (Bilateral)  Patient Location: PACU  Anesthesia Type:Spinal  Level of Consciousness: awake, alert  and oriented  Airway & Oxygen Therapy: Patient Spontanous Breathing  Post-op Assessment: Report given to RN and Post -op Vital signs reviewed and stable  Post vital signs: Reviewed and stable  Last Vitals:  Vitals:   10/22/16 0626  BP: 138/76  Pulse: 96  Resp: 18  Temp: 37 C  SpO2: 100%    Last Pain:  Vitals:   10/22/16 0626  TempSrc: Oral  PainSc:          Complications: No apparent anesthesia complications

## 2016-10-22 NOTE — Op Note (Signed)
PreOp Diagnosis: 1) Intrauterine pregnancy @ [redacted]w[redacted]d 2) Prior C-section x 2  3) Desire for permanent sterilization PostOp Diagnosis: same Procedure: Repeat C-section and bilateral tubal ligation Surgeon: Dr. Janyth Pupa Assistant: Dr. Christophe Louis Anesthesia: spinal Complications: none EBL: 810cc UOP: 250cc Fluids: 2200cc  Findings: Female infant from vertex presentation, normal uterus, tubes and ovaries bilaterally.  PROCEDURE:  Informed consent was obtained from the patient with risks, benefits, complications, treatment options, and expected outcomes discussed with the patient.  The patient concurred with the proposed plan, giving informed consent with form signed.   The patient was taken to Operating Room, and identified with the procedure verified as C-Section Delivery and bilateral tubal ligation with Time Out. With induction of anesthesia, the patient was prepped and draped in the usual sterile fashion. A Pfannenstiel incision was made and carried down through the subcutaneous tissue to the fascia. The fascia was incised in the midline and extended transversely. The superior aspect of the fascial incision was grasped with Kochers elevated and the underlying muscle dissected off. The inferior aspect of the facial incision was in similar fashion, grasped elevated and rectus muscles dissected off. The peritoneum was identified and entered. Peritoneal incision was extended longitudinally. The Alexis retractor was placed.  The utero-vesical peritoneal reflection was identified and incised transversely with the [facility] scissors, the incision extended laterally, the bladder flap created digitally. A low transverse uterine incision was made and the infants head delivered atraumatically. After the umbilical cord was clamped and cut cord blood was obtained for evaluation.   The placenta was removed intact and appeared normal. The uterine outline, tubes and ovaries appeared normal. The uterine incision was  closed with running locked sutures of 0 Vicryl and a second layer of the same stitch was used in an imbricating fashion.  Excellent hemostasis was obtained.  Attention was turned to the left side, where the left fallopian tube was  grasped with Babcock forceps and exteriorized until the fimbria was visualized.  The bovie was used to make an incision in the mesosalpinx.  Using individual ties of 0 plain catgut suture a portion of the tube was tied, ligated and removed.  Hemostasis was achieved with the bovie. An identical procedure was carried out on the opposite side. Again hemostasis was adequate.   The pericolic gutters were then cleared of all clots and debris. Interceed was placed over the hysterotomy.  The Alexis retractor was removed.  The fascia was then reapproximated with running sutures of 0 Vicryl. The subcutaneous tissue was reapproximated with 2-0 plain gut suture.  The skin was closed with 4-0 vicryl in a subcuticular fashion.  Instrument, sponge, and needle counts were correct prior the abdominal closure and at the conclusion of the case. The patient was taken to recovery in stable condition.  Dr. Landry Mellow was present to assist due to the potential complexity of the case as patient has had abdominal surgery x 2.  Janyth Pupa, DO [phone] (pager) [phone] (office)

## 2016-10-22 NOTE — Anesthesia Procedure Notes (Signed)
Spinal  Patient location during procedure: OR Start time: 10/22/2016 7:15 AM End time: 10/22/2016 7:33 AM Staffing Anesthesiologist: Vola Beneke Preanesthetic Checklist Completed: patient identified, site marked, surgical consent, pre-op evaluation, timeout performed, IV checked, risks and benefits discussed and monitors and equipment checked Spinal Block Patient position: sitting Prep: DuraPrep Patient monitoring: heart rate, cardiac monitor, continuous pulse ox and blood pressure Approach: midline Location: L3-4 Injection technique: single-shot Needle Needle type: Sprotte  Needle gauge: 24 G Needle length: 9 cm Assessment Sensory level: T4

## 2016-10-22 NOTE — Plan of Care (Signed)
Problem: Role Relationship: Goal: Ability to demonstrate positive interaction with newborn will improve Outcome: Completed/Met Date Met: 10/22/16 Mother and baby bonding well

## 2016-10-22 NOTE — Anesthesia Preprocedure Evaluation (Signed)
Anesthesia Evaluation  Patient identified by MRN, date of birth, ID band Patient awake    Reviewed: Allergy & Precautions, H&P , Patient's Chart, lab work & pertinent test results  Airway Mallampati: II  TM Distance: >3 FB Neck ROM: full    Dental no notable dental hx.    Pulmonary former smoker,    Pulmonary exam normal breath sounds clear to auscultation       Cardiovascular Exercise Tolerance: Good  Rhythm:regular Rate:Normal     Neuro/Psych    GI/Hepatic GERD  ,  Endo/Other  Morbid obesity  Renal/GU      Musculoskeletal   Abdominal   Peds  Hematology   Anesthesia Other Findings   Reproductive/Obstetrics                             Anesthesia Physical  Anesthesia Plan  ASA: III  Anesthesia Plan: Spinal   Post-op Pain Management:    Induction:   PONV Risk Score and Plan: 2 and Ondansetron, Dexamethasone, Treatment may vary due to age or medical condition, Midazolam and Scopolamine patch - Pre-op  Airway Management Planned:   Additional Equipment:   Intra-op Plan:   Post-operative Plan:   Informed Consent: I have reviewed the patients History and Physical, chart, labs and discussed the procedure including the risks, benefits and alternatives for the proposed anesthesia with the patient or authorized representative who has indicated his/her understanding and acceptance.   Dental Advisory Given  Plan Discussed with: CRNA  Anesthesia Plan Comments: (Platelets okay. Discussed spinal anesthetic, and patient consents to the procedure:  included risk of possible headache,backache, failed block, allergic reaction, and nerve injury. This patient was asked if she had any questions or concerns before the procedure started. )        Anesthesia Quick Evaluation

## 2016-10-23 LAB — CBC
HCT: 29.9 % — ABNORMAL LOW (ref 36.0–46.0)
HEMOGLOBIN: 10.2 g/dL — AB (ref 12.0–15.0)
MCH: 31.2 pg (ref 26.0–34.0)
MCHC: 34.1 g/dL (ref 30.0–36.0)
MCV: 91.4 fL (ref 78.0–100.0)
Platelets: 180 10*3/uL (ref 150–400)
RBC: 3.27 MIL/uL — AB (ref 3.87–5.11)
RDW: 14.3 % (ref 11.5–15.5)
WBC: 13.6 10*3/uL — ABNORMAL HIGH (ref 4.0–10.5)

## 2016-10-23 LAB — BIRTH TISSUE RECOVERY COLLECTION (PLACENTA DONATION)

## 2016-10-23 MED ORDER — HYDROMORPHONE HCL 2 MG PO TABS
2.0000 mg | ORAL_TABLET | Freq: Once | ORAL | Status: AC
  Administered 2016-10-23: 2 mg via ORAL
  Filled 2016-10-23: qty 1

## 2016-10-23 MED ORDER — HYDROMORPHONE HCL 2 MG PO TABS
2.0000 mg | ORAL_TABLET | Freq: Four times a day (QID) | ORAL | Status: DC | PRN
  Administered 2016-10-23 – 2016-10-24 (×4): 2 mg via ORAL
  Filled 2016-10-23 (×4): qty 1

## 2016-10-23 NOTE — Plan of Care (Signed)
Problem: Activity: Goal: Will verbalize the importance of balancing activity with adequate rest periods Outcome: Completed/Met Date Met: 10/23/16 Encouraged ambulation in hallway today to assist with pain and gas.  Problem: Pain Management: Goal: General experience of comfort will improve and pain level will decrease Discussed with patient the importance of keeping up with pain medication today before pain increases significantly and encouraged taking tylenol along with the oxycodone to help with pain control. Discussed the importance of ambulating in hallway today to get gas out since patient is not passing gas yet. Also discussed increased movement to assist with pain.

## 2016-10-23 NOTE — Progress Notes (Signed)
MOB was referred for history of depression/anxiety. * Referral screened out by Clinical Social Worker because none of the following criteria appear to apply: ~ History of anxiety/depression during this pregnancy, or of post-partum depression. ~ Diagnosis of anxiety and/or depression within last 3 years OR * MOB's symptoms currently being treated with medication and/or therapy. Please contact the Clinical Social Worker if needs arise, by [facility] request, or if MOB scores 9 or greater/yes to question 10 on [place] Postpartum Depression Screen.

## 2016-10-23 NOTE — Plan of Care (Signed)
Problem: Pain Management: Goal: General experience of comfort will improve and pain level will decrease Patient states one time dose of dilaudid this morning worked better for her for pain than the oxycodone and tylenol together. Called Dr. Nelda Marseille who ordered 2mg  dilaudid Q6 hours PRN.

## 2016-10-23 NOTE — Progress Notes (Signed)
Postpartum Note Day # 1  S:  Patient resting comfortable in bed.  Pt reports some difficulty with pain overnight; however, per RN when she went to go wake her for pain medicine she was comfortably sleeping. No flatus, no BM.  Lochia moderate.  Ambulating without difficulty.  She denies n/v/f/c, SOB, or CP.  Pt plans on breastfeeding.  O: Temp:  [97.4 F (36.3 C)-99.8 F (37.7 C)] 98 F (36.7 C) (08/15 0435) Pulse Rate:  [57-89] 78 (08/15 0435) Resp:  [10-20] 20 (08/15 0435) BP: (92-122)/(50-82) 122/72 (08/15 0435) SpO2:  [97 %-100 %] 97 % (08/14 1320) Gen: A&Ox3, NAD CV: RRR, no MRG Resp: CTAB Abdomen: soft, NT, ND BS quiet Uterus: firm, non-tender, below umbilicus Incision: c/d/i, bandage on Ext: 1+ non-pitting edema, no calf tenderness bilaterally, SCDs in place  Labs:  Recent Labs  10/21/16 1155 10/23/16 0554  HGB 11.3* 10.2*    A/P: Pt is a 31 y.o. [id number] s/p repeat C-section and bilateral tubal ligation, POD#1  - Pt given po dilaudid x1 this am, plan on work on pain management today -GU: UOP is adequate- voiding freely -GI: Tolerating general diet -Activity: encouraged sitting up to chair and ambulation as tolerated -Prophylaxis: early ambulation, SCDs while in bed -Labs: stable as above  Continue routine postoperative care  Janyth Pupa, DO [phone] (pager) [phone] (office)

## 2016-10-23 NOTE — Plan of Care (Signed)
Problem: Pain Management: Goal: General experience of comfort will improve and pain level will decrease Patient has not ambulated in hallway at all today after encouraging her multiple times and discussing the importance of ambulating for pain control. Upon giving her 1800 ibuprofen, patient stated she would walk in halls once her husband got back. Noted patient ambulating in hallway at 1900.

## 2016-10-24 MED ORDER — POLYETHYLENE GLYCOL 3350 17 G PO PACK
17.0000 g | PACK | Freq: Every day | ORAL | Status: DC | PRN
  Filled 2016-10-24: qty 1

## 2016-10-24 MED ORDER — HYDROMORPHONE HCL 2 MG PO TABS: 2.0000 mg | ORAL_TABLET | Freq: Four times a day (QID) | ORAL | 0 refills | Status: DC | PRN

## 2016-10-24 MED ORDER — DOCUSATE SODIUM 100 MG PO CAPS: 100.0000 mg | ORAL_CAPSULE | Freq: Two times a day (BID) | ORAL | 0 refills | Status: DC

## 2016-10-24 MED ORDER — BISACODYL 5 MG PO TBEC
5.0000 mg | DELAYED_RELEASE_TABLET | Freq: Every day | ORAL | Status: DC | PRN
  Filled 2016-10-24: qty 1

## 2016-10-24 MED ORDER — IBUPROFEN 600 MG PO TABS: 600.0000 mg | ORAL_TABLET | Freq: Four times a day (QID) | ORAL | 0 refills | Status: DC

## 2016-10-24 NOTE — Discharge Summary (Signed)
OB Discharge Summary     Patient Name: Lori Callahan DOB: Jun 04, 1985 MRN: [id number]  Date of admission: 10/22/2016 Delivering MD: Janyth Pupa   Date of discharge: 10/24/2016  Admitting diagnosis: HISTORY OF C-SECTION Intrauterine pregnancy: [redacted]w[redacted]d     Secondary diagnosis:  Active Problems:   Normal intrauterine pregnancy in third trimester  Additional problems: Obesity     Discharge diagnosis: Term Pregnancy Delivered                                                                                                Post partum procedures:none  Augmentation: N/A  Complications: None  Hospital course:  Sceduled C/S   31 y.o. yo G3P3003 at [redacted]w[redacted]d was admitted to the hospital 10/22/2016 for scheduled cesarean section with the following indication:Elective Repeat.  Membrane Rupture Time/Date: 7:55 AM ,10/22/2016   Patient delivered a Viable infant.10/22/2016  Details of operation can be found in separate operative note.  Pateint had an uncomplicated postpartum course.  She is ambulating, tolerating a regular diet, passing flatus, and urinating well. Patient is discharged home in stable condition on  10/24/16         Physical exam  Vitals:   10/23/16 0435 10/23/16 0815 10/23/16 1840 10/24/16 0500  BP: 122/72 95/71 114/70 119/70  Pulse: 78 80 81 76  Resp: 20 18 20    Temp: 98 F (36.7 C) 98.4 F (36.9 C) 98.2 F (36.8 C) 98 F (36.7 C)  TempSrc:  Oral Oral Oral  SpO2:  100%    Weight:      Height:       General: alert, cooperative and no distress Lochia: appropriate Uterine Fundus: firm Incision: Dressing is clean, dry, and intact DVT Evaluation: No evidence of DVT seen on physical exam. Labs: Lab Results  Component Value Date   WBC 13.6 (H) 10/23/2016   HGB 10.2 (L) 10/23/2016   HCT 29.9 (L) 10/23/2016   MCV 91.4 10/23/2016   PLT 180 10/23/2016   CMP Latest Ref Rng & Units 09/20/2011  Glucose 70 - 99 mg/dL 82  BUN 6 - 23 mg/dL 4(L)  Creatinine 0.50 - 1.10 mg/dL  0.50  Sodium 135 - 145 mEq/L 138  Potassium 3.5 - 5.1 mEq/L 3.3(L)  Chloride 96 - 112 mEq/L 103  CO2 19 - 32 mEq/L 24  Calcium 8.4 - 10.5 mg/dL 8.9  Total Protein 6.0 - 8.3 g/dL -  Total Bilirubin 0.3 - 1.2 mg/dL -  Alkaline Phos 39 - 117 U/L -  AST 0 - 37 U/L -  ALT 0 - 35 U/L -    Discharge instruction: per After Visit Summary and "Baby and Me Booklet".  After visit meds:  Allergies as of 10/24/2016      Reactions   Reglan [metoclopramide] Shortness Of Breath      Medication List    TAKE these medications   acetaminophen 500 MG tablet Commonly known as:  TYLENOL Take 1,000 mg by mouth daily as needed for moderate pain or headache.   calcium carbonate 500 MG chewable tablet Commonly known as:  TUMS - dosed in  mg elemental calcium Chew 2 tablets by mouth 2 (two) times daily as needed for indigestion or heartburn.   docusate sodium 100 MG capsule Commonly known as:  COLACE Take 1 capsule (100 mg total) by mouth 2 (two) times daily.   HYDROmorphone 2 MG tablet Commonly known as:  DILAUDID Take 1 tablet (2 mg total) by mouth every 6 (six) hours as needed for severe pain.   ibuprofen 600 MG tablet Commonly known as:  ADVIL,MOTRIN Take 1 tablet (600 mg total) by mouth every 6 (six) hours.   PRENATAL VITAMINS PO Take 2 tablets by mouth daily.   ranitidine 150 MG tablet Commonly known as:  ZANTAC Take 150 mg by mouth 2 (two) times daily as needed for heartburn.   valACYclovir 500 MG tablet Commonly known as:  VALTREX Take 500 mg by mouth 3 (three) times a week.       Diet: routine diet  Activity: Advance as tolerated. Pelvic rest for 6 weeks.   Outpatient follow up:2 weeks Follow up Appt:No future appointments. Follow up Visit:No Follow-up on file.  Postpartum contraception: Tubal Ligation  Newborn Data: Live born female  Birth Weight: 7 lb 10.9 oz (3485 g) APGAR: 10, 10  Baby Feeding: Breast Disposition:home with mother   10/24/2016 Janyth Pupa, M, DO

## 2016-10-24 NOTE — Discharge Instructions (Signed)
Cesarean Delivery, Care After Refer to this sheet in the next few weeks. These instructions provide you with information about caring for yourself after your procedure. Your health care provider may also give you more specific instructions. Your treatment has been planned according to current medical practices, but problems sometimes occur. Call your health care provider if you have any problems or questions after your procedure. What can I expect after the procedure? After the procedure, it is common to have:  A small amount of blood or clear fluid coming from the incision.  Some redness, swelling, and pain in your incision area.  Some abdominal pain and soreness.  Vaginal bleeding (lochia).  Pelvic cramps.  Fatigue.  Follow these instructions at home: Incision care   Follow instructions from your health care provider about how to take care of your incision. Make sure you: ? Wash your hands with soap and water before you change your bandage (dressing). If soap and water are not available, use hand sanitizer. ? Change your dressing as told by your health care provider. ? Leave stitches (sutures), skin staples, skin glue, or adhesive strips in place. These skin closures may need to stay in place for 2 weeks or longer. If adhesive strip edges start to loosen and curl up, you may trim the loose edges. Do not remove adhesive strips completely unless your health care provider tells you to do that.  Check your incision area every day for signs of infection. Check for: ? More redness, swelling, or pain. ? More fluid or blood. ? Warmth. ? Pus or a bad smell.  When you cough or sneeze, hug a pillow. This helps with pain and decreases the chance of your incision opening up (dehiscing). Do this until your incision heals. Medicines  Take over-the-counter and prescription medicines only as told by your health care provider.  For pain management you may alternate between Dilaudid and Ibuprofen.   Dilaudid may cause constipation, be sure to take colace twice daily as a stool softener.  If you were prescribed an antibiotic medicine, take it as told by your health care provider. Do not stop taking the antibiotic until it is finished. Driving  Do not drive or operate heavy machinery while taking prescription pain medicine.  Do not drive for 24 hours if you received a sedative. Lifestyle  Do not drink alcohol. This is especially important if you are breastfeeding or taking pain medicine.  Do not use tobacco products, including cigarettes, chewing tobacco, or e-cigarettes. If you need help quitting, ask your health care provider. Tobacco can delay wound healing. Eating and drinking  Drink at least 8 eight-ounce glasses of water every day unless told not to by your health care provider. If you breastfeed, you may need to drink more water than this.  Eat high-fiber foods every day. These foods may help prevent or relieve constipation. High-fiber foods include: ? Whole grain cereals and breads. ? Brown rice. ? Beans. ? Fresh fruits and vegetables. Activity  Return to your normal activities as told by your health care provider. Ask your health care provider what activities are safe for you.  Rest as much as possible. Try to rest or take a nap while your baby is sleeping.  Do not lift anything that is heavier than your baby or 10 lb (4.5 kg) as told by your health care provider.  Ask your health care provider when you can engage in sexual activity. This may depend on your: ? Risk of infection. ?  Healing rate. ? Comfort and desire to engage in sexual activity. Bathing  Do not take baths, swim, or use a hot tub until your health care provider approves. Ask your health care provider if you can take showers. You may only be allowed to take sponge baths until your incision heals.  Keep your dressing dry as told by your health care provider. General instructions  Do not use tampons or  douches until your health care provider approves.  Wear: ? Loose, comfortable clothing. ? A supportive and well-fitting bra.  Watch for any blood clots that may pass from your vagina. These may look like clumps of dark red, brown, or black discharge.  Keep your perineum clean and dry as told by your health care provider.  Wipe from front to back when you use the toilet.  If possible, have someone help you care for your baby and help with household activities for a few days after you leave the hospital.  Keep all follow-up visits for you and your baby as told by your health care provider. This is important. Contact a health care provider if:  You have: ? Bad-smelling vaginal discharge. ? Difficulty urinating. ? Pain when urinating. ? A sudden increase or decrease in the frequency of your bowel movements. ? More redness, swelling, or pain around your incision. ? More fluid or blood coming from your incision. ? Pus or a bad smell coming from your incision. ? A fever. ? A rash. ? Little or no interest in activities you used to enjoy. ? Questions about caring for yourself or your baby. ? Nausea.  Your incision feels warm to the touch.  Your breasts turn red or become painful or hard.  You feel unusually sad or worried.  You vomit.  You pass large blood clots from your vagina. If you pass a blood clot, save it to show to your health care provider. Do not flush blood clots down the toilet without showing your health care provider.  You urinate more than usual.  You are dizzy or light-headed.  You have not breastfed and have not had a menstrual period for 12 weeks after delivery.  You stopped breastfeeding and have not had a menstrual period for 12 weeks after stopping breastfeeding. Get help right away if:  You have: ? Pain that does not go away or get better with medicine. ? Chest pain. ? Difficulty breathing. ? Blurred vision or spots in your vision. ? Thoughts about  hurting yourself or your baby. ? New pain in your abdomen or in one of your legs. ? A severe headache.  You faint.  You bleed from your vagina so much that you fill two sanitary pads in one hour. This information is not intended to replace advice given to you by your health care provider. Make sure you discuss any questions you have with your health care provider. Document Released: 11/17/2001 Document Revised: 07/06/2015 Document Reviewed: 01/30/2015 Elsevier Interactive Patient Education  2017 [employer].

## 2017-01-04 ENCOUNTER — Encounter (HOSPITAL_COMMUNITY): Payer: Self-pay | Admitting: Emergency Medicine

## 2017-01-04 ENCOUNTER — Emergency Department (HOSPITAL_COMMUNITY)
Admission: EM | Admit: 2017-01-04 | Discharge: 2017-01-04 | Disposition: A | Discharge status: Home / Self Care | Payer: Medicaid Other | Attending: Emergency Medicine | Admitting: Emergency Medicine

## 2017-01-04 DIAGNOSIS — Z87891 Personal history of nicotine dependence: Secondary | ICD-10-CM | POA: Insufficient documentation

## 2017-01-04 DIAGNOSIS — N764 Abscess of vulva: Secondary | ICD-10-CM | POA: Insufficient documentation

## 2017-01-04 LAB — COMPREHENSIVE METABOLIC PANEL
ALBUMIN: 3.7 g/dL (ref 3.5–5.0)
ALT: 23 U/L (ref 14–54)
AST: 21 U/L (ref 15–41)
Alkaline Phosphatase: 64 U/L (ref 38–126)
Anion gap: 7 (ref 5–15)
BILIRUBIN TOTAL: 0.5 mg/dL (ref 0.3–1.2)
BUN: 5 mg/dL — AB (ref 6–20)
CO2: 25 mmol/L (ref 22–32)
Calcium: 8.9 mg/dL (ref 8.9–10.3)
Chloride: 106 mmol/L (ref 101–111)
Creatinine, Ser: 0.68 mg/dL (ref 0.44–1.00)
GFR calc Af Amer: >60 mL/min (ref >60)
GFR calc non Af Amer: >60 mL/min (ref >60)
GLUCOSE: 93 mg/dL (ref 65–99)
POTASSIUM: 3.8 mmol/L (ref 3.5–5.1)
Sodium: 138 mmol/L (ref 135–145)
TOTAL PROTEIN: 7 g/dL (ref 6.5–8.1)

## 2017-01-04 LAB — CBC WITH DIFFERENTIAL/PLATELET
BASOS ABS: 0 10*3/uL (ref 0.0–0.1)
Basophils Relative: 0 %
EOS PCT: 1 %
Eosinophils Absolute: 0.1 10*3/uL (ref 0.0–0.7)
HCT: 39.3 % (ref 36.0–46.0)
Hemoglobin: 12.8 g/dL (ref 12.0–15.0)
LYMPHS ABS: 3.2 10*3/uL (ref 0.7–4.0)
LYMPHS PCT: 35 %
MCH: 29.3 pg (ref 26.0–34.0)
MCHC: 32.6 g/dL (ref 30.0–36.0)
MCV: 89.9 fL (ref 78.0–100.0)
MONO ABS: 0.4 10*3/uL (ref 0.1–1.0)
MONOS PCT: 5 %
Neutro Abs: 5.5 10*3/uL (ref 1.7–7.7)
Neutrophils Relative %: 59 %
Platelets: 207 10*3/uL (ref 150–400)
RBC: 4.37 MIL/uL (ref 3.87–5.11)
RDW: 14 % (ref 11.5–15.5)
WBC: 9.3 10*3/uL (ref 4.0–10.5)

## 2017-01-04 MED ORDER — DOXYCYCLINE HYCLATE 100 MG PO CAPS: 100.0000 mg | ORAL_CAPSULE | Freq: Two times a day (BID) | ORAL | 0 refills | Status: DC

## 2017-01-04 MED ORDER — LIDOCAINE HCL (PF) 1 % IJ SOLN
30.0000 mL | Freq: Once | INTRAMUSCULAR | Status: AC
  Administered 2017-01-04: 30 mL
  Filled 2017-01-04: qty 30

## 2017-01-04 MED ORDER — DEXTROSE 5 % IV SOLN
1.0000 g | Freq: Once | INTRAVENOUS | Status: AC
  Administered 2017-01-04: 1 g via INTRAVENOUS
  Filled 2017-01-04: qty 10

## 2017-01-04 MED ORDER — CEPHALEXIN 500 MG PO CAPS: 500.0000 mg | ORAL_CAPSULE | Freq: Four times a day (QID) | ORAL | 0 refills | Status: DC

## 2017-01-04 MED ORDER — SODIUM CHLORIDE 0.9 % IV BOLUS (SEPSIS)
1000.0000 mL | Freq: Once | INTRAVENOUS | Status: AC
  Administered 2017-01-04: 1000 mL via INTRAVENOUS

## 2017-01-04 MED ORDER — FENTANYL CITRATE (PF) 100 MCG/2ML IJ SOLN
50.0000 ug | Freq: Once | INTRAMUSCULAR | Status: AC
  Administered 2017-01-04: 50 ug via INTRAVENOUS
  Filled 2017-01-04: qty 2

## 2017-01-04 NOTE — Discharge Instructions (Signed)
Please start taking antibiotics as prescribed.  Have discussed wound care.  Important follow-up with OB/GYN as soon as possible.  Return to the ED if you develop any worsening symptoms.  Lab work looks reassuring.

## 2017-01-04 NOTE — ED Notes (Signed)
Pt verbalized understanding discharge instructions and denies any further needs or questions at this time. VS stable, ambulatory and steady gait.   

## 2017-01-04 NOTE — ED Triage Notes (Signed)
Pt c/o history of staph infections. Reports abscess to left groin area and labia area onset last night. Pt reports had few different areas lanced in the past month and has been on 3 different antibiotics.

## 2017-01-06 NOTE — ED Provider Notes (Signed)
[facility] EMERGENCY DEPARTMENT Provider Note   CSN: [id number] Arrival date & time: 01/04/17  1046     History   Chief Complaint Chief Complaint  Patient presents with  . Abscess    HPI Lori Callahan is a 31 y.o. female.  HPI 31 year old female with no pertinent past medical history presents to the emergency department today with complaints of labial abscess.  Patient states for the past month and a half she has had several labial abscesses in the same spot.  She has had this drained several times by her OB/GYN doctor.  She does report a recent C-section 2-1/2 months ago.  Patient states that her OB/GYN doctor has been draining her labial abscesses and did culture the other day.  States that it did not grow back MRSA however the doctor was worried about MRSA.  Patient has been on several antibiotics over the past month including Keflex, Augmentin and clindamycin.  Patient states that she just finished clindamycin approximately 5 days ago.  States that the area was getting better however last night she started developing pain in the left labia and noticed that the abscess was coming back.  States that this area has had to be lanced several times in the past with purulent drainage noted.  Patient denies any other associated symptoms including abdominal pain, vaginal discharge, vaginal bleeding, pelvic pain, fevers, chills, nausea, emesis, urinary symptoms, change in bowel habits.  Nothing makes better or worse.  She has not tried any of her symptoms prior to arrival.  Patient is not breast-feeding at this time. Past Medical History:  Diagnosis Date  . Anxiety    no meds  . GERD (gastroesophageal reflux disease)   . Herpes genitalia   . HPV (human papilloma virus) infection   . PONV (postoperative nausea and vomiting)     Patient Active Problem List   Diagnosis Date Noted  . Normal intrauterine pregnancy in third trimester 10/22/2016  . [redacted] weeks gestation of  pregnancy   . Abnormal findings on antenatal screening     Past Surgical History:  Procedure Laterality Date  . CESAREAN SECTION    . CESAREAN SECTION  03/02/2012   Procedure: CESAREAN SECTION;  Surgeon: Sharene Butters, MD;  Location: [facility];  Service: Obstetrics;  Laterality: N/A;  . CESAREAN SECTION N/A 10/22/2016   Procedure: CESAREAN SECTION;  Surgeon: Janyth Pupa, DO;  Location: [facility];  Service: Obstetrics;  Laterality: N/A;  . TONSILLECTOMY AND ADENOIDECTOMY    . TUBAL LIGATION Bilateral 10/22/2016   Procedure: POST PARTUM TUBAL LIGATION;  Surgeon: Janyth Pupa, DO;  Location: [facility];  Service: Obstetrics;  Laterality: Bilateral;    OB History    Gravida Para Term Preterm AB Living   3 3 3     3    SAB TAB Ectopic Multiple Live Births         0 3       Home Medications    Prior to Admission medications   Medication Sig Start Date End Date Taking? Authorizing Provider  cephALEXin (KEFLEX) 500 MG capsule Take 1 capsule (500 mg total) by mouth 4 (four) times daily. 01/04/17   Doristine Devoid, PA-C  docusate sodium (COLACE) 100 MG capsule Take 1 capsule (100 mg total) by mouth 2 (two) times daily. Patient not taking: Reported on 01/04/2017 10/24/16   Janyth Pupa, DO  doxycycline (VIBRAMYCIN) 100 MG capsule Take 1 capsule (100 mg total) by mouth 2 (two) times  daily. 01/04/17   Doristine Devoid, PA-C  HYDROmorphone (DILAUDID) 2 MG tablet Take 1 tablet (2 mg total) by mouth every 6 (six) hours as needed for severe pain. Patient not taking: Reported on 01/04/2017 10/24/16   Janyth Pupa, DO  ibuprofen (ADVIL,MOTRIN) 600 MG tablet Take 1 tablet (600 mg total) by mouth every 6 (six) hours. Patient not taking: Reported on 01/04/2017 10/24/16   Janyth Pupa, DO    Family History Family History  Problem Relation Age of Onset  . Cancer Maternal Grandmother     Social History Social History  Substance Use Topics  . Smoking status: Former  Smoker    Packs/day: 0.50    Years: 10.00    Types: Cigarettes    Quit date: 09/13/2011  . Smokeless tobacco: Never Used  . Alcohol use No     Allergies   Reglan [metoclopramide]   Review of Systems Review of Systems  Constitutional: Negative for chills and fever.  Gastrointestinal: Negative for abdominal pain, blood in stool, diarrhea, nausea and vomiting.  Genitourinary: Positive for vaginal pain. Negative for dysuria, flank pain, frequency, genital sores, hematuria, menstrual problem, pelvic pain, urgency, vaginal bleeding and vaginal discharge.  Skin: Positive for color change. Negative for rash and wound.     Physical Exam Updated Vital Signs BP 137/69 (BP Location: Left Arm)   Pulse 60   Temp 97.9 F (36.6 C) (Oral)   Resp 15   Ht 5\' 5"  (1.651 m)   Wt 99.8 kg (220 lb)   LMP 12/21/2016   SpO2 100%   Breastfeeding? No   BMI 36.61 kg/m   Physical Exam  Constitutional: She is oriented to person, place, and time. She appears well-developed and well-nourished.  Non-toxic appearance. No distress.  HENT:  Head: Normocephalic and atraumatic.  Nose: Nose normal.  Mouth/Throat: Oropharynx is clear and moist.  Eyes: Pupils are equal, round, and reactive to light. Conjunctivae are normal. Right eye exhibits no discharge. Left eye exhibits no discharge.  Neck: Normal range of motion. Neck supple.  Cardiovascular: Normal rate, regular rhythm, normal heart sounds and intact distal pulses.   Pulmonary/Chest: Effort normal and breath sounds normal. No respiratory distress. She exhibits no tenderness.  Abdominal: Soft. Bowel sounds are normal. There is no tenderness. There is no rigidity, no rebound, no guarding, no CVA tenderness, no tenderness at McBurney's point and negative Murphy's sign.  Genitourinary:  Genitourinary Comments: Chaperone present for exam.  Patient does have induration and erythema to the left labia.  Not consistent with Bartholin's abscess.  Area with 1 x 1 cm  area of fluctuance.  Mild warmth noted.  No other lesions noted.  No erythema, discharge, bleeding, or lesions noted in the vaginal vault. No CMT tenderness, bleeding or friability. No adnexal tenderness, mass or fullness bilaterally. No inguinal adenopathy or hernia.    Musculoskeletal: Normal range of motion. She exhibits no tenderness.  Lymphadenopathy:    She has no cervical adenopathy.  Neurological: She is alert and oriented to person, place, and time.  Skin: Skin is warm and dry. Capillary refill takes less than 2 seconds.  Psychiatric: Her behavior is normal. Judgment and thought content normal.  Nursing note and vitals reviewed.    ED Treatments / Results  Labs (all labs ordered are listed, but only abnormal results are displayed) Labs Reviewed  COMPREHENSIVE METABOLIC PANEL - Abnormal; Notable for the following:       Result Value   BUN 5 (*)    All  other components within normal limits  CBC WITH DIFFERENTIAL/PLATELET    EKG  EKG Interpretation None       Radiology No results found.  Procedures .Marland KitchenIncision and Drainage Date/Time: 01/08/2017 11:06 AM Performed by: Ocie Cornfield T Authorized by: Ocie Cornfield T   Consent:    Consent obtained:  Verbal   Consent given by:  Patient   Risks discussed:  Bleeding, damage to other organs, infection, incomplete drainage and pain   Alternatives discussed:  No treatment Location:    Type:  Abscess   Location:  Anogenital   Anogenital location: left labia majora. Pre-procedure details:    Skin preparation:  Betadine Anesthesia (see MAR for exact dosages):    Anesthesia method:  Local infiltration   Local anesthetic:  Lidocaine 1% WITH epi and lidocaine 1% w/o epi Procedure type:    Complexity:  Simple Procedure details:    Incision types:  Stab incision   Incision depth:  Dermal   Scalpel blade:  11   Wound management:  Probed and deloculated and irrigated with saline   Drainage:  Bloody and purulent    Drainage amount:  Moderate   Wound treatment:  Wound left open   Packing materials:  1/4 in iodoform gauze Post-procedure details:    Patient tolerance of procedure:  Tolerated well, no immediate complications   (including critical care time)  Medications Ordered in ED Medications  sodium chloride 0.9 % bolus 1,000 mL (0 mLs Intravenous Stopped 01/04/17 1642)  fentaNYL (SUBLIMAZE) injection 50 mcg (50 mcg Intravenous Given 01/04/17 1530)  lidocaine (PF) (XYLOCAINE) 1 % injection 30 mL (30 mLs Infiltration Given 01/04/17 1530)  cefTRIAXone (ROCEPHIN) 1 g in dextrose 5 % 50 mL IVPB (0 g Intravenous Stopped 01/04/17 1628)     Initial Impression / Assessment and Plan / ED Course  I have reviewed the triage vital signs and the nursing notes.  Pertinent labs & imaging results that were available during my care of the patient were reviewed by me and considered in my medical decision making (see chart for details).     Patient presents to the ED with complaints of labial abscess.  This is been drained several times over the past month she has been on several antibiotics by her OB/GYN doctor.  Patient states the abscess keeps returning.  Has never been packed before.  States the antibiotics do help initially however it returns.  Bedside ultrasound was used by myself and my attending and found small area of drainable abscess.  Patient with skin abscess amenable to incision and drainage.  Abscess was large enough to warrant packing or drain,  wound recheck in 2 days. Encouraged home warm soaks and flushing.  Basic lab work was unremarkable.  Patient has no associated symptoms of abdominal pain, vaginal discharge, vaginal bleeding, pelvic pain, fever, chills.  Doubt any other acute pathology.  Given location of abscess will start patient on antibiotics to cover for MRSA.  Patient has been on [place] recently.  Will switch to Doxy and double coverage with Keflex given the recurrent nature of the abscesses.   Needs close follow-up with her OB/GYN doctor and may need general surgery follow-up in the future if it is not improving.  Pt is hemodynamically stable, in NAD, & able to ambulate in the ED. Evaluation does not show pathology that would require ongoing emergent intervention or inpatient treatment. I explained the diagnosis to the patient. Pain has been managed & has no complaints prior to dc. Pt is  comfortable with above plan and is stable for discharge at this time. All questions were answered prior to disposition. Strict return precautions for f/u to the ED were discussed. Encouraged follow up with PCP.  Patient seen and evaluated with my attending Dr. Billy Fischer who is agreeable the above plan.   Final Clinical Impressions(s) / ED Diagnoses   Final diagnoses:  Labial abscess    New Prescriptions Discharge Medication List as of 01/04/2017  4:26 PM    START taking these medications   Details  cephALEXin (KEFLEX) 500 MG capsule Take 1 capsule (500 mg total) by mouth 4 (four) times daily., Starting Sat 01/04/2017, Print    doxycycline (VIBRAMYCIN) 100 MG capsule Take 1 capsule (100 mg total) by mouth 2 (two) times daily., Starting Sat 01/04/2017, Print         Doristine Devoid, PA-C 01/08/17 1109    Gareth Morgan, MD 01/09/17 [phone]

## 2017-03-22 ENCOUNTER — Emergency Department (HOSPITAL_COMMUNITY): Payer: Worker's Compensation

## 2017-03-22 ENCOUNTER — Encounter (HOSPITAL_COMMUNITY): Payer: Self-pay | Admitting: Emergency Medicine

## 2017-03-22 ENCOUNTER — Other Ambulatory Visit: Payer: Self-pay

## 2017-03-22 ENCOUNTER — Emergency Department (HOSPITAL_COMMUNITY)
Admission: EM | Admit: 2017-03-22 | Discharge: 2017-03-22 | Disposition: A | Discharge status: Home / Self Care | Payer: Worker's Compensation | Attending: Emergency Medicine | Admitting: Emergency Medicine

## 2017-03-22 DIAGNOSIS — Z87891 Personal history of nicotine dependence: Secondary | ICD-10-CM | POA: Diagnosis not present

## 2017-03-22 DIAGNOSIS — Y9289 Other specified places as the place of occurrence of the external cause: Secondary | ICD-10-CM | POA: Insufficient documentation

## 2017-03-22 DIAGNOSIS — Y990 Civilian activity done for income or pay: Secondary | ICD-10-CM | POA: Insufficient documentation

## 2017-03-22 DIAGNOSIS — W5501XA Bitten by cat, initial encounter: Secondary | ICD-10-CM | POA: Insufficient documentation

## 2017-03-22 DIAGNOSIS — S61451A Open bite of right hand, initial encounter: Secondary | ICD-10-CM | POA: Diagnosis not present

## 2017-03-22 DIAGNOSIS — Y9389 Activity, other specified: Secondary | ICD-10-CM | POA: Diagnosis not present

## 2017-03-22 DIAGNOSIS — S6991XA Unspecified injury of right wrist, hand and finger(s), initial encounter: Secondary | ICD-10-CM | POA: Diagnosis present

## 2017-03-22 MED ORDER — AMOXICILLIN-POT CLAVULANATE 875-125 MG PO TABS: 1.0000 | ORAL_TABLET | Freq: Two times a day (BID) | ORAL | 0 refills | Status: DC

## 2017-03-22 MED ORDER — AMOXICILLIN-POT CLAVULANATE 875-125 MG PO TABS
1.0000 | ORAL_TABLET | Freq: Once | ORAL | Status: AC
  Administered 2017-03-22: 1 via ORAL
  Filled 2017-03-22: qty 1

## 2017-03-22 MED ORDER — BACITRACIN ZINC 500 UNIT/GM EX OINT: 1.0000 "application " | TOPICAL_OINTMENT | Freq: Two times a day (BID) | CUTANEOUS | 1 refills | Status: DC

## 2017-03-22 MED ORDER — BACITRACIN ZINC 500 UNIT/GM EX OINT
1.0000 "application " | TOPICAL_OINTMENT | Freq: Two times a day (BID) | CUTANEOUS | Status: DC
  Administered 2017-03-22: 1 via TOPICAL
  Filled 2017-03-22: qty 0.9

## 2017-03-22 NOTE — ED Notes (Signed)
Pt. alert & interactive during discharge; pt. ambulatory to exit 

## 2017-03-22 NOTE — ED Provider Notes (Signed)
[facility] EMERGENCY DEPARTMENT Provider Note   CSN: [id number] Arrival date & time: 03/22/17  0219     History   Chief Complaint Chief Complaint  Patient presents with  . Animal Bite    HPI Lori Callahan is a 32 y.o. female.  Lori Callahan is a 32 y.o. Female who presents to the emergency department after a cat bite prior to arrival.  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  She is bit around the dorsum of her right hand.  She is right-hand dominant.  She denies any pain.  She reports the cat is unvaccinated and they are working on vaccinating at today.  She reports the cat will be in quarantine to watch for signs of rabies.  She reports her last tetanus shot was less than 5 years ago.  She denies any numbness, tingling or weakness.  No treatments prior to arrival.   The history is provided by the patient and medical records. No language interpreter was used.  Animal Bite  Associated symptoms: no fever, no numbness and no rash     Past Medical History:  Diagnosis Date  . Anxiety    no meds  . GERD (gastroesophageal reflux disease)   . Herpes genitalia   . HPV (human papilloma virus) infection   . PONV (postoperative nausea and vomiting)     Patient Active Problem List   Diagnosis Date Noted  . Normal intrauterine pregnancy in third trimester 10/22/2016  . [redacted] weeks gestation of pregnancy   . Abnormal findings on antenatal screening     Past Surgical History:  Procedure Laterality Date  . CESAREAN SECTION    . CESAREAN SECTION  03/02/2012   Procedure: CESAREAN SECTION;  Surgeon: Sharene Butters, MD;  Location: [facility];  Service: Obstetrics;  Laterality: N/A;  . CESAREAN SECTION N/A 10/22/2016   Procedure: CESAREAN SECTION;  Surgeon: Janyth Pupa, DO;  Location: [facility];  Service: Obstetrics;  Laterality: N/A;  . TONSILLECTOMY AND ADENOIDECTOMY    . TUBAL  LIGATION Bilateral 10/22/2016   Procedure: POST PARTUM TUBAL LIGATION;  Surgeon: Janyth Pupa, DO;  Location: [facility];  Service: Obstetrics;  Laterality: Bilateral;    OB History    Gravida Para Term Preterm AB Living   3 3 3     3    SAB TAB Ectopic Multiple Live Births         0 3       Home Medications    Prior to Admission medications   Medication Sig Start Date End Date Taking? Authorizing Provider  amoxicillin-clavulanate (AUGMENTIN) 875-125 MG tablet Take 1 tablet by mouth every 12 (twelve) hours. 03/22/17   Waynetta Pean, PA-C  bacitracin ointment Apply 1 application topically 2 (two) times daily. 03/22/17   Waynetta Pean, PA-C  docusate sodium (COLACE) 100 MG capsule Take 1 capsule (100 mg total) by mouth 2 (two) times daily. Patient not taking: Reported on 01/04/2017 10/24/16   Janyth Pupa, DO  HYDROmorphone (DILAUDID) 2 MG tablet Take 1 tablet (2 mg total) by mouth every 6 (six) hours as needed for severe pain. Patient not taking: Reported on 01/04/2017 10/24/16   Janyth Pupa, DO  ibuprofen (ADVIL,MOTRIN) 600 MG tablet Take 1 tablet (600 mg total) by mouth every 6 (six) hours. Patient not taking: Reported on 01/04/2017 10/24/16   Janyth Pupa, DO    Family  History Family History  Problem Relation Age of Onset  . Cancer Maternal Grandmother     Social History Social History   Tobacco Use  . Smoking status: Former Smoker    Packs/day: 0.50    Years: 10.00    Pack years: 5.00    Types: Cigarettes    Last attempt to quit: 09/13/2011    Years since quitting: 5.5  . Smokeless tobacco: Never Used  Substance Use Topics  . Alcohol use: No  . Drug use: Yes    Types: Marijuana    Comment: Last use summer 2012     Allergies   Reglan [metoclopramide]   Review of Systems Review of Systems  Constitutional: Negative for fever.  Musculoskeletal: Negative for arthralgias and joint swelling.  Skin: Positive for wound. Negative for color change and  rash.  Neurological: Negative for weakness and numbness.     Physical Exam Updated Vital Signs BP 117/79 (BP Location: Right Arm)   Pulse 88   Temp 98 F (36.7 C) (Oral)   Resp 20   Ht 5\' 5"  (1.651 m)   Wt 90.7 kg (200 lb)   LMP 03/16/2017 (Exact Date)   SpO2 99%   BMI 33.28 kg/m   Physical Exam  Constitutional: She appears well-developed and well-nourished. No distress.  HENT:  Head: Normocephalic and atraumatic.  Eyes: Right eye exhibits no discharge. Left eye exhibits no discharge.  Cardiovascular: Normal rate, regular rhythm and intact distal pulses.  Bilateral radial pulses are intact.  Pulmonary/Chest: Effort normal. No respiratory distress.  Musculoskeletal: Normal range of motion. She exhibits no edema, tenderness or deformity.  Very small puncture wound noted to the dorsum of her right hand near her thumb.  No bleeding.  No discharge.  Good range of motion of her hand without difficulty.  No bony point tenderness.  No erythema or edema.  Neurological: She is alert. No sensory deficit. Coordination normal.  Skin: Skin is warm and dry. No rash noted. She is not diaphoretic. No erythema. No pallor.  Psychiatric: She has a normal mood and affect. Her behavior is normal.  Nursing note and vitals reviewed.    ED Treatments / Results  Labs (all labs ordered are listed, but only abnormal results are displayed) Labs Reviewed - No data to display  EKG  EKG Interpretation None       Radiology Dg Hand Complete Right  Result Date: 03/22/2017 CLINICAL DATA:  Cat bite EXAM: RIGHT HAND - COMPLETE 3+ VIEW COMPARISON:  None. FINDINGS: There is no evidence of fracture or dislocation. There is no evidence of arthropathy or other focal bone abnormality. Soft tissues are unremarkable. IMPRESSION: Negative. Electronically Signed   By: Donavan Foil M.D.   On: 03/22/2017 03:57    Procedures Procedures (including critical care time)  Medications Ordered in ED Medications    bacitracin ointment 1 application (1 application Topical Given 03/22/17 0341)  amoxicillin-clavulanate (AUGMENTIN) 875-125 MG per tablet 1 tablet (1 tablet Oral Given 03/22/17 0352)     Initial Impression / Assessment and Plan / ED Course  I have reviewed the triage vital signs and the nursing notes.  Pertinent labs & imaging results that were available during my care of the patient were reviewed by me and considered in my medical decision making (see chart for details).     This is a 32 y.o. Female who presents to the emergency department after a cat bite prior to arrival.  Patient reports she works at a [employer], [employer]  in the emergency clinic when she was bit by a cat just prior to arrival.  She is bit around the dorsum of her right hand.  She is right-hand dominant.  She denies any pain.  She reports the cat is unvaccinated and they are working on vaccinating at today.  She reports the cat will be in quarantine to watch for signs of rabies.  She reports her last tetanus shot was less than 5 years ago.  She denies any numbness, tingling or weakness. On exam the patient is afebrile nontoxic-appearing.  She has a small puncture wound to the dorsum of her right hand near her thumb.  Good range of motion of her digits without difficulty.  No sensation deficit.  Good radial pulse.  No discharge or erythema. X-rays obtained to rule out foreign body.  No evidence of any foreign body or bony abnormality. The wound was cleaned and dressed with bacitracin.  I discussed wound care instructions.  Patient received her first dose of Augmentin in the emergency department.  I discussed wound care and advised to complete a course of Augmentin.  Strict and specific return precautions discussed.  Close follow-up with PCP for recheck. I advised the patient to follow-up with their primary care provider this week. I advised the patient to return to the emergency department with new or worsening symptoms or new concerns. The  patient verbalized understanding and agreement with plan.        Final Clinical Impressions(s) / ED Diagnoses   Final diagnoses:  Cat bite of right hand, initial encounter    ED Discharge Orders        Ordered    amoxicillin-clavulanate (AUGMENTIN) 875-125 MG tablet  Every 12 hours     03/22/17 0430    bacitracin ointment  2 times daily     03/22/17 0430       Waynetta Pean, PA-C 03/22/17 0434    Veryl Speak, MD 03/22/17 [phone]

## 2017-03-22 NOTE — ED Notes (Signed)
Awaiting augmentin from main pharmacy; msg sent

## 2017-03-22 NOTE — ED Notes (Signed)
Patient transported to X-ray 

## 2017-03-22 NOTE — ED Notes (Signed)
Pt returned from xray

## 2017-03-22 NOTE — ED Triage Notes (Signed)
Pt reports being bit by a cat at work approx 1 hr ago, small puncture wound noted to R hand. No bleeding, redness, swelling, bruising.  Tetanus not up to date

## 2018-04-18 IMAGING — US US MFM OB DETAIL+14 WK
1 series · 14 of 28 positions shown · non-contrast
Comparison: none

[Series 1: us mfm ob detail+14 wk · 67 acquisitions, 14 frames shown]
[im 3/67]
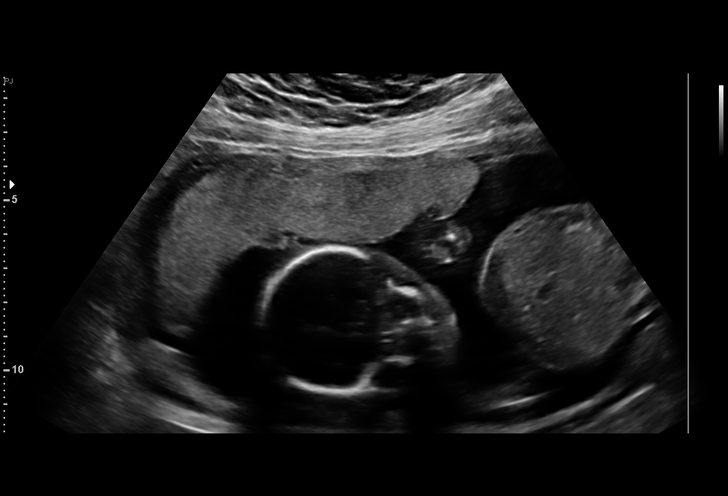
[im 8/67]
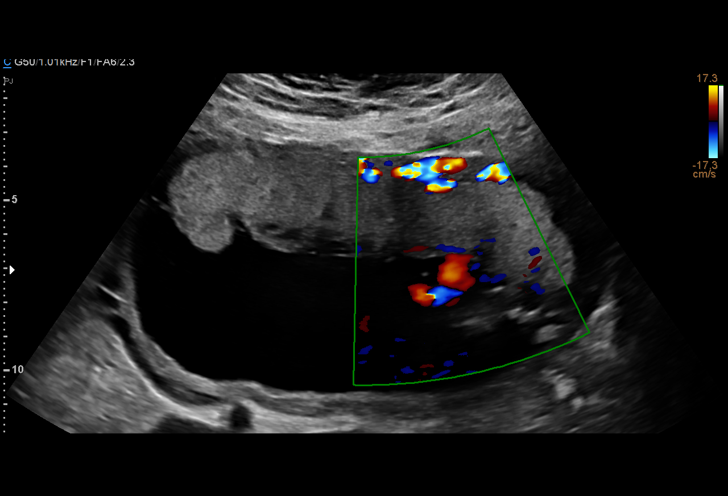
[im 13/67]
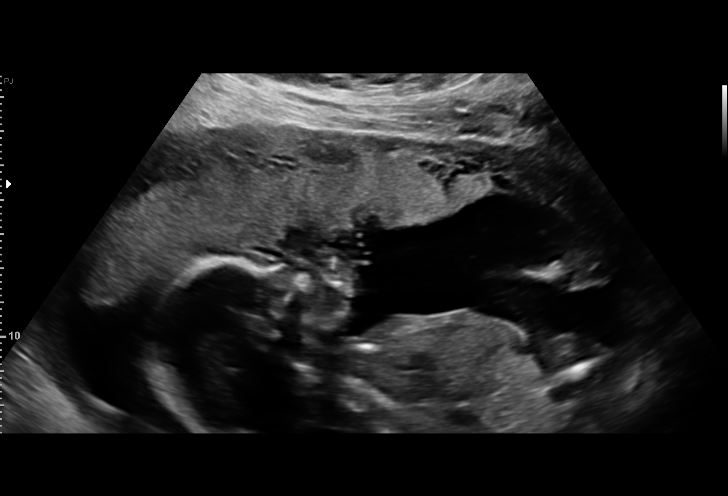
[im 18/67]
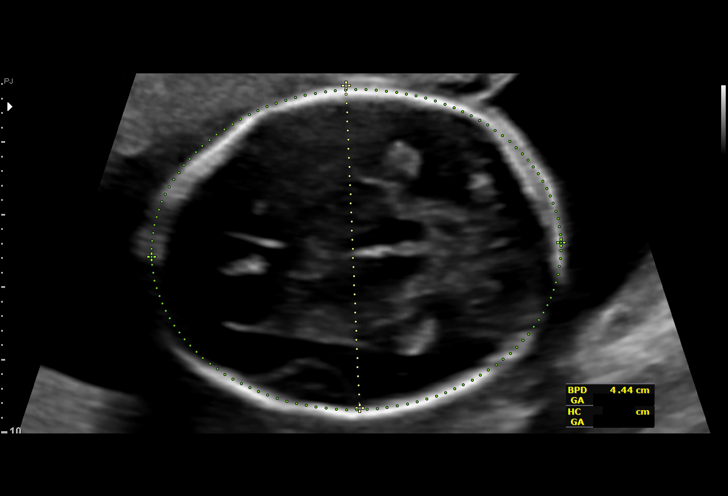
[im 23/67]
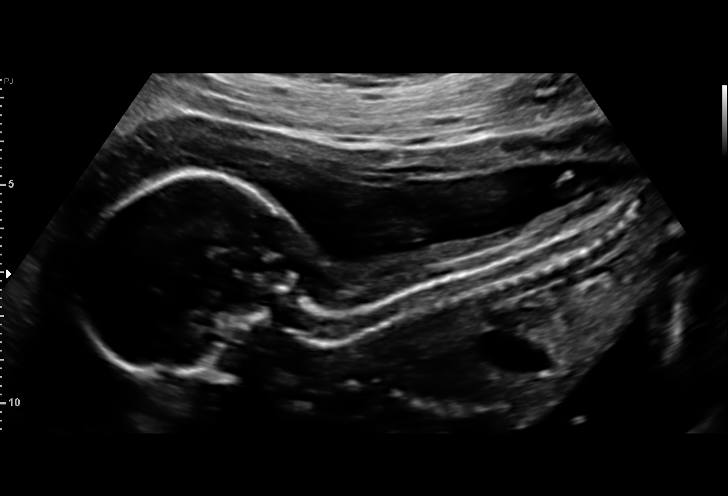
[im 27/67]
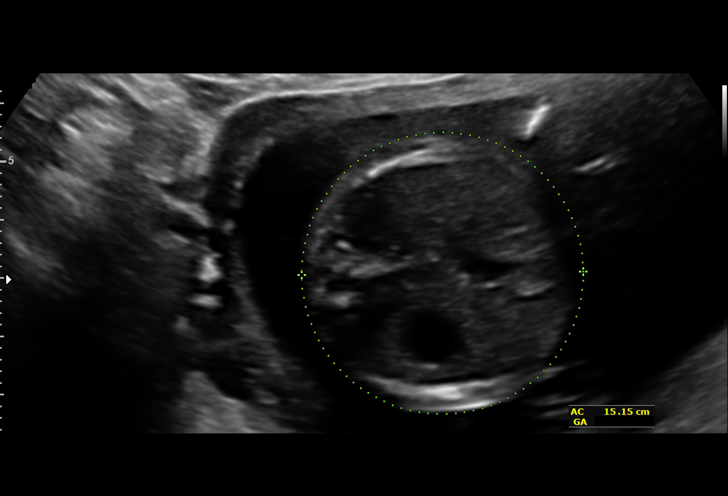
[im 32/67]
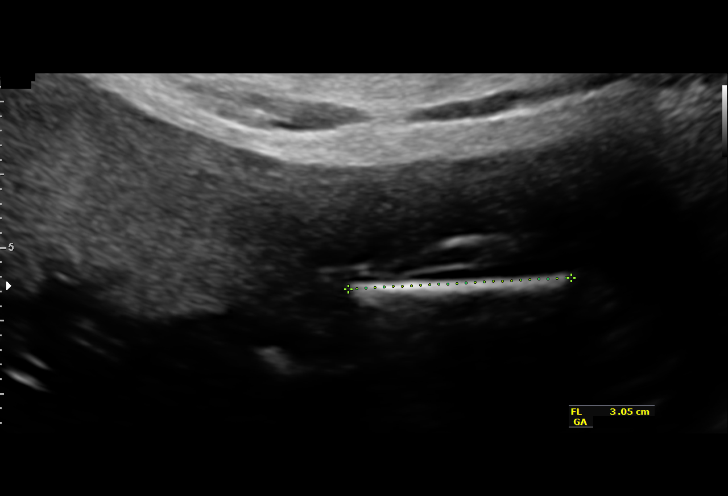
[im 37/67]
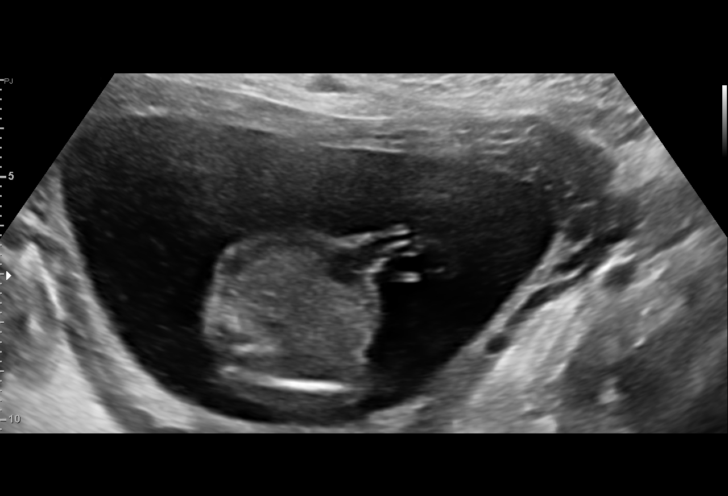
[im 42/67]
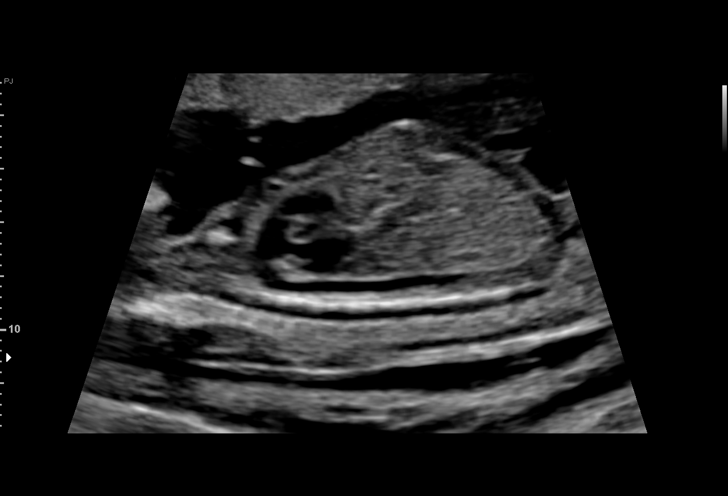
[im 47/67]
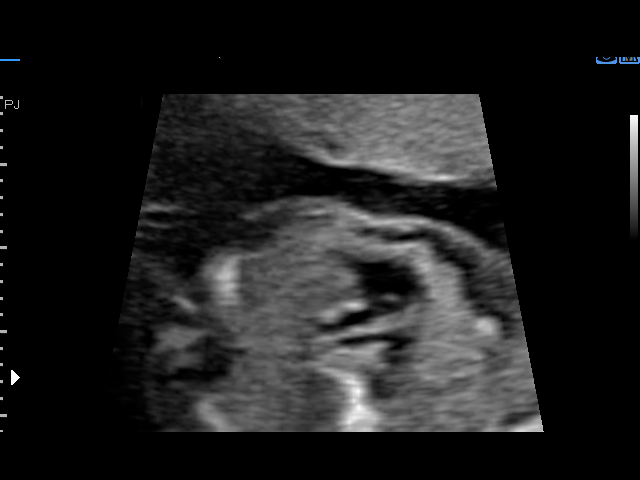
[im 52/67]
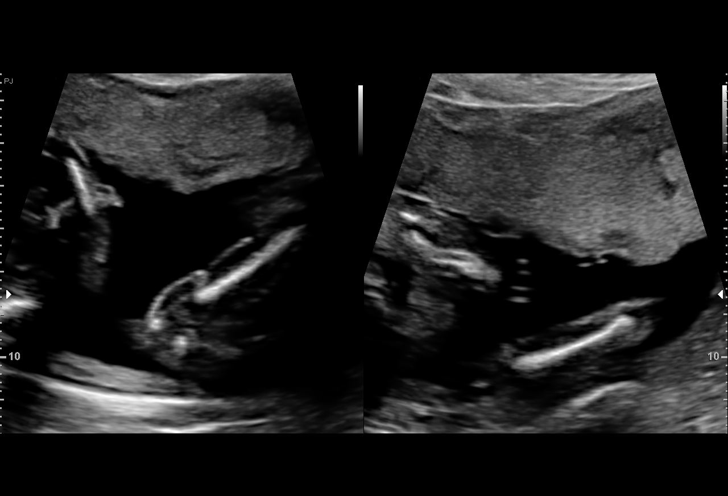
[im 57/67]
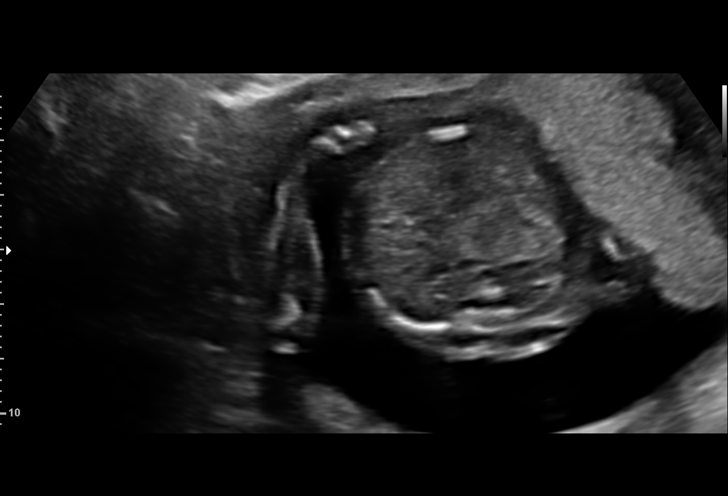
[im 62/67]
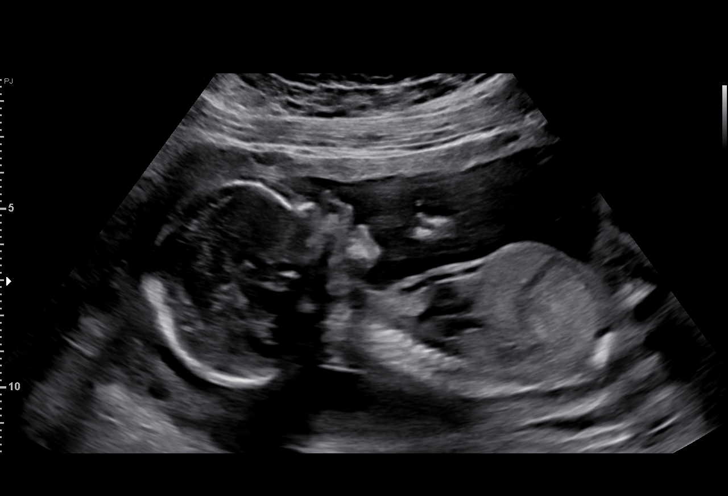
[im 67/67]
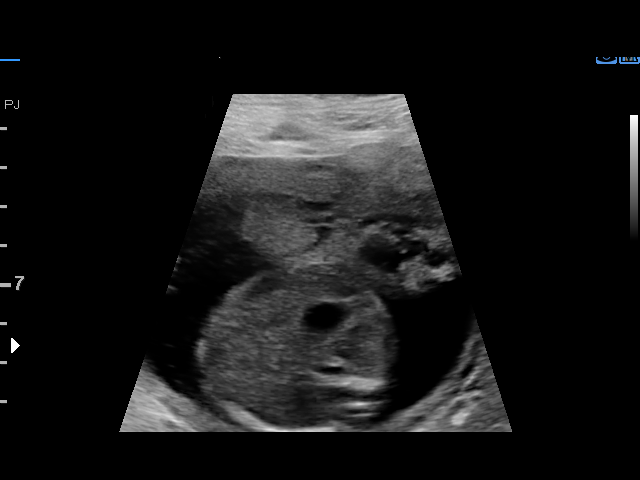

[14 of 28 positions shown; findings below may reference images not displayed]

Name:       KIMIYASU ARVIN                 Visit Date:  06/05/2016 [DATE]

Indications

19 weeks gestation of pregnancy
Encounter for antenatal screening for
malformations
Abnormal biochemical screen (quad) for
Trisomy 21([DATE])
Previous cesarean delivery, antepartum x2
OB History

Blood Type:            Height:  5'5"   Weight (lb):  220      BMI:
Gravidity:    3         Term:   2        Prem:   0        SAB:   0
TOP:          0       Ectopic:  0        Living: 2
Fetal Evaluation

Num Of Fetuses:     1
Fetal Heart         148
Rate(bpm):
Cardiac Activity:   Observed
Presentation:       Variable
Placenta:           Anterior, above cervical os
P. Cord Insertion:  Visualized

Amniotic Fluid
AFI FV:      Subjectively within normal limits

Largest Pocket(cm)
4.7
Biometry
BPD:      44.5  mm     G. Age:  19w 3d         58  %    CI:        77.25   %   70 - 86
FL/HC:      18.6   %   16.1 -
HC:      160.3  mm     G. Age:  18w 6d         23  %    HC/AC:      1.07       1.09 -
AC:      150.2  mm     G. Age:  20w 2d         77  %    FL/BPD:     67.0   %
FL:       29.8  mm     G. Age:  19w 2d         40  %    FL/AC:      19.8   %   20 - 24
HUM:      29.1  mm     G. Age:  19w 3d         56  %
CER:      20.2  mm     G. Age:  19w 1d         49  %
NFT:       3.8  mm

CM:        5.2  mm

Est. FW:     306  gm    0 lb 11 oz      52  %
Gestational Age

LMP:           20w 2d       Date:   01/15/16                 EDD:   10/21/16
U/S Today:     19w 3d                                        EDD:   10/27/16
Best:          19w 2d    Det. By:   Early Ultrasound         EDD:   10/28/16
(04/08/16)
Anatomy

Cranium:               Appears normal         Aortic Arch:            Appears normal
Cavum:                 Appears normal         Ductal Arch:            Appears normal
Ventricles:            Appears normal         Diaphragm:              Appears normal
Choroid Plexus:        Appears normal         Stomach:                Appears normal, left
sided
Cerebellum:            Appears normal         Abdomen:                Appears normal
Posterior Fossa:       Appears normal         Abdominal Wall:         Appears nml (cord
insert, abd wall)
Nuchal Fold:           Appears normal         Cord Vessels:           Appears normal (3
vessel cord)
Face:                  Appears normal         Kidneys:                Appear normal
(orbits and profile)
Lips:                  Appears normal         Bladder:                Appears normal
Thoracic:              Appears normal         Spine:                  Appears normal
Heart:                 Appears normal         Upper Extremities:      Appears normal
(4CH, axis, and situs
RVOT:                  Appears normal         Lower Extremities:      Appears normal
LVOT:                  Appears normal

Other:  Fetus appears to be a female. Heels visualized. Technically difficult
due to maternal habitus and fetal position.
Cervix Uterus Adnexa

Cervix
Length:            3.1  cm.
Normal appearance by transabdominal scan.

Uterus
No abnormality visualized.

Left Ovary
Not visualized. No adnexal mass visualized.

Right Ovary
Not visualized. No adnexal mass visualized.
Impression

Singleton intrauterine pregnancy at 19+2 weeks, with
abnormal maternal serum screening
Review of the anatomy shows no sonographic markers for
aneuploidy or structural anomalies
Amniotic fluid volume is normal
Estimated fetal weight is 306g which is growth in the 52nd
percentile
Recommendations

To see genetic counseling today.
Follow-up ultrasounds as clinically indicated.

## 2019-01-27 ENCOUNTER — Other Ambulatory Visit: Payer: Self-pay

## 2019-01-27 DIAGNOSIS — Z20822 Contact with and (suspected) exposure to covid-19: Secondary | ICD-10-CM

## 2019-01-29 LAB — SPECIMEN STATUS REPORT

## 2019-01-29 LAB — NOVEL CORONAVIRUS, NAA: SARS-CoV-2, NAA: NOT DETECTED

## 2019-05-03 LAB — TSH: TSH: 5.45 (ref 0.41–5.90)

## 2019-05-03 LAB — BASIC METABOLIC PANEL
BUN: 8 (ref 4–21)
CO2: 27 — AB (ref 13–22)
Chloride: 107 (ref 99–108)
Creatinine: 0.7 (ref 0.5–1.1)
Glucose: 91
Potassium: 4.4 (ref 3.4–5.3)
Sodium: 139 (ref 137–147)

## 2019-05-03 LAB — LIPID PANEL
Cholesterol: 170 (ref 0–200)
HDL: 61 (ref 35–70)
LDL Cholesterol: 93
Triglycerides: 83 (ref 40–160)

## 2019-05-03 LAB — HEPATIC FUNCTION PANEL
ALT: 19 (ref 7–35)
AST: 18 (ref 13–35)
Alkaline Phosphatase: 44 (ref 25–125)
Bilirubin, Total: 0.3

## 2019-05-03 LAB — CBC AND DIFFERENTIAL
HCT: 42 (ref 36–46)
Hemoglobin: 14.1 (ref 12.0–16.0)
Platelets: 199 (ref 150–399)
WBC: 7.2

## 2019-05-03 LAB — COMPREHENSIVE METABOLIC PANEL
Albumin: 4.4 (ref 3.5–5.0)
Calcium: 9.7 (ref 8.7–10.7)
GFR calc non Af Amer: 93

## 2019-05-03 LAB — CBC: RBC: 4.52 (ref 3.87–5.11)

## 2019-05-25 ENCOUNTER — Ambulatory Visit (INDEPENDENT_AMBULATORY_CARE_PROVIDER_SITE_OTHER): Payer: 59 | Admitting: Psychiatry

## 2019-05-25 ENCOUNTER — Other Ambulatory Visit: Payer: Self-pay

## 2019-05-25 ENCOUNTER — Encounter: Payer: Self-pay | Admitting: Psychiatry

## 2019-05-25 DIAGNOSIS — F419 Anxiety disorder, unspecified: Secondary | ICD-10-CM

## 2019-05-25 DIAGNOSIS — F3181 Bipolar II disorder: Secondary | ICD-10-CM | POA: Insufficient documentation

## 2019-05-25 MED ORDER — LAMOTRIGINE 25 MG PO TABS: ORAL_TABLET | ORAL | 0 refills | Status: DC

## 2019-05-25 MED ORDER — TRAZODONE HCL 50 MG PO TABS: 50.0000 mg | ORAL_TABLET | Freq: Every evening | ORAL | 1 refills | Status: DC | PRN

## 2019-05-25 NOTE — Progress Notes (Signed)
Psychiatric Initial Adult Assessment   I connected with  Nalin Mullineaux Ake on 05/25/19 by a video enabled telemedicine application and verified that I am speaking with the correct person using two identifiers.   I discussed the limitations of evaluation and management by telemedicine. The patient expressed understanding and agreed to proceed.    Patient Identification: Lori Callahan MRN:  [id number] Date of Evaluation:  05/25/2019   Referral Source: PCP Ms. Yonjof  Chief Complaint: " I have had issues with anxiety for a long time."     Visit Diagnosis:    ICD-10-CM   1. Bipolar 2 disorder (HCC)  F31.81   2. Anxiety  F41.9     History of Present Illness: This is a 34 year old female with history of anxiety and depression now referred for psychiatry evaluation due to ongoing symptoms of distractibility and poor concentration. Patient reported that she has dealt with anxiety since a young age starting around 67 or 89 when she was in middle school.  She was diagnosed with depression anxiety when she was in high school and was prescribed several different medications.  She stated that the medications were Lexapro, Zoloft, Cymbalta, Wellbutrin and none of them really helped and caused her to have side effects.  She could not recall which one but stated that one of them made her pull out her eyebrows and eyelashes. Patient stated that her father committed suicide when she was 37 years old and her mother never told her about this.  She grew up thinking her stepfather was her father.  Patient stated that she found out regarding this from her paternal aunt when she was around 7 or 63 years of age.  That caused her to feel upset and anxious. She stated that as she grew older things got a little better and she somehow managed.  However she has noticed that after she had her daughter about 2 years ago her emotions have been quite intense.  She stated that she feels her mood is irritable and can be labile.   She has not noticed any particular depressive symptoms like low energy, anhedonia or decreased appetite.  However she is having a very hard time staying focused on any task. She stated that lately she has noticed that her mood is all over the place and she can have a strong emotions.  She stated that little things can make her cry and be upset.  She has noticed that she is easily distracted and cannot complete any of her tasks.  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 She has periods of time when she is very energetic and feels that she does not need to sleep and can stay up all night cleaning her kitchen. She stated that for the past few months she has noticed her mind wanders off easily and she has racing thoughts.  She has a hard time staying on topic during conversations and she jumps from 1 topic to another.  She makes impulsive decisions and will by things that she does not need.  She will sign up for different things but then later regrets why she did that.  She has a whole closet of art and craft work as she keeps changing her interests and when she gets interested in something she  will buy things related to it and start working on it for a few days but the never complete anything. She has a hard time helping her children with her schoolwork while living homeschooled during the pandemic. She stated that she can have hypersexual impulses towards her husband. She denied any suicidal ideations or prior suicide attempts.  She denied symptom suggestive of psychosis or PTSD.  Associated Signs/Symptoms: Depression Symptoms:  denied (Hypo) Manic Symptoms:  Distractibility, Elevated Mood, Flight of Ideas, Community education officer, Impulsivity, Irritable Mood, Labiality of Mood, Anxiety Symptoms:  Excessive  Worry, Psychotic Symptoms:  denied PTSD Symptoms: Negative  Past Psychiatric History: anxiety, depression  Previous Psychotropic Medications: Yes   Substance Abuse History in the last 12 months:  No.  Consequences of Substance Abuse: Negative  Past Medical History:  Past Medical History:  Diagnosis Date  . Anxiety    no meds  . GERD (gastroesophageal reflux disease)   . Herpes genitalia   . HPV (human papilloma virus) infection   . PONV (postoperative nausea and vomiting)     Past Surgical History:  Procedure Laterality Date  . CESAREAN SECTION    . CESAREAN SECTION  03/02/2012   Procedure: CESAREAN SECTION;  Surgeon: Sharene Butters, MD;  Location: [facility];  Service: Obstetrics;  Laterality: N/A;  . CESAREAN SECTION N/A 10/22/2016   Procedure: CESAREAN SECTION;  Surgeon: Janyth Pupa, DO;  Location: [facility];  Service: Obstetrics;  Laterality: N/A;  . TONSILLECTOMY AND ADENOIDECTOMY    . TUBAL LIGATION Bilateral 10/22/2016   Procedure: POST PARTUM TUBAL LIGATION;  Surgeon: Janyth Pupa, DO;  Location: [facility];  Service: Obstetrics;  Laterality: Bilateral;    Family Psychiatric History: Father- suspected bipolar d/o, committed suicide when pt was 52 years old,  mother- depression and anxiety. 9 y/o son diagnosed with ADHD, 46 y/o- son - IED, ODD, suspected mood d/o- under the care of a child psychiatrist currently  Family History:  Family History  Problem Relation Age of Onset  . Cancer Maternal Grandmother     Social History:   Social History   Socioeconomic History  . Marital status: Married    Spouse name: Not on file  . Number of children: Not on file  . Years of education: Not on file  . Highest education level: Not on file  Occupational History  . Not on file  Tobacco Use  . Smoking status: Former Smoker    Packs/day: 0.50    Years: 10.00    Pack years: 5.00    Types: Cigarettes    Quit date: 09/13/2011    Years since quitting:  7.7  . Smokeless tobacco: Never Used  Substance and Sexual Activity  . Alcohol use: No  . Drug use: Yes    Types: Marijuana    Comment: Last use summer 2012  . Sexual activity: Yes    Birth control/protection: None    Comment: pregnant  Other Topics Concern  . Not on file  Social History Narrative  . Not on file   Social Determinants of Health   Financial Resource Strain:   . Difficulty of Paying Living Expenses:   Food Insecurity:   . Worried About Charity fundraiser in the Last Year:   . Arboriculturist in the Last Year:   Transportation Needs:   . Film/video editor (Medical):   Marland Kitchen Lack of Transportation (Non-Medical):   Physical Activity:   . Days of Exercise per Week:   .  Minutes of Exercise per Session:   Stress:   . Feeling of Stress :   Social Connections:   . Frequency of Communication with Friends and Family:   . Frequency of Social Gatherings with Friends and Family:   . Attends Religious Services:   . Active Member of Clubs or Organizations:   . Attends Archivist Meetings:   Marland Kitchen Marital Status:     Additional Social History: Lives with husband and 3 children- 78, 7, 2. Currently studying to for health and life insurance license exam. Has worked in different positions in the past  Allergies:   Allergies  Allergen Reactions  . Reglan [Metoclopramide] Shortness Of Breath    Metabolic Disorder Labs: No results found for: HGBA1C, MPG No results found for: PROLACTIN No results found for: CHOL, TRIG, HDL, CHOLHDL, VLDL, LDLCALC No results found for: TSH  Therapeutic Level Labs: No results found for: LITHIUM No results found for: CBMZ No results found for: VALPROATE  Current Medications: Current Outpatient Medications  Medication Sig Dispense Refill  . amoxicillin-clavulanate (AUGMENTIN) 875-125 MG tablet Take 1 tablet by mouth every 12 (twelve) hours. 14 tablet 0  . bacitracin ointment Apply 1 application topically 2 (two) times daily.  15 g 1  . docusate sodium (COLACE) 100 MG capsule Take 1 capsule (100 mg total) by mouth 2 (two) times daily. (Patient not taking: Reported on 01/04/2017) 30 capsule 0  . HYDROmorphone (DILAUDID) 2 MG tablet Take 1 tablet (2 mg total) by mouth every 6 (six) hours as needed for severe pain. (Patient not taking: Reported on 01/04/2017) 30 tablet 0  . ibuprofen (ADVIL,MOTRIN) 600 MG tablet Take 1 tablet (600 mg total) by mouth every 6 (six) hours. (Patient not taking: Reported on 01/04/2017) 30 tablet 0   No current facility-administered medications for this visit.    Musculoskeletal: Strength & Muscle Tone: unable to assess due to telemed visit Gait & Station: unable to assess due to telemed visit Patient leans: unable to assess due to telemed visit   Psychiatric Specialty Exam: Review of Systems  There were no vitals taken for this visit.There is no height or weight on file to calculate BMI.  General Appearance: Fairly Groomed  Eye Contact:  Good  Speech:  Clear and Coherent and Normal Rate  Volume:  Normal  Mood:  Anxious  Affect:  Congruent  Thought Process:  Goal Directed and Descriptions of Associations: Circumstantial  Orientation:  Full (Time, Place, and Person)  Thought Content:  Logical  Suicidal Thoughts:  No  Homicidal Thoughts:  No  Memory:  Immediate;   Good Recent;   Good  Judgement:  Fair  Insight:  Fair  Psychomotor Activity:  Normal  Concentration:  Concentration: Good and Attention Span: Fair  Recall:  Good  Fund of Knowledge:Good  Language: Good  Akathisia:  Negative  Handed:  Right  AIMS (if indicated):  not done  Assets:  Communication Skills Desire for Improvement Financial Resources/Insurance Housing Social Support  ADL's:  Intact  Cognition: WNL  Sleep:  Poor      Assessment and Plan: 34 year old female with history of anxiety, depression now seen for psychiatric evaluation.  Based on her psychiatric assessment patient meets her here for  bipolar 2 disorder due to the active hypomanic symptoms that include mood lability, distractibility, impulsivity, decreased need for sleep, increased energy levels.  Patient has tried and failed several different antidepressants in the past. Patient was offered mood stabilizer lamotrigine to help her with mood stabilization.  Potential side effects of medication and risks vs benefits of treatment vs non-treatment were explained and discussed. All questions were answered. Patient has been prescribed hydroxyzine as needed for anxiety and was recommended to take 25 mg twice daily as needed.  She was also agreeable to trial of trazodone to help her with sleep.  1. Bipolar 2 disorder (HCC) - lamoTRIgine (LAMICTAL) 25 MG tablet; Take 1 tablet daily for 2 weeks then take 2 tablets daily for 2 weeks then take 3 tablets daily for 2 weeks  Dispense: 90 tablet; Refill: 0 - traZODone (DESYREL) 50 MG tablet; Take 1 tablet (50 mg total) by mouth at bedtime as needed for sleep.  Dispense: 30 tablet; Refill: 1  2. Anxiety - Hydroxyzine 25 mg BID PRN   Follow-up in 6 weeks.    [place] Crane, MD 3/16/202111:09 AM

## 2019-06-08 ENCOUNTER — Telehealth: Payer: Self-pay

## 2019-06-08 NOTE — Telephone Encounter (Signed)
Patient called regarding her Lamotrigine 25mg . She is concerned about the side affects and she doesn't want to be dependent on the medication. Also, she stated that she has a hard time focusing but never depressed or sad. She was would like to get something for that. She also stated that she got tested for Bipolar as a teenager and tested negative.

## 2019-06-08 NOTE — Telephone Encounter (Signed)
What side effects has she experienced with Lamictal ? Based on her clinical assessment in March she met criteria for Bipolar 2 disorder. If she does not want to take lamictal then that is fine. Offer earlier appointment for follow up.  Thanks.

## 2019-06-09 NOTE — Telephone Encounter (Signed)
Called patient to find out what side affects she's referring to regarding the Lamotrigine 25mg  and to schedule an appointment with doctor. Patient did not pick up - LVM to call me back

## 2019-06-10 ENCOUNTER — Telehealth (HOSPITAL_COMMUNITY): Payer: Self-pay | Admitting: *Deleted

## 2019-06-10 NOTE — Telephone Encounter (Signed)
Writer called pt to find out what "side effects" she may be having from the Lamictal 25mg . Pt states that has never even picked up medication after she read s/e on the Internet, pt states she feels she's "more ADHD" and she was tested for Bi-Polar when she was a teenager and the "tests were negative". Now  wanting to speak with provider earlier. Writer informed pt she is scheduled for 06/15/19 but pt states she's unavailable that day and several others. Writer transferred pt to front desk to change appointment date.

## 2019-06-15 ENCOUNTER — Ambulatory Visit: Payer: Self-pay | Admitting: Psychiatry

## 2019-06-16 ENCOUNTER — Encounter: Payer: Self-pay | Admitting: Psychiatry

## 2019-06-16 ENCOUNTER — Other Ambulatory Visit: Payer: Self-pay

## 2019-06-16 ENCOUNTER — Ambulatory Visit (INDEPENDENT_AMBULATORY_CARE_PROVIDER_SITE_OTHER): Payer: No Typology Code available for payment source | Admitting: Psychiatry

## 2019-06-16 DIAGNOSIS — F3181 Bipolar II disorder: Secondary | ICD-10-CM | POA: Diagnosis not present

## 2019-06-16 DIAGNOSIS — F419 Anxiety disorder, unspecified: Secondary | ICD-10-CM

## 2019-06-16 NOTE — Progress Notes (Signed)
[place] MD OP Progress Note  I connected with  Lori Callahan on 06/16/19 by a video enabled telemedicine application and verified that I am speaking with the correct person using two identifiers.   I discussed the limitations of evaluation and management by telemedicine. The patient expressed understanding and agreed to proceed.    06/16/2019 11:32 AM Lori Callahan  MRN:  [id number]  Chief Complaint: " I am scared to take any medicine."  HPI: Patient stated that she has been very scared to take Lamictal.  She has not picked up her prescriptions yet.  She stated that she did her research online and she read some things that were really concerning.  She stated that she found out that this medicine is also prescribed for epilepsy and the website said that people can have seizures wants to stop the medication.  She stated that she is fearful of becoming dependent on the medication.  She is worried about how she is going react to the medication. She stated that her mother informed her that she was assessed for bipolar disorder as a teenager and at that time she did not meet the criteria.  Patient stated that she just feels irritated and overwhelmed because her brain is going 90 mph and she cannot stay on task. Writer reminded her all the complaints that she had mentioned at the time of her visit on March 16.  She was reminded that her symptoms were not just limited to being inattentive and that she was also having mood related issues.  She also had reported being significantly impulsive and making a lot of impulsive decisions. Patient agreed with all this and then stated that she is willing to give Lamictal a try.  Writer clarified that only if patients have epilepsy and the stop the medication is the scenario that they will have a seizure however in her case there is no history of epilepsy so she does not have to worry about having withdrawal seizures.  Writer clarified all the doubts the patient had and  she Agreed to try the medication.  Visit Diagnosis:    ICD-10-CM   1. Bipolar 2 disorder (HCC)  F31.81   2. Anxiety  F41.9     Past Psychiatric History: Anxiety, mood disorder  Past Medical History:  Past Medical History:  Diagnosis Date  . Anxiety    no meds  . GERD (gastroesophageal reflux disease)   . Herpes genitalia   . HPV (human papilloma virus) infection   . PONV (postoperative nausea and vomiting)     Past Surgical History:  Procedure Laterality Date  . CESAREAN SECTION    . CESAREAN SECTION  03/02/2012   Procedure: CESAREAN SECTION;  Surgeon: Sharene Butters, MD;  Location: [facility];  Service: Obstetrics;  Laterality: N/A;  . CESAREAN SECTION N/A 10/22/2016   Procedure: CESAREAN SECTION;  Surgeon: Janyth Pupa, DO;  Location: [facility];  Service: Obstetrics;  Laterality: N/A;  . TONSILLECTOMY AND ADENOIDECTOMY    . TUBAL LIGATION Bilateral 10/22/2016   Procedure: POST PARTUM TUBAL LIGATION;  Surgeon: Janyth Pupa, DO;  Location: [facility];  Service: Obstetrics;  Laterality: Bilateral;    Family Psychiatric History: Father- suspected bipolar d/o, committed suicide when pt was 62 years old,  mother- depression and anxiety. 69 y/o son diagnosed with ADHD, 50 y/o- son - IED, ODD, suspected mood d/o- under the care of a child psychiatrist currently  Family History:  Family History  Problem Relation Age of  Onset  . Cancer Maternal Grandmother     Social History:  Social History   Socioeconomic History  . Marital status: Married    Spouse name: Not on file  . Number of children: Not on file  . Years of education: Not on file  . Highest education level: Not on file  Occupational History  . Not on file  Tobacco Use  . Smoking status: Former Smoker    Packs/day: 0.50    Years: 10.00    Pack years: 5.00    Types: Cigarettes    Quit date: 09/13/2011    Years since quitting: 7.7  . Smokeless tobacco: Never Used  Substance and Sexual Activity  .  Alcohol use: No  . Drug use: Yes    Types: Marijuana    Comment: Last use summer 2012  . Sexual activity: Yes    Birth control/protection: None    Comment: pregnant  Other Topics Concern  . Not on file  Social History Narrative  . Not on file   Social Determinants of Health   Financial Resource Strain:   . Difficulty of Paying Living Expenses:   Food Insecurity:   . Worried About Charity fundraiser in the Last Year:   . Arboriculturist in the Last Year:   Transportation Needs:   . Film/video editor (Medical):   Marland Kitchen Lack of Transportation (Non-Medical):   Physical Activity:   . Days of Exercise per Week:   . Minutes of Exercise per Session:   Stress:   . Feeling of Stress :   Social Connections:   . Frequency of Communication with Friends and Family:   . Frequency of Social Gatherings with Friends and Family:   . Attends Religious Services:   . Active Member of Clubs or Organizations:   . Attends Archivist Meetings:   Marland Kitchen Marital Status:     Allergies:  Allergies  Allergen Reactions  . Reglan [Metoclopramide] Shortness Of Breath    Metabolic Disorder Labs: No results found for: HGBA1C, MPG No results found for: PROLACTIN No results found for: CHOL, TRIG, HDL, CHOLHDL, VLDL, LDLCALC No results found for: TSH  Therapeutic Level Labs: No results found for: LITHIUM No results found for: VALPROATE No components found for:  CBMZ  Current Medications: Current Outpatient Medications  Medication Sig Dispense Refill  . amoxicillin-clavulanate (AUGMENTIN) 875-125 MG tablet Take 1 tablet by mouth every 12 (twelve) hours. 14 tablet 0  . bacitracin ointment Apply 1 application topically 2 (two) times daily. 15 g 1  . docusate sodium (COLACE) 100 MG capsule Take 1 capsule (100 mg total) by mouth 2 (two) times daily. (Patient not taking: Reported on 01/04/2017) 30 capsule 0  . HYDROmorphone (DILAUDID) 2 MG tablet Take 1 tablet (2 mg total) by mouth every 6 (six)  hours as needed for severe pain. (Patient not taking: Reported on 01/04/2017) 30 tablet 0  . ibuprofen (ADVIL,MOTRIN) 600 MG tablet Take 1 tablet (600 mg total) by mouth every 6 (six) hours. (Patient not taking: Reported on 01/04/2017) 30 tablet 0  . lamoTRIgine (LAMICTAL) 25 MG tablet Take 1 tablet daily for 2 weeks then take 2 tablets daily for 2 weeks then take 3 tablets daily for 2 weeks 90 tablet 0  . traZODone (DESYREL) 50 MG tablet Take 1 tablet (50 mg total) by mouth at bedtime as needed for sleep. 30 tablet 1   No current facility-administered medications for this visit.    Musculoskeletal: Strength &  Muscle Tone: unable to assess due to telemed visit Gait & Station: unable to assess due to telemed visit Patient leans: unable to assess due to telemed visit   Psychiatric Specialty Exam: Review of Systems  There were no vitals taken for this visit.There is no height or weight on file to calculate BMI.  General Appearance: Well Groomed  Eye Contact:  Good  Speech:  Clear and Coherent and Normal Rate  Volume:  Normal  Mood:  Euthymic  Affect:  Congruent  Thought Process:  Goal Directed and Descriptions of Associations: Intact  Orientation:  Full (Time, Place, and Person)  Thought Content: Logical   Suicidal Thoughts:  No  Homicidal Thoughts:  No  Memory:  Immediate;   Good Recent;   Good  Judgement:  Fair  Insight:  Fair  Psychomotor Activity:  Normal  Concentration:  Concentration: Good and Attention Span: Good  Recall:  Good  Fund of Knowledge: Good  Language: Good  Akathisia:  Negative  Handed:  Right  AIMS (if indicated): not done  Assets:  Communication Skills Desire for Improvement Financial Resources/Insurance Housing Social Support  ADL's:  Intact  Cognition: WNL  Sleep:  Fair    Assessment and Plan: Patient expressed her hesitation about taking Lamictal after reading about it online.  All the concerns and questions were answered and addressed.  Patient  agreed to try Lamictal and follow-up in a month.  1. Bipolar 2 disorder (HCC) - lamoTRIgine (LAMICTAL) 25 MG tablet; Take 1 tablet daily for 2 weeks then take 2 tablets daily for 2 weeks then take 3 tablets daily for 2 weeks  Dispense: 90 tablet; Refill: 0 - traZODone (DESYREL) 50 MG tablet; Take 1 tablet (50 mg total) by mouth at bedtime as needed for sleep.  Dispense: 30 tablet; Refill: 1  2. Anxiety - Hydroxyzine 25 mg BID PRN   F/up in 1 month.  Nevada Crane, MD 06/16/2019, 11:32 AM

## 2019-06-18 ENCOUNTER — Ambulatory Visit (INDEPENDENT_AMBULATORY_CARE_PROVIDER_SITE_OTHER)
Admission: RE | Admit: 2019-06-18 | Discharge: 2019-06-18 | Disposition: A | Discharge status: Home / Self Care | Payer: Medicaid Other | Source: Ambulatory Visit

## 2019-06-18 DIAGNOSIS — D229 Melanocytic nevi, unspecified: Secondary | ICD-10-CM

## 2019-06-18 NOTE — Discharge Instructions (Signed)
Take Benadryl as needed for itching.  Apply topical over-the-counter antibiotic ointment as needed.  Do not scratch the area.    Contact your primary care provider for a referral to dermatology.

## 2019-06-18 NOTE — ED Provider Notes (Signed)
Virtual Visit via Video Note:  Lori Callahan  initiated request for Telemedicine visit with [facility] team. I connected with Lori Callahan  on 06/18/2019 at 8:39 AM  for a synchronized telemedicine visit using a video enabled HIPPA compliant telemedicine application. I verified that I am speaking with Lori Callahan  using two identifiers. Sharion Balloon, NP  was physically located in a [facility] Urgent care site and Lori Callahan was located at a different location.   The limitations of evaluation and management by telemedicine as well as the availability of in-person appointments were discussed. Patient was informed that she  may incur a bill ( including co-pay) for this virtual visit encounter. Lori Callahan  expressed understanding and gave verbal consent to proceed with virtual visit.     History of Present Illness:Lori Callahan  is a 34 y.o. female presents for evaluation of mole on her left foot which has been there "as long as I can remember".  She feels it is changing over the past year: it is itching, peeling, changing colors.  No drainage.  Patient requests a referral to dermatology.  Treatment attempted at home with OTC itch cream.  She denies fever, chills, redness, warmth, streaks, or other symptoms.     Allergies  Allergen Reactions  . Reglan [Metoclopramide] Shortness Of Breath     Past Medical History:  Diagnosis Date  . Anxiety    no meds  . GERD (gastroesophageal reflux disease)   . Herpes genitalia   . HPV (human papilloma virus) infection   . PONV (postoperative nausea and vomiting)      Social History   Tobacco Use  . Smoking status: Former Smoker    Packs/day: 0.50    Years: 10.00    Pack years: 5.00    Types: Cigarettes    Quit date: 09/13/2011    Years since quitting: 7.7  . Smokeless tobacco: Never Used  Substance Use Topics  . Alcohol use: No  . Drug use: Yes    Types: Marijuana    Comment: Last use summer 2012   ROS: as stated in  HPI.  All other systems reviewed and negative.      Observations/Objective: Physical Exam  VITALS: Patient denies fever. GENERAL: Alert, appears well and in no acute distress. HEENT: Atraumatic. NECK: Normal movements of the head and neck. CARDIOPULMONARY: No increased WOB. Speaking in clear sentences. I:E ratio WNL.  MS: Moves all visible extremities without noticeable abnormality. PSYCH: Pleasant and cooperative, well-groomed. Speech normal rate and rhythm. Affect is appropriate. Insight and judgement are appropriate. Attention is focused, linear, and appropriate.  NEURO: CN grossly intact. Oriented as arrived to appointment on time with no prompting. Moves both UE equally.  SKIN:  Approximate 4 mm circular nevus on top of left foot; appears multicolored black, brown, and pink; no drainage or erythema.     Assessment and Plan:    ICD-10-CM   1. Change in nevus  D22.9        Follow Up Instructions: Instructed patient to take Benadryl as needed for itching and to try not to scratch the area.  Instructed her to apply topical antibiotic ointment as needed.  Discussed that we do not make referrals here at the urgent care but that she will need to contact her PCP for referral to dermatology.  Patient agrees to plan of care.      I discussed the assessment and treatment plan with the  patient. The patient was provided an opportunity to ask questions and all were answered. The patient agreed with the plan and demonstrated an understanding of the instructions.   The patient was advised to call back or seek an in-person evaluation if the symptoms worsen or if the condition fails to improve as anticipated.      Sharion Balloon, NP  06/18/2019 8:39 AM         Sharion Balloon, NP 06/18/19 [phone]

## 2019-07-02 ENCOUNTER — Ambulatory Visit
Admission: EM | Admit: 2019-07-02 | Discharge: 2019-07-02 | Disposition: A | Discharge status: Home / Self Care | Payer: Medicaid Other | Attending: Emergency Medicine | Admitting: Emergency Medicine

## 2019-07-02 ENCOUNTER — Ambulatory Visit (INDEPENDENT_AMBULATORY_CARE_PROVIDER_SITE_OTHER)
Admission: RE | Admit: 2019-07-02 | Discharge: 2019-07-02 | Disposition: A | Discharge status: Home / Self Care | Payer: Medicaid Other | Source: Ambulatory Visit

## 2019-07-02 ENCOUNTER — Other Ambulatory Visit: Payer: Self-pay

## 2019-07-02 DIAGNOSIS — R05 Cough: Secondary | ICD-10-CM

## 2019-07-02 DIAGNOSIS — J069 Acute upper respiratory infection, unspecified: Secondary | ICD-10-CM

## 2019-07-02 DIAGNOSIS — R0981 Nasal congestion: Secondary | ICD-10-CM

## 2019-07-02 DIAGNOSIS — J302 Other seasonal allergic rhinitis: Secondary | ICD-10-CM

## 2019-07-02 MED ORDER — BENZONATATE 100 MG PO CAPS: 100.0000 mg | ORAL_CAPSULE | Freq: Three times a day (TID) | ORAL | 0 refills | Status: DC

## 2019-07-02 MED ORDER — GUAIFENESIN 400 MG PO TABS: 400.0000 mg | ORAL_TABLET | Freq: Four times a day (QID) | ORAL | 0 refills | Status: AC

## 2019-07-02 MED ORDER — FLUTICASONE PROPIONATE 50 MCG/ACT NA SUSP: 1.0000 | Freq: Every day | NASAL | 0 refills | Status: DC

## 2019-07-02 MED ORDER — CETIRIZINE HCL 10 MG PO TABS: 10.0000 mg | ORAL_TABLET | Freq: Every day | ORAL | 0 refills | Status: DC

## 2019-07-02 NOTE — Discharge Instructions (Addendum)

## 2019-07-02 NOTE — ED Provider Notes (Signed)
Virtual Visit via Video Note:  Lori Callahan  initiated request for Telemedicine visit with [facility] Urgent Care team. I connected with Lori Callahan  on 07/02/2019 at 1:04 PM  for a synchronized telemedicine visit using a video enabled HIPPA compliant telemedicine application. I verified that I am speaking with Lori Callahan  using two identifiers. Lori Hall-Potvin, PA-C  was physically located in a [facility] Urgent care site and Lori Callahan was located at a different location.   The limitations of evaluation and management by telemedicine as well as the availability of in-person appointments were discussed. Patient was informed that she  may incur a bill ( including co-pay) for this virtual visit encounter. Lori Callahan  expressed understanding and gave verbal consent to proceed with virtual visit.     History of Present Illness:Lori Callahan  is a 34 y.o. female presents with URI symptoms.  Endorsing sore throat Monday which is since resolved, nasal congestion, chest pain when coughing, cough.  States cough is productive without blood.  Breathing is at baseline.  No loss of smell or taste.  States her temperature has been elevated from her baseline of 90 7.74F (low 974F readings).  States her best friend is also sick: Last saw her a week ago.  Unsure if her friend has been tested for Covid.  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  No chest pain, nausea, vomiting, abdominal pain.   ROI as per HPI  Past Medical History:  Diagnosis Date  . Anxiety    no meds  . GERD (gastroesophageal reflux disease)   . Herpes genitalia   . HPV (human papilloma virus) infection   . PONV (postoperative nausea and vomiting)     Allergies  Allergen Reactions  . Reglan [Metoclopramide] Shortness Of Breath        Observations/Objective: 34 year old female Sitting in no acute distress.  Patient is able to speak in full sentences without coughing, sneezing,  wheezing.  Assessment and Plan: H&P consistent with seasonal allergies as well as URI: Likely viral.  Covid testing to be obtained later today in person.  Would consider antibiotics if Covid test is negative, and she has persistent or worsening symptoms >/= 10 days total duration.    Return precautions discussed, patient verbalized understanding and is agreeable to plan.  Follow Up Instructions: Patient to come to [facility] urgent care for Covid swab.    I discussed the assessment and treatment plan with the patient. The patient was provided an opportunity to ask questions and all were answered. The patient agreed with the plan and demonstrated an understanding of the instructions.   The patient was advised to call back or seek an in-person evaluation if the symptoms worsen or if the condition fails to improve as anticipated.  I provided 15 minutes of non-face-to-face time during this encounter.    [place], PA-C  07/02/2019 1:04 PM        Callahan, [place], PA-C 07/02/19 1304

## 2019-07-02 NOTE — ED Triage Notes (Signed)
Pt here for covid testing after a virtual visit

## 2019-07-03 LAB — SARS-COV-2, NAA 2 DAY TAT

## 2019-07-03 LAB — NOVEL CORONAVIRUS, NAA: SARS-CoV-2, NAA: NOT DETECTED

## 2019-07-05 ENCOUNTER — Telehealth: Payer: Self-pay

## 2019-07-05 NOTE — Telephone Encounter (Signed)
Okay. We can discuss this further at the time of her next appointment on May 5. I always tell my patients that they can always get a second opinion from another psychiatrist.

## 2019-07-05 NOTE — Telephone Encounter (Signed)
pt states that she is not [place] the lamictal stopped on wednesday of last week. angery , flipping out over small stuff, panic attack, crying. you told her that she was bipolar and she feel that it is wrong dx:  she thinks she is add. she can not focus.

## 2019-07-06 ENCOUNTER — Ambulatory Visit: Payer: 59 | Admitting: Psychiatry

## 2019-07-06 NOTE — Telephone Encounter (Signed)
Called and left message twice for pt to call office back

## 2019-07-14 ENCOUNTER — Other Ambulatory Visit: Payer: Self-pay

## 2019-07-14 ENCOUNTER — Telehealth: Payer: 59 | Admitting: Psychiatry

## 2020-01-14 ENCOUNTER — Telehealth (HOSPITAL_COMMUNITY): Payer: Self-pay | Admitting: *Deleted

## 2020-01-14 NOTE — Telephone Encounter (Signed)
Spoke with patient at the request of Toni Amend, management because patient was unhappy re her experience with Dr Toy Care on her recent virtual appt. She believed Dr Toy Care gave her the wrong dx and made it too quickly with too little information. Patient was seeking a dx of ADD, she doesn't believe she is bipolar as the dr suggested. She had already reported the dr to the medical board. When we spoke she was calm and pleasant. Writer asked what she wanted from the phone call or from [facility] at this time, and she said for now she didn't want anything, she wasn't willing to come back or try new medicine she felt the last time was too upsetting to her to try again now. She reported she had a very bad response to the medicine Dr Toy Care gave her and couldn't visit medicine again at this time.

## 2020-02-28 ENCOUNTER — Encounter (HOSPITAL_BASED_OUTPATIENT_CLINIC_OR_DEPARTMENT_OTHER): Payer: Self-pay

## 2020-02-28 ENCOUNTER — Other Ambulatory Visit: Payer: Self-pay

## 2020-02-28 ENCOUNTER — Emergency Department (HOSPITAL_BASED_OUTPATIENT_CLINIC_OR_DEPARTMENT_OTHER)
Admission: EM | Admit: 2020-02-28 | Discharge: 2020-02-28 | Disposition: A | Discharge status: Home / Self Care | Payer: 59 | Attending: Emergency Medicine | Admitting: Emergency Medicine

## 2020-02-28 DIAGNOSIS — Z046 Encounter for general psychiatric examination, requested by authority: Secondary | ICD-10-CM | POA: Diagnosis present

## 2020-02-28 DIAGNOSIS — F329 Major depressive disorder, single episode, unspecified: Secondary | ICD-10-CM | POA: Diagnosis not present

## 2020-02-28 DIAGNOSIS — F32A Depression, unspecified: Secondary | ICD-10-CM

## 2020-02-28 DIAGNOSIS — Z87891 Personal history of nicotine dependence: Secondary | ICD-10-CM | POA: Insufficient documentation

## 2020-02-28 LAB — CBC
HCT: 41 % (ref 36.0–46.0)
Hemoglobin: 13.9 g/dL (ref 12.0–15.0)
MCH: 30.9 pg (ref 26.0–34.0)
MCHC: 33.9 g/dL (ref 30.0–36.0)
MCV: 91.1 fL (ref 80.0–100.0)
Platelets: 250 10*3/uL (ref 150–400)
RBC: 4.5 MIL/uL (ref 3.87–5.11)
RDW: 12.3 % (ref 11.5–15.5)
WBC: 6.1 10*3/uL (ref 4.0–10.5)
nRBC: 0 % (ref 0.0–0.2)

## 2020-02-28 LAB — COMPREHENSIVE METABOLIC PANEL
ALT: 42 U/L (ref 0–44)
AST: 27 U/L (ref 15–41)
Albumin: 4.4 g/dL (ref 3.5–5.0)
Alkaline Phosphatase: 63 U/L (ref 38–126)
Anion gap: 9 (ref 5–15)
BUN: 7 mg/dL (ref 6–20)
CO2: 25 mmol/L (ref 22–32)
Calcium: 9.3 mg/dL (ref 8.9–10.3)
Chloride: 104 mmol/L (ref 98–111)
Creatinine, Ser: 0.62 mg/dL (ref 0.44–1.00)
GFR, Estimated: >60 mL/min (ref >60)
Glucose, Bld: 95 mg/dL (ref 70–99)
Potassium: 4.1 mmol/L (ref 3.5–5.1)
Sodium: 138 mmol/L (ref 135–145)
Total Bilirubin: 0.6 mg/dL (ref 0.3–1.2)
Total Protein: 8.2 g/dL — ABNORMAL HIGH (ref 6.5–8.1)

## 2020-02-28 LAB — RAPID URINE DRUG SCREEN, HOSP PERFORMED
Amphetamines: NOT DETECTED
Barbiturates: NOT DETECTED
Benzodiazepines: NOT DETECTED
Cocaine: NOT DETECTED
Opiates: NOT DETECTED
Tetrahydrocannabinol: POSITIVE — AB

## 2020-02-28 LAB — ETHANOL: Alcohol, Ethyl (B): <10 mg/dL (ref <10)

## 2020-02-28 LAB — SALICYLATE LEVEL: Salicylate Lvl: <7 mg/dL — ABNORMAL LOW (ref 7.0–30.0)

## 2020-02-28 LAB — PREGNANCY, URINE: Preg Test, Ur: NEGATIVE

## 2020-02-28 LAB — ACETAMINOPHEN LEVEL: Acetaminophen (Tylenol), Serum: <10 ug/mL — ABNORMAL LOW (ref 10–30)

## 2020-02-28 NOTE — ED Provider Notes (Addendum)
[facility] EMERGENCY DEPARTMENT Provider Note   CSN: [id number] Arrival date & time: 02/28/20  1255     History Chief Complaint  Patient presents with   Psychiatric Evaluation    Anxiety, depression    Lori Callahan is a 34 y.o. female.  The history is provided by the patient. No language interpreter was used.  Depression This is a new problem. The current episode started more than 2 days ago. The problem occurs constantly. The problem has been gradually worsening. Nothing aggravates the symptoms. Nothing relieves the symptoms. She has tried nothing for the symptoms. The treatment provided no relief.  Pt reports she has been depressed and anxious.  Pt recently had a uri.  Pt reports she called behavioral health and was told to come to the ED.  Pt has been taking zithromax and tessalon for cough.  Pt reports she was previously diagnosed with Bipolar disorder and was started on lamictal.  Pt reports lamictal made her suicidal and she stopped taking      Past Medical History:  Diagnosis Date   Anxiety    no meds   GERD (gastroesophageal reflux disease)    Herpes genitalia    HPV (human papilloma virus) infection    PONV (postoperative nausea and vomiting)     Patient Active Problem List   Diagnosis Date Noted   Bipolar 2 disorder ([place]) 05/25/2019   Anxiety 05/25/2019   Normal intrauterine pregnancy in third trimester 10/22/2016   [redacted] weeks gestation of pregnancy    Abnormal findings on antenatal screening     Past Surgical History:  Procedure Laterality Date   CESAREAN SECTION     CESAREAN SECTION  03/02/2012   Procedure: CESAREAN SECTION;  Surgeon: Sharene Butters, MD;  Location: [facility];  Service: Obstetrics;  Laterality: N/A;   CESAREAN SECTION N/A 10/22/2016   Procedure: CESAREAN SECTION;  Surgeon: Janyth Pupa, DO;  Location: [place];  Service: Obstetrics;  Laterality: N/A;   TONSILLECTOMY AND ADENOIDECTOMY     TUBAL  LIGATION Bilateral 10/22/2016   Procedure: POST PARTUM TUBAL LIGATION;  Surgeon: Janyth Pupa, DO;  Location: [place];  Service: Obstetrics;  Laterality: Bilateral;     OB History    Gravida  3   Para  3   Term  3   Preterm      AB      Living  3     SAB      IAB      Ectopic      Multiple  0   Live Births  3           Family History  Problem Relation Age of Onset   Cancer Maternal Grandmother     Social History   Tobacco Use   Smoking status: Former Smoker    Packs/day: 0.50    Years: 10.00    Pack years: 5.00    Types: Cigarettes    Quit date: 09/13/2011    Years since quitting: 8.4   Smokeless tobacco: Never Used  Substance Use Topics   Alcohol use: Yes    Comment: socially   Drug use: Yes    Types: Marijuana    Home Medications Prior to Admission medications   Medication Sig Start Date End Date Taking? Authorizing Provider  benzonatate (TESSALON) 100 MG capsule Take 1 capsule (100 mg total) by mouth every 8 (eight) hours. 07/02/19   Hall-Potvin, [place], PA-C  cetirizine (ZYRTEC ALLERGY) 10 MG tablet  Take 1 tablet (10 mg total) by mouth daily. 07/02/19   Hall-Potvin, [place], PA-C  fluticasone (FLONASE) 50 MCG/ACT nasal spray Place 1 spray into both nostrils daily. 07/02/19   Hall-Potvin, [place], PA-C  lamoTRIgine (LAMICTAL) 25 MG tablet Take 1 tablet daily for 2 weeks then take 2 tablets daily for 2 weeks then take 3 tablets daily for 2 weeks 05/25/19   [place] Crane, MD  traZODone (DESYREL) 50 MG tablet Take 1 tablet (50 mg total) by mouth at bedtime as needed for sleep. 05/25/19 07/02/19  [place] Crane, MD    Allergies    Reglan [metoclopramide]  Review of Systems   Review of Systems  Psychiatric/Behavioral: Positive for depression.  All other systems reviewed and are negative.   Physical Exam Updated Vital Signs BP 118/90 (BP Location: Right Arm)    Pulse 94    Temp 98.2 F (36.8 C) (Oral)    Resp 18    Ht 5\' 5"  (1.651  m)    Wt 90.7 kg    LMP 02/21/2020    SpO2 100%    BMI 33.28 kg/m   Physical Exam Vitals and nursing note reviewed.  Constitutional:      Appearance: She is well-developed and well-nourished.  HENT:     Head: Normocephalic.  Eyes:     Extraocular Movements: EOM normal.  Cardiovascular:     Rate and Rhythm: Normal rate and regular rhythm.     Pulses: Normal pulses.  Pulmonary:     Effort: Pulmonary effort is normal.  Abdominal:     General: There is no distension.  Musculoskeletal:        General: Normal range of motion.     Cervical back: Normal range of motion.  Skin:    General: Skin is warm.  Neurological:     General: No focal deficit present.     Mental Status: She is alert and oriented to person, place, and time.  Psychiatric:        Mood and Affect: Mood and affect and mood normal.     ED Results / Procedures / Treatments   Labs (all labs ordered are listed, but only abnormal results are displayed) Labs Reviewed  COMPREHENSIVE METABOLIC PANEL - Abnormal; Notable for the following components:      Result Value   Total Protein 8.2 (*)    All other components within normal limits  SALICYLATE LEVEL - Abnormal; Notable for the following components:   Salicylate Lvl <8.8 (*)    All other components within normal limits  ACETAMINOPHEN LEVEL - Abnormal; Notable for the following components:   Acetaminophen (Tylenol), Serum <10 (*)    All other components within normal limits  RAPID URINE DRUG SCREEN, HOSP PERFORMED - Abnormal; Notable for the following components:   Tetrahydrocannabinol POSITIVE (*)    All other components within normal limits  ETHANOL  CBC  PREGNANCY, URINE    EKG None  Radiology No results found.  Procedures Procedures (including critical care time)  Medications Ordered in ED Medications - No data to display  ED Course  I have reviewed the triage vital signs and the nursing notes.  Pertinent labs & imaging results that were  available during my care of the patient were reviewed by me and considered in my medical decision making (see chart for details).    MDM Rules/Calculators/A&P  MDM: TTS consult.  TTS advised outpatient treatment  Final Clinical Impression(s) / ED Diagnoses Final diagnoses:  Depression, unspecified depression type    Rx / DC Orders ED Discharge Orders    None    An After Visit Summary was printed and given to the patient.    Fransico Meadow, PA-C 02/28/20 1920    Fransico Meadow, PA-C 02/28/20 1920    Lucrezia Starch, MD 03/02/20 1447

## 2020-02-28 NOTE — ED Notes (Signed)
TTS placed in room.

## 2020-02-28 NOTE — BH Assessment (Signed)
Comprehensive Clinical Assessment (CCA) Note  02/28/2020 Lori Callahan [id number]   Patient is a 34 year old female presenting voluntarily to [facility] ED for assessment of anxiety, depression, and a panic attack this morning. Patient reports increased depression for the past month. This morning patient reports she felt light-headed, increased heart rate, blurred vision, and shortness of breath, and chest pain, which she attributes to a panic attack. Patient reports panic attacks on about monthly basis. She denies SI/HI/AVH. Patient reports she was diagnosed with Bipolar II in April of last year and was prescribed Lamictal. She states this medication caused increased SI so she discontinued. She does not have a current outpatient provider. Her PCP prescribed Xanax .25 mg PRN for panic but she is currently between physicians and is unable to get this filled. Patient endorses using THC on a nightly basis for the past 10 years. She states she does not feel she needs to be hospitalized but is interested in [facility] program.   Per Harriett Sine, PMHNP patient does not meet in patient care criteria and is psych cleared for d/c. Outpatient resources provided.  Chief Complaint:  Chief Complaint  Patient presents with   Psychiatric Evaluation    Anxiety, depression   Visit Diagnosis: F41.0 Panic Disorder    F31.81 Bipolar II     CCA Biopsychosocial Intake/Chief Complaint:  NA  Current Symptoms/Problems: NA   Patient Reported Schizophrenia/Schizoaffective Diagnosis in Past: No   Strengths: NA  Preferences: NA  Abilities: NA   Type of Services Patient Feels are Needed: NA   Initial Clinical Notes/Concerns: NA   Mental Health Symptoms Depression:  Change in energy/activity; Difficulty Concentrating; Fatigue; Hopelessness; Irritability; Tearfulness   Duration of Depressive symptoms: Greater than two weeks   Mania:  Recklessness   Anxiety:   Worrying; Tension; Fatigue; Irritability;  Difficulty concentrating   Psychosis:  None   Duration of Psychotic symptoms: No data recorded  Trauma:  None   Obsessions:  None   Compulsions:  None   Inattention:  None   Hyperactivity/Impulsivity:  N/A   Oppositional/Defiant Behaviors:  N/A   Emotional Irregularity:  N/A   Other Mood/Personality Symptoms:  No data recorded   Mental Status Exam Appearance and self-care  Stature:  Average   Weight:  Average weight   Clothing:  Neat/clean   Grooming:  Normal   Cosmetic use:  None   Posture/gait:  Normal   Motor activity:  Not Remarkable   Sensorium  Attention:  Normal   Concentration:  Normal   Orientation:  X5   Recall/memory:  Normal   Affect and Mood  Affect:  Anxious; Depressed   Mood:  Anxious; Depressed   Relating  Eye contact:  Normal   Facial expression:  Anxious   Attitude toward examiner:  Cooperative   Thought and Language  Speech flow: Clear and Coherent   Thought content:  Appropriate to Mood and Circumstances   Preoccupation:  None   Hallucinations:  None   Organization:  No data recorded  [employer] of Knowledge:  Good   Intelligence:  Average   Abstraction:  Normal   Judgement:  Good   Reality Testing:  Realistic   Insight:  Fair   Decision Making:  Normal   Social Functioning  Social Maturity:  Isolates   Social Judgement:  Normal   Stress  Stressors:  Work; Illness   Coping Ability:  Deficient supports   Skill Deficits:  Responsibility   Supports:  Family; Friends/Service  system     Religion: Religion/Spirituality Are You A Religious Person?: No  Leisure/Recreation: Leisure / Recreation Do You Have Hobbies?: No  Exercise/Diet: Exercise/Diet Do You Exercise?: No Have You Gained or Lost A Significant Amount of Weight in the Past Six Months?: No Do You Follow a Special Diet?: No Do You Have Any Trouble Sleeping?: No   CCA Employment/Education Employment/Work  Situation: Employment / Work Situation Employment situation: Employed 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 How long has patient been employed?: 9 months Patient's job has been impacted by current illness: Yes Describe how patient's job has been impacted: states it is causing her anxiety What is the longest time patient has a held a job?: [employer] Where was the patient employed at that time?: [employer] Has patient ever been in the [employer]?: No  Education: Education Is Patient Currently Attending School?: No Last Grade Completed: 12 Did Teacher, adult education From [employer]?: No Did You Nutritional therapist?: No Did Heritage manager?: No Did You Have An Individualized Education Program (IIEP): No Did You Have Any Difficulty At [employer]?: No Patient's Education Has Been Impacted by Current Illness: No   CCA Family/Childhood History Family and Relationship History: Family history Marital status: Married Number of Years Married:  Special educational needs teacher) What types of issues is patient dealing with in the relationship?: none Additional relationship information: NA Are you sexually active?: Yes What is your sexual orientation?: heterosexual Does patient have children?: Yes How many children?: 3 How is patient's relationship with their children?: ages 22, 65, and 20 -close to all  Childhood History:  Childhood History By whom was/is the patient raised?: Mother Additional childhood history information: father commited suicide when she was a child Description of patient's relationship with caregiver when they were a child: close and supportive Patient's description of current relationship with people who raised him/her: close and supportive Does patient have siblings?: No Did patient suffer any verbal/emotional/physical/sexual abuse as a child?: No Did patient suffer from severe childhood neglect?: No Has patient ever been sexually abused/assaulted/raped as an adolescent or adult?: No Was the patient ever  a victim of a crime or a disaster?: No Witnessed domestic violence?: No Has patient been affected by domestic violence as an adult?: No  Child/Adolescent Assessment:     CCA Substance Use Alcohol/Drug Use: Alcohol / Drug Use Pain Medications: see MAR Prescriptions: see MAR Over the Counter: see MAR History of alcohol / drug use?: Yes Substance #1 Name of Substance 1: THC 1 - Age of First Use: teens 1 - Amount (size/oz): "a little bit" 1 - Frequency: nightly 1 - Duration: 10 years 1 - Last Use / Amount: 12/19                       ASAM's:  Six Dimensions of Multidimensional Assessment  Dimension 1:  Acute Intoxication and/or Withdrawal Potential:      Dimension 2:  Biomedical Conditions and Complications:      Dimension 3:  Emotional, Behavioral, or Cognitive Conditions and Complications:     Dimension 4:  Readiness to Change:     Dimension 5:  Relapse, Continued use, or Continued Problem Potential:     Dimension 6:  Recovery/Living Environment:     ASAM Severity Score:    ASAM Recommended Level of Treatment: ASAM Recommended Level of Treatment: Level I Outpatient Treatment   Substance use Disorder (SUD) Substance Use Disorder (SUD)  Checklist Symptoms of Substance Use: Evidence of tolerance,Presence of craving or strong  urge to use,Substance(s) often taken in larger amounts or over longer times than was intended  Recommendations for Services/Supports/Treatments: Recommendations for Services/Supports/Treatments Recommendations For Services/Supports/Treatments: Individual Therapy  DSM5 Diagnoses: Patient Active Problem List   Diagnosis Date Noted   Bipolar 2 disorder ([place]) 05/25/2019   Anxiety 05/25/2019   Normal intrauterine pregnancy in third trimester 10/22/2016   [redacted] weeks gestation of pregnancy    Abnormal findings on antenatal screening     Patient Centered Plan: Patient is on the following Treatment Plan(s):  Referrals to Alternative  Service(s): Referred to Alternative Service(s):   Place:   Date:   Time:    Referred to Alternative Service(s):   Place:   Date:   Time:    Referred to Alternative Service(s):   Place:   Date:   Time:    Referred to Alternative Service(s):   Place:   Date:   Time:     Orvis Brill, LCSW

## 2020-02-28 NOTE — BHH Counselor (Signed)
  Per Harriett Sine, PMHNP patient does not meet in patient care criteria and is psych cleared for d/c. Outpatient resources provided. Vivien Rota, RN notified

## 2020-02-28 NOTE — Discharge Instructions (Signed)
Follow up your as directed

## 2020-02-28 NOTE — ED Triage Notes (Addendum)
Pt arrives complaining of feeling anxious and more depressed than normal for the past few days. Recently diagnosed with Bipolar in February. States she couldn't get herself to calm down at work today, she felt dizzy and tingling and anxious. Denies SI/HI. Recently diagnosed with URI and was given abx and tessalon.

## 2020-02-29 ENCOUNTER — Other Ambulatory Visit: Payer: Self-pay

## 2020-02-29 ENCOUNTER — Encounter (HOSPITAL_COMMUNITY): Payer: Self-pay | Admitting: Emergency Medicine

## 2020-02-29 ENCOUNTER — Ambulatory Visit (HOSPITAL_COMMUNITY)
Admission: EM | Admit: 2020-02-29 | Discharge: 2020-02-29 | Disposition: A | Discharge status: Home / Self Care | Payer: 59 | Attending: Registered Nurse | Admitting: Registered Nurse

## 2020-02-29 ENCOUNTER — Telehealth (HOSPITAL_COMMUNITY): Payer: Self-pay | Admitting: Licensed Clinical Social Worker

## 2020-02-29 DIAGNOSIS — F3181 Bipolar II disorder: Secondary | ICD-10-CM

## 2020-02-29 DIAGNOSIS — F419 Anxiety disorder, unspecified: Secondary | ICD-10-CM

## 2020-02-29 DIAGNOSIS — Z3493 Encounter for supervision of normal pregnancy, unspecified, third trimester: Secondary | ICD-10-CM

## 2020-02-29 HISTORY — DX: Bipolar disorder, unspecified: F31.9

## 2020-02-29 HISTORY — DX: Attention-deficit hyperactivity disorder, unspecified type: F90.9

## 2020-02-29 MED ORDER — QUETIAPINE FUMARATE 25 MG PO TABS: ORAL_TABLET | ORAL | 0 refills | Status: DC
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
## 2020-02-29 NOTE — ED Notes (Signed)
Patient A&O x 4, ambulatory. Patient discharged in no acute distress. Patient denied SI/HI, A/VH upon discharge. Patient verbalized understanding of all discharge instructions explained by staff, to include follow up appointments, resources and safety plan. Patient escorted to lobby via staff for transport to destination. Safety maintained.

## 2020-02-29 NOTE — ED Provider Notes (Signed)
Behavioral Health Urgent Care Medical Screening Exam  Patient Name: Lori Callahan MRN: [id number] Date of Evaluation: 02/29/20 Chief Complaint:   Diagnosis:  Final diagnoses:  Bipolar 2 disorder ([place])  Anxiety    History of Present illness: Lori Callahan is a 34 y.o. female patient presented to [place] as walk in with complaints of worsening anxiety, panic attack, and unable to return to work and the need for medication prior to her PHP appointment on 03/13/20.    Lori Callahan, 34 y.o., female patient seen face to face  by this provider, consulted with Dr. Serafina Mitchell; and chart reviewed on 02/29/20.  On evaluation Lori Callahan reports she was seen in the emergency room yesterday and referred for [facility] which she is suppose to start 03/13/2020 but doesn't feel she can wait that long until she is started on medication.  States she was diagnosed with Bipolar 2 disorder by Dr. [place] Crane and started on Lamictal.  States she took for 2 weeks but it made her feel "crazy.  I won't myself.  My suicidal thoughts got worse, I was angry.  I just didn't feel right. So I stopped taking it."  Patient states she tried to go to work today and had a panic attack "I just can't function at work."  Patient states she feels she needs medication prior to starting the [facility] program because she just can't function.  Discussed Seroquel that she can take at night that will help with sleep and a lower dose during the morning that will help with anxiety and mood.  Understanding voiced.    During evaluation Lori Callahan is alert/oriented x 4; calm/cooperative; and mood is congruent with affect.  She does not appear to be responding to internal/external stimuli or delusional thoughts.  Patient denies suicidal/self-harm/homicidal ideation, psychosis, and paranoia.  Patient answered question appropriately. Patient will return to [facility] or ED if adverse reaction to Seroquel.  Efficacy and adverse reaction along discussed and written  information also given on Seroquel.  Understanding voiced.       Psychiatric Specialty Exam  Presentation  General Appearance:Appropriate for Environment; Casual  Eye Contact:Good  Speech:Clear and Coherent; Normal Rate  Speech Volume:Normal  Handedness:Right   Mood and Affect  Mood:Anxious; Depressed  Affect:Appropriate; Congruent   Thought Process  Thought Processes:Coherent; Goal Directed  Descriptions of Associations:Intact  Orientation:Full (Time, Place and Person)  Thought Content:WDL  Hallucinations:None  Ideas of Reference:None  Suicidal Thoughts:No  Homicidal Thoughts:No   Sensorium  Memory:Immediate Good; Recent Good; Remote Good  Judgment:Intact  Insight:Present   Executive Functions  Concentration:Good  Attention Span:Good  [place]  Language:Good   Psychomotor Activity  Psychomotor Activity:Normal   Assets  Assets:Communication Skills; Desire for Improvement; Financial Resources/Insurance; Housing; Social Support   Sleep  Sleep:Fair  Number of hours: No data recorded  Physical Exam: Physical Exam Vitals and nursing note reviewed.  Constitutional:      General: She is not in acute distress.    Appearance: She is well-developed and well-nourished.  HENT:     Head: Normocephalic and atraumatic.  Eyes:     Conjunctiva/sclera: Conjunctivae normal.  Cardiovascular:     Rate and Rhythm: Normal rate and regular rhythm.     Heart sounds: No murmur heard.   Pulmonary:     Effort: Pulmonary effort is normal. No respiratory distress.     Breath sounds: Normal breath sounds.  Abdominal:     Palpations: Abdomen is soft.  Tenderness: There is no abdominal tenderness.  Musculoskeletal:        General: No edema. Normal range of motion.     Cervical back: Neck supple.  Skin:    General: Skin is warm and dry.  Neurological:     Mental Status: She is alert and oriented to person, place, and  time.  Psychiatric:        Attention and Perception: Attention and perception normal. She does not perceive auditory or visual hallucinations.        Mood and Affect: Affect normal. Mood is anxious and depressed.        Speech: Speech normal.        Behavior: Behavior normal. Behavior is cooperative.        Thought Content: Thought content normal. Thought content is not paranoid or delusional. Thought content does not include homicidal or suicidal ideation.        Cognition and Memory: Cognition normal.        Judgment: Judgment normal.    Review of Systems  Constitutional: Negative.   HENT: Negative.   Eyes: Negative.   Respiratory: Negative.   Cardiovascular: Negative.   Gastrointestinal: Negative.   Genitourinary: Negative.   Musculoskeletal: Negative.   Skin: Negative.   Neurological: Negative.   Endo/Heme/Allergies: Negative.   Psychiatric/Behavioral: Positive for depression. Negative for hallucinations, substance abuse and suicidal ideas. The patient is nervous/anxious and has insomnia.    Blood pressure 124/87, pulse 71, temperature 97.7 F (36.5 C), temperature source Tympanic, resp. rate 18, last menstrual period 02/21/2020, SpO2 100 %. There is no height or weight on file to calculate BMI.  Musculoskeletal: Strength & Muscle Tone: within normal limits Gait & Station: normal Patient leans: N/A   [place] MSE Discharge Disposition for Follow up and Recommendations: Based on my evaluation the patient does not appear to have an emergency medical condition and can be discharged with resources and follow up care in outpatient services for Medication Management, Partial Hospitalization Program and Group Therapy    [facility] On 03/13/2020.   Specialty: Behavioral Health Why: 2:00 PM.  Keep scheduled appointment for [facility] program Contact information: [address]  [place] [phone]              Earleen Newport, NP 02/29/2020, 4:46 PM

## 2020-02-29 NOTE — BH Assessment (Signed)
Comprehensive Clinical Assessment (CCA) Note  02/29/2020 Lori Callahan [id number]  Visit Diagnosis:  F41.0 Panic Disorder; F31.81 Bipolar II Disposition: Shuvon Rankin, NP recommends discharge from [facility]. Pt has 03/13/20 appointment with [facility]  Lori Callahan is a 34 yo married female who presents voluntarily to [facility]. Pt was unaccompanied and reporting symptoms of increasing anxiety. Pt denies current suicidal ideation. Pt acknowledges multiple symptoms of Depression, including anhedonia, isolating, feelings of worthlessness & guilt, tearfulness & increased irritability. Pt denies homicidal ideation/ history of violence. Pt denies auditory & visual hallucinations & other symptoms of psychosis. Pt states current stressors include recent negative reaction to Lamictal and increased sx of anxiety. Pt is set to start [facility] 03/13/20. She states she can't tolerate current level of anxiety until that appointment & is requesting medication.    MSE: Pt is casually dressed, alert, oriented x 5  with normal speech and normal motor behavior. Eye contact is good. Pt's mood is depressed and anxious and affect is congruent with mood. Thought process is coherent and relevant. There is no indication pt is currently responding to internal stimuli or experiencing delusional thought content. Pt was cooperative throughout assessment.    02/10/20 Note from Orvis Brill, TTS Counselor:  Patient is a 34 year old female presenting voluntarily to [facility] ED for assessment of anxiety, depression, and a panic attack this morning. Patient reports increased depression for the past month. This morning patient reports she felt light-headed, increased heart rate, blurred vision, and shortness of breath, and chest pain, which she attributes to a panic attack. Patient reports panic attacks on about monthly basis. She denies SI/HI/AVH. Patient reports she was diagnosed with Bipolar II in April of last year and was prescribed Lamictal.  She states this medication caused increased SI so she discontinued. She does not have a current outpatient provider. Her PCP prescribed Xanax .25 mg PRN for panic but she is currently between physicians and is unable to get this filled. Patient endorses using THC on a nightly basis for the past 10 years. She states she does not feel she needs to be hospitalized but is interested in [facility] program.     Chief Complaint:  Chief Complaint  Patient presents with  . Depression    Pt complained of depression that is characterized by panic attacks and dizziness. Pt stated that she cannot calm down or sleep and have "crawl in a hole" feeling. Pt is recently diagnosed with Bipolar 2. Pt denies SI and HI.  Marland Kitchen Anxiety      CCA Screening, Triage and Referral (STR)  Patient Reported Information How did you hear about [place]? Other (Comment) (Phreesia 02/29/2020)  Referral name: [place] ([place] 02/29/2020)  Referral phone number: No data recorded  Whom do you see for routine medical problems? I don't have a doctor ([place] 02/29/2020)  Practice/Facility Name: No data recorded Practice/Facility Phone Number: No data recorded Name of Contact: No data recorded Contact Number: No data recorded Contact Fax Number: No data recorded Prescriber Name: No data recorded Prescriber Address (if known): No data recorded  What Is the Reason for Your Visit/Call Today? Bipolar Depressive Episode (Phreesia 02/29/2020)  How Long Has This Been Causing You Problems? > than 6 months (Phreesia 02/29/2020)  What Do You Feel Would Help You the Most Today? Medication (Phreesia 02/29/2020)   Have You Recently Been in Any Inpatient Treatment (Hospital/Detox/Crisis Center/28-Day Program)? Yes (Phreesia 02/29/2020)  Name/Location of Program/Hospital:[facility] ([place] 02/29/2020)  How  Long Were You There? 5 Days (Phreesia 02/29/2020)  When Were You Discharged? No data  recorded  Have You Ever Received Services From [facility] Before? Yes (Phreesia 02/29/2020)  Who Do You See at [facility]? Na (Phreesia 02/29/2020)   Have You Recently Had Any Thoughts About Hurting Yourself? No (Phreesia 02/29/2020)  Are You Planning to Commit Suicide/Harm Yourself At This time? No (Phreesia 02/29/2020)   Have you Recently Had Thoughts About [place]? No (Phreesia 02/29/2020)  Explanation: No data recorded  Have You Used Any Alcohol or Drugs in the Past 24 Hours? No (Phreesia 02/29/2020)  How Long Ago Did You Use Drugs or Alcohol? No data recorded What Did You Use and How Much? No data recorded  Do You Currently Have a Therapist/Psychiatrist? No (Phreesia 02/29/2020)  Name of Therapist/Psychiatrist: No data recorded  Have You Been Recently Discharged From Any Office Practice or Programs? Yes (Phreesia 02/29/2020)  Explanation of Discharge From Practice/Program: [place] (Phreesia 02/29/2020)     CCA Screening Triage Referral Assessment Type of Contact: Face-to-Face  Patient Reported Information Reviewed? Yes    Patient Determined To Be At Risk for Harm To Self or Others Based on Review of Patient Reported Information or Presenting Complaint? No   Location of Assessment: [facility]   Does Patient Present under Involuntary Commitment? No  IVC Papers Initial File Date: No data recorded  [place] of Residence: [place]   Patient Currently Receiving the Following Services: Medication Management   Determination of Need: Routine (7 days)   Options For Referral: Medication Management; Outpatient Therapy; Partial Hospitalization     CCA Biopsychosocial Intake/Chief Complaint:  reporting increased anxiety with panic attacks and dizziness. negative reaction to lamictal; wants a different medication  Current Symptoms/Problems: anxiety   Patient Reported Schizophrenia/Schizoaffective Diagnosis in Past:  No   Strengths: established services  Preferences: NA  Abilities: NA   Type of Services Patient Feels are Needed: med mngt   Initial Clinical Notes/Concerns: NA   Mental Health Symptoms Depression:  Change in energy/activity; Difficulty Concentrating; Fatigue; Hopelessness; Irritability; Tearfulness; Worthlessness   Duration of Depressive symptoms: Greater than two weeks   Mania:  Irritability   Anxiety:   Worrying; Tension; Fatigue; Irritability; Difficulty concentrating   Psychosis:  None   Duration of Psychotic symptoms: No data recorded  Trauma:  None   Obsessions:  None   Compulsions:  None   Inattention:  None   Hyperactivity/Impulsivity:  N/A   Oppositional/Defiant Behaviors:  N/A   Emotional Irregularity:  N/A   Other Mood/Personality Symptoms:  No data recorded   Mental Status Exam Appearance and self-care  Stature:  Average   Weight:  Average weight   Clothing:  Neat/clean   Grooming:  Normal   Cosmetic use:  None   Posture/gait:  Normal   Motor activity:  Not Remarkable   Sensorium  Attention:  Normal   Concentration:  Normal   Orientation:  X5   Recall/memory:  Normal   Affect and Mood  Affect:  Anxious; Depressed   Mood:  Anxious; Depressed   Relating  Eye contact:  Normal   Facial expression:  Anxious   Attitude toward examiner:  Cooperative   Thought and Language  Speech flow: Clear and Coherent   Thought content:  Appropriate to Mood and Circumstances   Preoccupation:  None   Hallucinations:  None   Organization:  No data recorded  [employer] of Knowledge:  Good   Intelligence:  Average   Abstraction:  Normal   Judgement:  Good   Reality Testing:  Realistic   Insight:  Fair   Decision Making:  Normal   Social Functioning  Social Maturity:  Isolates   Social Judgement:  Normal   Stress  Stressors:  Work; Illness   Coping Ability:  Deficient supports   Skill Deficits:   Responsibility   Supports:  Family; Friends/Service system     Religion: Religion/Spirituality Are You A Religious Person?: No  Leisure/Recreation: Leisure / Recreation Do You Have Hobbies?: No  Exercise/Diet: Exercise/Diet Do You Exercise?: No Have You Gained or Lost A Significant Amount of Weight in the Past Six Months?: No Do You Follow a Special Diet?: No Do You Have Any Trouble Sleeping?: No (some trouble falling asleep & then stays asleep 8 hours)   CCA Employment/Education Employment/Work Situation: Employment / Work Situation Employment situation: Employed 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 How long has patient been employed?: 9 months Patient's job has been impacted by current illness: Yes Describe how patient's job has been impacted: states it is causing her anxiety What is the longest time patient has a held a job?: [employer] Where was the patient employed at that time?: [employer] Has patient ever been in the [employer]?: No  Education: Education Is Patient Currently Attending School?: No Last Grade Completed: 12 Did Teacher, adult education From [employer]?: No Did [place]?: No Did Heritage manager?: No Did You Have An Individualized Education Program (IIEP): No Did You Have Any Difficulty At [employer]?: No   CCA Family/Childhood History Family and Relationship History: Family history Marital status: Married What types of issues is patient dealing with in the relationship?: none Additional relationship information: NA Are you sexually active?: Yes What is your sexual orientation?: heterosexual Does patient have children?: Yes How many children?: 3 How is patient's relationship with their children?: ages 51, 28, and 61 -close to all  Childhood History:  Childhood History By whom was/is the patient raised?: Mother Additional childhood history information: father commited suicide when she was a child Description of patient's relationship with  caregiver when they were a child: close and supportive Patient's description of current relationship with people who raised him/her: close and supportive Does patient have siblings?: No Did patient suffer any verbal/emotional/physical/sexual abuse as a child?: No Did patient suffer from severe childhood neglect?: No Has patient ever been sexually abused/assaulted/raped as an adolescent or adult?: No Was the patient ever a victim of a crime or a disaster?: No Witnessed domestic violence?: No Has patient been affected by domestic violence as an adult?: No   CCA Substance Use Alcohol/Drug Use: Alcohol / Drug Use Pain Medications: see MAR Prescriptions: see MAR Over the Counter: see MAR History of alcohol / drug use?: Yes Substance #1 Name of Substance 1: THC 1 - Age of First Use: teens 1 - Amount (size/oz): "a little bit" 1 - Frequency: nightly 1 - Duration: 10 years 1 - Last Use / Amount: 12/19       ASAM's:  Six Dimensions of Multidimensional Assessment  Dimension 1:  Acute Intoxication and/or Withdrawal Potential:      Dimension 2:  Biomedical Conditions and Complications:      Dimension 3:  Emotional, Behavioral, or Cognitive Conditions and Complications:     Dimension 4:  Readiness to Change:     Dimension 5:  Relapse, Continued use, or Continued Problem Potential:     Dimension 6:  Recovery/Living Environment:  ASAM Severity Score:    ASAM Recommended Level of Treatment: ASAM Recommended Level of Treatment: Level I Outpatient Treatment   Substance use Disorder (SUD) Substance Use Disorder (SUD)  Checklist Symptoms of Substance Use: Evidence of tolerance,Presence of craving or strong urge to use,Substance(s) often taken in larger amounts or over longer times than was intended  Recommendations for Services/Supports/Treatments: Recommendations for Services/Supports/Treatments Recommendations For Services/Supports/Treatments: Individual Therapy,Medication  Management  DSM5 Diagnoses: Patient Active Problem List   Diagnosis Date Noted  . Bipolar 2 disorder ([place]) 05/25/2019  . Anxiety 05/25/2019  . Normal intrauterine pregnancy in third trimester 10/22/2016  . [redacted] weeks gestation of pregnancy   . Abnormal findings on antenatal screening    Disposition: Shuvon Rankin, NP recommends discharge from [facility]. Pt has 03/13/20 appointment with [facility] [place], LCSW

## 2020-02-29 NOTE — Progress Notes (Signed)
Pt complained of depression that is characterized by panic attacks and dizziness. Pt stated that she cannot calm down or sleep and have "crawl in a hole" feeling. Pt is recently diagnosed with Bipolar 2. Pt denies SI and HI. Pt stated that she was on Lamictal but it made her "crazy' so she stopped taking it.She is unable to work at this time. She is here for assistance and help because she is caring for 3 small children. She is open to medication. She feels she cannot wait for her appointment on Jan, 3. 2022.

## 2020-03-02 ENCOUNTER — Ambulatory Visit (HOSPITAL_COMMUNITY)
Admission: EM | Admit: 2020-03-02 | Discharge: 2020-03-02 | Disposition: A | Discharge status: Home / Self Care | Payer: 59 | Attending: Psychiatry | Admitting: Psychiatry

## 2020-03-02 ENCOUNTER — Other Ambulatory Visit: Payer: Self-pay

## 2020-03-02 DIAGNOSIS — F419 Anxiety disorder, unspecified: Secondary | ICD-10-CM | POA: Diagnosis not present

## 2020-03-02 DIAGNOSIS — F3181 Bipolar II disorder: Secondary | ICD-10-CM

## 2020-03-02 MED ORDER — GABAPENTIN 100 MG PO CAPS: 100.0000 mg | ORAL_CAPSULE | Freq: Three times a day (TID) | ORAL | 1 refills | Status: DC

## 2020-03-02 MED ORDER — QUETIAPINE FUMARATE 100 MG PO TABS: 100.0000 mg | ORAL_TABLET | Freq: Every day | ORAL | 1 refills | Status: DC

## 2020-03-02 NOTE — ED Provider Notes (Signed)
Behavioral Health Urgent Care Medical Screening Exam  Patient Name: Lori Callahan MRN: [id number] Date of Evaluation: 03/02/20 Chief Complaint:   Diagnosis:  Final diagnoses:  Bipolar 2 disorder ([place])  Anxiety    History of Present illness: Lori Callahan is a 34 y.o. female.  Patient presents to the [facility] voluntarily as a walk-in reporting continued anxiety.  She states that she was here 2 days ago and she was started on Seroquel.  She states that it has helped her some and this is noticed some improvements however she is still having some difficulty with sleep and that she still feels panicky during the day but at the same times feel groggy.  Discussed with her how she is taking her medication and she confirms that she is taken as prescribed of 25 mg p.o. every morning and 50 mg p.o. nightly.  After discussing medications with agreed to discontinue the every morning dosing and to increase the nighttime dosing to 100 mg p.o. nightly.  Also discussed due to her anxiety and panicky feeling to start her on gabapentin 100 mg p.o. 3 times daily.  She is requesting to be restarted on Xanax and she has informed that she will have to have at her regular outpatient provider to prescribe her the controlled substances.  She states understanding and agreement.  She states that she is willing to start the gabapentin to see if it can help her.  She reports that she has been irritable recently and due to her irritability and anxiety she has not been able to continue her job.  She is requesting to be her out of work until Monday to give her time for the medication to work.  She adamantly denies having any suicidal or homicidal ideations and denies any hallucinations.  She states that she only came here to have a med adjustment.  She reports that she is scheduled for an appointment with her outpatient psychiatrist but that appointment is not until February and that she is already scheduled to be into the [facility] program but  that will not start until January 4.  She states that her anxiety has been severe enough that she has been able to perform her daily duties and was seeking immediate help.  At this time the patient does not meet inpatient psychiatric treatment criteria and is psychiatric cleared.  Also did note the patient has presented multiple times in the last week with same symptoms.  Patient presented to [facility] on 02/28/2020, [facility] on 02/29/2020 and then again today at the [facility].  Psychiatric Specialty Exam  Presentation  General Appearance:Appropriate for Environment; Casual  Eye Contact:Good  Speech:Clear and Coherent; Normal Rate  Speech Volume:Normal  Handedness:Right   Mood and Affect  Mood:Anxious  Affect:Appropriate; Congruent   Thought Process  Thought Processes:Coherent  Descriptions of Associations:Intact  Orientation:Full (Time, Place and Person)  Thought Content:WDL  Hallucinations:None  Ideas of Reference:None  Suicidal Thoughts:No  Homicidal Thoughts:No   Sensorium  Memory:Immediate Good; Recent Good; Remote Good  Judgment:Intact  Insight:Good   Executive Functions  Concentration:Good  Attention Span:Good  [place]  Language:Good   Psychomotor Activity  Psychomotor Activity:Normal   Assets  Assets:Communication Skills; Desire for Improvement; Financial Resources/Insurance; Housing; Physical Health; Social Support; Transportation   Sleep  Sleep:Fair  Number of hours: No data recorded  Physical Exam: Physical Exam Vitals and nursing note reviewed.  Constitutional:      Appearance: She is well-developed.  HENT:     Head: Normocephalic.  Eyes:     Pupils: Pupils are equal, round, and reactive to light.  Cardiovascular:     Rate and Rhythm: Normal rate.  Pulmonary:     Effort: Pulmonary effort is normal.  Musculoskeletal:        General: Normal range of motion.  Neurological:     Mental  Status: She is alert and oriented to person, place, and time.    Review of Systems  Constitutional: Negative.   HENT: Negative.   Eyes: Negative.   Respiratory: Negative.   Cardiovascular: Negative.   Gastrointestinal: Negative.   Genitourinary: Negative.   Musculoskeletal: Negative.   Skin: Negative.   Neurological: Negative.   Endo/Heme/Allergies: Negative.   Psychiatric/Behavioral: The patient is nervous/anxious.    Blood pressure 112/79, pulse 96, temperature 97.8 F (36.6 C), temperature source Tympanic, resp. rate 18, height 5\' 5"  (1.651 m), weight 205 lb (93 kg), last menstrual period 02/21/2020, SpO2 100 %. Body mass index is 34.11 kg/m.  Musculoskeletal: Strength & Muscle Tone: within normal limits Gait & Station: normal Patient leans: N/A   [place] MSE Discharge Disposition for Follow up and Recommendations: Based on my evaluation the patient does not appear to have an emergency medical condition and can be discharged with resources and follow up care in outpatient services for Medication Management and [place], FNP 03/02/2020, 12:13 PM

## 2020-03-02 NOTE — Discharge Instructions (Signed)

## 2020-03-02 NOTE — ED Triage Notes (Signed)
Patient states she was seen a few days ago and feels she is doing no better. Patient states she is still having anxiety and feeling irritable and agitated and feels panicky. Patient is taking medication and felt like she wanted to sleep but had difficulty getting to sleep.Patient denies SI/HI and A/V/H. Patient states appetite is good and concentration is poor.

## 2020-03-02 NOTE — BH Assessment (Signed)
Comprehensive Clinical Assessment (CCA) Note  03/02/2020 Lori Callahan [id number]  Visit Diagnosis: F41.0 Panic Disorder;F31.81 Bipolar II Disposition: Lori Pickles, NP recommends discharge from [facility]. Pt has 03/13/20 appointment with [facility]. She also has an appt scheduled for outpt with another provider which pt may do instead of PHP.   Lori Callahan is a 34 yo married female who presents voluntarily to [facility] . Pt was also seen on 02/29/20 & 02/28/20. Pt reports ongoing problems with medication not helping her anxiety. She states med prescribed at 03/01/19 visit made her sleepy but didn't help her fall asleep. Pt has 03/13/20 appointment with [facility]. She also has an appt scheduled for outpt with another provider which she states she may do instead of PHP. Pt continues to deny SI, HI and AVH.  02/29/20 Assessment Note: Lori Callahan is a 35 yo married female who presents voluntarily to [facility]. She was unaccompanied and reporting symptoms of increasing anxiety. Pt denies current suicidal ideation. Pt acknowledges multiple symptoms of Depression, including anhedonia, isolating, feelings of worthlessness & guilt, tearfulness & increased irritability. Pt denies homicidal ideation/ history of violence. Pt denies auditory & visual hallucinations & other symptoms of psychosis. Pt states current stressors include recent negative reaction to Lamictal and increased sx of anxiety. Pt is set to start [facility] 03/13/20. She states she can't tolerate current level of anxiety until that appointment & is requesting medication.    MSE: Pt is casually dressed, alert, oriented x 5  with normal speech and normal motor behavior. Eye contact is good. Pt's mood is depressed and anxious and affect is congruent with mood. Thought process is coherent and relevant. There is no indication pt is currently responding to internal stimuli or experiencing delusional thought content. Pt was cooperative throughout assessment.     02/28/20 Assessment Note:  Patient is a 34 year old female presenting voluntarily to [facility] ED for assessment of anxiety, depression, and a panic attack this morning. Patient reports increased depression for the past month. This morning patient reports she felt light-headed, increased heart rate, blurred vision, and shortness of breath, and chest pain, which she attributes to a panic attack. Patient reports panic attacks on about monthly basis. She denies SI/HI/AVH. Patient reports she was diagnosed with Bipolar II in April of last year and was prescribed Lamictal. She states this medication caused increased SI so she discontinued. She does not have a current outpatient provider. Her PCP prescribed Xanax .25 mg PRN for panic but she is currently between physicians and is unable to get this filled. Patient endorses using THC ona nightly basis for the past 10 years.She states she does not feel she needs to be hospitalized but is interested in [facility] program.    Chief Complaint:  Chief Complaint  Patient presents with  . Medication Problem  . Anxiety  . Agitation    CCA Screening, Triage and Referral (STR)  Patient Reported Information How did you hear about [place]? Hospital Discharge ([place] 03/02/2020)  Referral name: [name] ([place] 03/02/2020)  Referral phone number: No data recorded  Whom do you see for routine medical problems? I don't have a doctor ([name] 03/02/2020)  Practice/Facility Name: No data recorded Practice/Facility Phone Number: No data recorded Name of Contact: No data recorded Contact Number: No data recorded Contact Fax Number: No data recorded Prescriber Name: No data recorded Prescriber Address (if known): No data recorded  What Is the Reason for Your Visit/Call Today? very anxious and Irritable [facility] ([facility] 03/02/2020)  How Long Has This Been Causing You Problems? 1 wk - 1 month ([facility] 03/02/2020)  What Do You Feel Would Help  You the Most Today? Other (Comment) ([facility] 03/02/2020)   Have You Recently Been in Any Inpatient Treatment (Hospital/Detox/Crisis Center/28-Day Program)? No ([facility] 03/02/2020)  Name/Location of Program/Hospital: [facility] ([place] 03/02/2020)  How Long Were You There? 5 Days ([facility] 03/02/2020)  When Were You Discharged? No data recorded  Have You Ever Received Services From [facility] Before? Yes ([facility] 03/02/2020)  Who Do You See at [name]? Na ([facility] 03/02/2020)   Have You Recently Had Any Thoughts About Hurting Yourself? No ([facility] 03/02/2020)  Are You Planning to Commit Suicide/Harm Yourself At This time? No ([facility] 03/02/2020)   Have you Recently Had Thoughts About [place]? No ([facility]reesia 03/02/2020)  Explanation: No data recorded  Have You Used Any Alcohol or Drugs in the Past 24 Hours? No ([facility] 03/02/2020)  How Long Ago Did You Use Drugs or Alcohol? No data recorded What Did You Use and How Much? No data recorded  Do You Currently Have a Therapist/Psychiatrist? No ([facility] 03/02/2020)  Name of Therapist/Psychiatrist: No data recorded  Have You Been Recently Discharged From Any Office Practice or Programs? No ([facility] 03/02/2020)  Explanation of Discharge From Practice/Program: [name] ([facility] 03/02/2020)     CCA Screening Triage Referral Assessment Type of Contact: Face-to-Face   Patient Reported Information Reviewed? Yes   Collateral Involvement: No data recorded  Patient Determined To Be At Risk for Harm To Self or Others Based on Review of Patient Reported Information or Presenting Complaint? No   Location of Assessment: [facility]   Does Patient Present under Involuntary Commitment? No  IVC Papers Initial File Date: No data recorded  [place] of Residence: [place]   Patient Currently Receiving the Following Services: Medication Management   Determination of Need:  Routine (7 days)   Options For Referral: Intensive Outpatient Therapy; Medication Management; Outpatient Therapy    CCA Biopsychosocial Intake/Chief Complaint:  reporting increased anxiety with panic attacks and dizziness. negative reaction to seroquel; wants a different medication  Current Symptoms/Problems: anxiety   Patient Reported Schizophrenia/Schizoaffective Diagnosis in Past: No   Strengths: established services  Preferences: NA  Abilities: NA   Type of Services Patient Feels are Needed: med mngt   Initial Clinical Notes/Concerns: NA   Mental Health Symptoms Depression:  Change in energy/activity; Difficulty Concentrating; Fatigue; Hopelessness; Irritability; Tearfulness; Worthlessness   Duration of Depressive symptoms: Greater than two weeks   Mania:  Irritability   Anxiety:   Worrying; Tension; Fatigue; Irritability; Difficulty concentrating   Psychosis:  None   Duration of Psychotic symptoms: No data recorded  Trauma:  None   Obsessions:  None   Compulsions:  None   Inattention:  None   Hyperactivity/Impulsivity:  N/A   Oppositional/Defiant Behaviors:  N/A   Emotional Irregularity:  N/A   Other Mood/Personality Symptoms:  No data recorded   Mental Status Exam Appearance and self-care  Stature:  Average   Weight:  Average weight   Clothing:  Neat/clean   Grooming:  Normal   Cosmetic use:  None   Posture/gait:  Normal   Motor activity:  Not Remarkable   Sensorium  Attention:  Normal   Concentration:  Normal   Orientation:  X5   Recall/memory:  Normal   Affect and Mood  Affect:  Anxious; Depressed   Mood:  Anxious; Depressed   Relating  Eye contact:  Normal   Facial expression:  Anxious   Attitude toward examiner:  Cooperative   Thought and Language  Speech flow: Clear and Coherent   Thought content:  Appropriate to Mood and Circumstances   Preoccupation:  None   Hallucinations:  None   Organization:  No  data recorded  [employer] of Knowledge:  Good   Intelligence:  Average   Abstraction:  Normal   Judgement:  Good   Reality Testing:  Realistic   Insight:  Fair   Decision Making:  Normal   Social Functioning  Social Maturity:  Isolates   Social Judgement:  Normal   Stress  Stressors:  Work; Illness   Coping Ability:  Deficient supports   Skill Deficits:  Responsibility   Supports:  Family; Friends/Service system     Religion: Religion/Spirituality Are You A Religious Person?: No  Leisure/Recreation: Leisure / Recreation Do You Have Hobbies?: No  Exercise/Diet: Exercise/Diet Do You Exercise?: No Have You Gained or Lost A Significant Amount of Weight in the Past Six Months?: No Do You Follow a Special Diet?: No Do You Have Any Trouble Sleeping?: Yes Explanation of Sleeping Difficulties: on Seroquel she felt sleepy but difficulty falling asleep   CCA Employment/Education Employment/Work Situation: Employment / Work Situation Employment situation: Employed 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 How long has patient been employed?: 9 months Patient's job has been impacted by current illness: Yes Describe how patient's job has been impacted: states it is causing her anxiety What is the longest time patient has a held a job?: [employer] Where was the patient employed at that time?: [employer] Has patient ever been in the [employer]?: No  Education: Education Is Patient Currently Attending School?: No Last Grade Completed: 12 Did Teacher, adult education From [employer]?: No Did [place]?: No Did Heritage manager?: No Did You Have An Individualized Education Program (IIEP): No Did You Have Any Difficulty At [employer]?: No   CCA Family/Childhood History Family and Relationship History: Family history Marital status: Married What types of issues is patient dealing with in the relationship?: none Additional relationship information:  NA Are you sexually active?: Yes What is your sexual orientation?: heterosexual Does patient have children?: Yes How many children?: 3 How is patient's relationship with their children?: ages 12, 20, and 54 -close to all  Childhood History:  Childhood History By whom was/is the patient raised?: Mother Additional childhood history information: father commited suicide when she was a child Description of patient's relationship with caregiver when they were a child: close and supportive Patient's description of current relationship with people who raised him/her: close and supportive Does patient have siblings?: No Did patient suffer any verbal/emotional/physical/sexual abuse as a child?: No Did patient suffer from severe childhood neglect?: No Has patient ever been sexually abused/assaulted/raped as an adolescent or adult?: No Was the patient ever a victim of a crime or a disaster?: No Witnessed domestic violence?: No Has patient been affected by domestic violence as an adult?: No    CCA Substance Use Alcohol/Drug Use: Alcohol / Drug Use Pain Medications: see MAR Prescriptions: see MAR Over the Counter: see MAR History of alcohol / drug use?: Yes Substance #1 Name of Substance 1: THC 1 - Age of First Use: teens 1 - Amount (size/oz): "a little bit" 1 - Frequency: nightly 1 - Duration: 10 years 1 - Last Use / Amount: 12/19  ASAM's:  Six Dimensions of Multidimensional Assessment  Dimension 1:  Acute  Intoxication and/or Withdrawal Potential:      Dimension 2:  Biomedical Conditions and Complications:      Dimension 3:  Emotional, Behavioral, or Cognitive Conditions and Complications:     Dimension 4:  Readiness to Change:     Dimension 5:  Relapse, Continued use, or Continued Problem Potential:     Dimension 6:  Recovery/Living Environment:     ASAM Severity Score:    ASAM Recommended Level of Treatment: ASAM Recommended Level of Treatment: Level I Outpatient Treatment    Substance use Disorder (SUD)    Recommendations for Services/Supports/Treatments: Recommendations for Services/Supports/Treatments Recommendations For Services/Supports/Treatments: Individual Therapy,Medication Management  DSM5 Diagnoses: Patient Active Problem List   Diagnosis Date Noted  . Bipolar 2 disorder ([place]) 05/25/2019  . Anxiety 05/25/2019  . Normal intrauterine pregnancy in third trimester 10/22/2016  . [redacted] weeks gestation of pregnancy   . Abnormal findings on antenatal screening      Disposition: Lori Pickles, NP recommends discharge from [facility]. Pt has 03/13/20 appointment with [facility]. She also has an appt scheduled for outpt with another provider which pt may do instead of PHP. Lori Callahan Tora Perches, LCSW

## 2020-03-02 NOTE — ED Notes (Signed)
Patient belongs in [place]

## 2020-03-02 NOTE — ED Notes (Signed)
Discharge instructions provided and Pt stated understanding. Personal belongs returned and escorted to the front lobby. Pt alert, orientated and ambulatory. Safety maintained.

## 2020-03-11 DIAGNOSIS — U071 COVID-19: Secondary | ICD-10-CM

## 2020-03-11 HISTORY — DX: COVID-19: U07.1

## 2020-03-13 ENCOUNTER — Ambulatory Visit (HOSPITAL_COMMUNITY): Payer: 59

## 2020-03-14 ENCOUNTER — Encounter: Payer: Self-pay | Admitting: Physician Assistant

## 2020-03-14 ENCOUNTER — Ambulatory Visit (INDEPENDENT_AMBULATORY_CARE_PROVIDER_SITE_OTHER): Payer: 59 | Admitting: Physician Assistant

## 2020-03-14 ENCOUNTER — Other Ambulatory Visit: Payer: Self-pay

## 2020-03-14 VITALS — BP 100/62 | HR 90 | Temp 97.7°F | Ht 65.0 in | Wt 205.4 lb

## 2020-03-14 DIAGNOSIS — F3181 Bipolar II disorder: Secondary | ICD-10-CM

## 2020-03-14 DIAGNOSIS — F419 Anxiety disorder, unspecified: Secondary | ICD-10-CM

## 2020-03-14 DIAGNOSIS — E039 Hypothyroidism, unspecified: Secondary | ICD-10-CM | POA: Diagnosis not present

## 2020-03-14 DIAGNOSIS — D229 Melanocytic nevi, unspecified: Secondary | ICD-10-CM

## 2020-03-14 DIAGNOSIS — Z124 Encounter for screening for malignant neoplasm of cervix: Secondary | ICD-10-CM | POA: Diagnosis not present

## 2020-03-14 LAB — CBC WITH DIFFERENTIAL/PLATELET
Basophils Absolute: 0 10*3/uL (ref 0.0–0.1)
Basophils Relative: 0.7 % (ref 0.0–3.0)
Eosinophils Absolute: 0.1 10*3/uL (ref 0.0–0.7)
Eosinophils Relative: 2.4 % (ref 0.0–5.0)
HCT: 39.6 % (ref 36.0–46.0)
Hemoglobin: 13.3 g/dL (ref 12.0–15.0)
Lymphocytes Relative: 38.7 % (ref 12.0–46.0)
Lymphs Abs: 2 10*3/uL (ref 0.7–4.0)
MCHC: 33.6 g/dL (ref 30.0–36.0)
MCV: 90.8 fl (ref 78.0–100.0)
Monocytes Absolute: 0.3 10*3/uL (ref 0.1–1.0)
Monocytes Relative: 5.6 % (ref 3.0–12.0)
Neutro Abs: 2.7 10*3/uL (ref 1.4–7.7)
Neutrophils Relative %: 52.6 % (ref 43.0–77.0)
Platelets: 227 10*3/uL (ref 150.0–400.0)
RBC: 4.37 Mil/uL (ref 3.87–5.11)
RDW: 13.5 % (ref 11.5–15.5)
WBC: 5.1 10*3/uL (ref 4.0–10.5)

## 2020-03-14 LAB — COMPREHENSIVE METABOLIC PANEL
ALT: 24 U/L (ref 0–35)
AST: 19 U/L (ref 0–37)
Albumin: 4.4 g/dL (ref 3.5–5.2)
Alkaline Phosphatase: 55 U/L (ref 39–117)
BUN: 8 mg/dL (ref 6–23)
CO2: 26 mEq/L (ref 19–32)
Calcium: 9.1 mg/dL (ref 8.4–10.5)
Chloride: 106 mEq/L (ref 96–112)
Creatinine, Ser: 0.69 mg/dL (ref 0.40–1.20)
GFR: 113.34 mL/min (ref >60.00)
Glucose, Bld: 77 mg/dL (ref 70–99)
Potassium: 3.9 mEq/L (ref 3.5–5.1)
Sodium: 138 mEq/L (ref 135–145)
Total Bilirubin: 0.4 mg/dL (ref 0.2–1.2)
Total Protein: 7.6 g/dL (ref 6.0–8.3)

## 2020-03-14 LAB — TSH: TSH: 6.59 u[IU]/mL — ABNORMAL HIGH (ref 0.35–4.50)

## 2020-03-14 MED ORDER — ALPRAZOLAM 0.5 MG PO TABS: 0.5000 mg | ORAL_TABLET | Freq: Every evening | ORAL | 0 refills | Status: DC | PRN

## 2020-03-14 NOTE — Patient Instructions (Signed)
It was great to see you!  Short term xanax refilled for you.  Labs today and we will be in touch regarding resuming thyroid medication.  Return at your convenience for mole biopsy.  Referral to gynecology placed today. Someone should contact you within the next few weeks for this appointment.  Take care,  Jarold Motto PA-C

## 2020-03-14 NOTE — Progress Notes (Signed)
Lori Callahan is a 35 y.o. female is here to establish care and discuss issues.  I acted as a Neurosurgeon for [employer], PA-C Corky Mull, LPN   History of Present Illness:   Chief Complaint  Patient presents with  . Establish Care  . Hypothyroidism  . Anxiety    HPI  Hypothyroidism Pt has been off her medication Levothyroxine 50 mcg for 3 -4 weeks and is due for blood work. She was diagnosed earlier this year, around March. Denies any concerns with levothyroxine when she was taking it. Knows to take on empty stomach first thing in the morning.   Anxiety/Bipolar 2 Pt was diagnosed with Bipolar 2 in April 2021. Started on Lamictal -- caused SI Seroquel -- too sedating Gabapentin -- ineffective Pt says she is feeling more anxious then usual. Pt says it gets to the point where gets nauseous. Pt went to [facility], 12/23. Pt is scheduled to see a Psychiatrist 03/24/2020.  Was on 0.25 mg xanax daily and this was the most effective for her, would require 0.5 mg daily.  Wanting to start therapy soon.   Nevus Has an irregular mole on L foot. Would like it removed. Has been changing shape and is now tender to touch.   PAP smear In need of pap smear, would like a referral.   Health Maintenance Due  Topic Date Due  . Hepatitis C Screening  Never done  . PAP SMEAR-Modifier  04/05/2019    Past Medical History:  Diagnosis Date  . ADHD (attention deficit hyperactivity disorder)   . Anxiety   . Bipolar disorder (HCC)   . Depression   . Herpes genitalia   . HPV (human papilloma virus) infection   . Hypothyroidism      Social History   Tobacco Use  . Smoking status: Former Smoker    Packs/day: 0.50    Years: 10.00    Pack years: 5.00    Types: Cigarettes    Quit date: 11/10/2019    Years since quitting: 0.3  . Smokeless tobacco: Never Used  Vaping Use  . Vaping Use: Every day  . Start date: 11/10/2019  . Substances: Nicotine  Substance  Use Topics  . Alcohol use: Yes    Comment: 2 shots a month  . Drug use: Yes    Types: Marijuana    Comment: nightly    Past Surgical History:  Procedure Laterality Date  . CESAREAN SECTION    . CESAREAN SECTION  03/02/2012   Procedure: CESAREAN SECTION;  Surgeon: Mickel Baas, MD;  Location: [facility];  Service: Obstetrics;  Laterality: N/A;  . CESAREAN SECTION N/A 10/22/2016   Procedure: CESAREAN SECTION;  Surgeon: Myna Hidalgo, DO;  Location: [facility];  Service: Obstetrics;  Laterality: N/A;  . TONSILLECTOMY AND ADENOIDECTOMY    . TUBAL LIGATION Bilateral 10/22/2016   Procedure: POST PARTUM TUBAL LIGATION;  Surgeon: Myna Hidalgo, DO;  Location: [facility];  Service: Obstetrics;  Laterality: Bilateral;    Family History  Problem Relation Age of Onset  . Breast cancer Maternal Grandmother        x2  . Leukemia Maternal Grandmother   . Skin cancer Mother     PMHx, SurgHx, SocialHx, FamHx, Medications, and Allergies were reviewed in the Visit Navigator and updated as appropriate.   Patient Active Problem List   Diagnosis Date Noted  . Bipolar 2 disorder (HCC) 05/25/2019  . Anxiety 05/25/2019    Social  History   Tobacco Use  . Smoking status: Former Smoker    Packs/day: 0.50    Years: 10.00    Pack years: 5.00    Types: Cigarettes    Quit date: 11/10/2019    Years since quitting: 0.3  . Smokeless tobacco: Never Used  Vaping Use  . Vaping Use: Every day  . Start date: 11/10/2019  . Substances: Nicotine  Substance Use Topics  . Alcohol use: Yes    Comment: 2 shots a month  . Drug use: Yes    Types: Marijuana    Comment: nightly    Current Medications and Allergies:    Current Outpatient Medications:  .  ALPRAZolam (XANAX) 0.5 MG tablet, Take 1 tablet (0.5 mg total) by mouth at bedtime as needed for anxiety., Disp: 14 tablet, Rfl: 0 .  levothyroxine (SYNTHROID) 50 MCG tablet, , Disp: , Rfl:    Allergies  Allergen Reactions  . Reglan  [Metoclopramide] Shortness Of Breath    Review of Systems   ROS  Negative unless otherwise specified per HPI.  Vitals:   Vitals:   03/14/20 0846  BP: 100/62  Pulse: 90  Temp: 97.7 F (36.5 C)  TempSrc: Temporal  SpO2: 99%  Weight: 205 lb 6.1 oz (93.2 kg)  Height: 5\' 5"  (1.651 m)     Body mass index is 34.18 kg/m.   Physical Exam:    Physical Exam Vitals and nursing note reviewed.  Constitutional:      General: She is not in acute distress.    Appearance: She is well-developed. She is not ill-appearing, toxic-appearing or sickly-appearing.  Cardiovascular:     Rate and Rhythm: Normal rate and regular rhythm.     Pulses: Normal pulses.     Heart sounds: Normal heart sounds, S1 normal and S2 normal.     Comments: No LE edema Pulmonary:     Effort: Pulmonary effort is normal.     Breath sounds: Normal breath sounds.  Skin:    General: Skin is warm, dry and intact.     Comments: Approximately 1 cm round brown papule with irregular borders to top of L foot  Neurological:     Mental Status: She is alert.     GCS: GCS eye subscore is 4. GCS verbal subscore is 5. GCS motor subscore is 6.  Psychiatric:        Mood and Affect: Mood and affect normal.        Speech: Speech normal.        Behavior: Behavior normal. Behavior is cooperative.      Assessment and Plan:    Aadhya was seen today for establish care, hypothyroidism and anxiety.  Diagnoses and all orders for this visit:  Acquired hypothyroidism Update blood work today and resume levothyroxine as indicated. -     TSH -     T4  Bipolar 2 disorder ([place]); Anxiety Uncontrolled. Short term refill of xanax to get her to new psych appointment on 03/24/20. I discussed with patient that if they develop any SI, to tell someone immediately and seek medical attention. -     CBC with Differential/Platelet -     Comprehensive metabolic panel  Pap smear for cervical cancer screening -     Ambulatory referral to  Gynecology  Nevus Discussed options of shave bx or derm referral. She would like shave bx but is not prepared to do today.  Return at her convenience for removal.  Other orders -  ALPRAZolam (XANAX) 0.5 MG tablet; Take 1 tablet (0.5 mg total) by mouth at bedtime as needed for anxiety.   CMA or LPN served as scribe during this visit. History, Physical, and Plan performed by medical provider. The above documentation has been reviewed and is accurate and complete.   Inda Coke, PA-C [place], [place] 03/14/2020  Follow-up: No follow-ups on file.

## 2020-03-15 ENCOUNTER — Other Ambulatory Visit: Payer: Self-pay | Admitting: Physician Assistant

## 2020-03-15 DIAGNOSIS — E039 Hypothyroidism, unspecified: Secondary | ICD-10-CM

## 2020-03-15 LAB — T4: T4, Total: 8.9 ug/dL (ref 5.1–11.9)

## 2020-03-15 LAB — TIQ-MISC

## 2020-03-16 ENCOUNTER — Other Ambulatory Visit (INDEPENDENT_AMBULATORY_CARE_PROVIDER_SITE_OTHER): Payer: 59

## 2020-03-16 DIAGNOSIS — E039 Hypothyroidism, unspecified: Secondary | ICD-10-CM

## 2020-03-16 LAB — T4, FREE: Free T4: 0.84 ng/dL (ref 0.60–1.60)

## 2020-03-16 NOTE — Addendum Note (Signed)
Addended by: Lurlean Horns on: 03/16/2020 07:53 AM   Modules accepted: Orders

## 2020-03-17 ENCOUNTER — Other Ambulatory Visit: Payer: Self-pay

## 2020-03-17 ENCOUNTER — Other Ambulatory Visit (HOSPITAL_COMMUNITY)
Admission: RE | Admit: 2020-03-17 | Discharge: 2020-03-17 | Disposition: A | Discharge status: Home / Self Care | Payer: 59 | Source: Ambulatory Visit | Attending: Physician Assistant | Admitting: Physician Assistant

## 2020-03-17 ENCOUNTER — Ambulatory Visit: Payer: 59 | Admitting: Physician Assistant

## 2020-03-17 ENCOUNTER — Encounter: Payer: Self-pay | Admitting: Physician Assistant

## 2020-03-17 VITALS — BP 100/70 | HR 105 | Temp 97.9°F | Ht 65.0 in | Wt 205.0 lb

## 2020-03-17 DIAGNOSIS — C4372 Malignant melanoma of left lower limb, including hip: Secondary | ICD-10-CM | POA: Diagnosis not present

## 2020-03-17 DIAGNOSIS — D229 Melanocytic nevi, unspecified: Secondary | ICD-10-CM | POA: Insufficient documentation

## 2020-03-17 DIAGNOSIS — E039 Hypothyroidism, unspecified: Secondary | ICD-10-CM

## 2020-03-17 MED ORDER — LEVOTHYROXINE SODIUM 50 MCG PO TABS: 50.0000 ug | ORAL_TABLET | Freq: Every day | ORAL | 1 refills | Status: DC

## 2020-03-17 NOTE — Progress Notes (Signed)
Lori Callahan is a 35 y.o. female is here for mole removal.  I acted as a Education administrator for [employer], PA-C Lori Pickler, LPN   History of Present Illness:   Chief Complaint  Patient presents with  . Mole removal    HPI  Mole removal Pt here today for mole removal top of left foot. Area has grown in size over past few months and is tender. Denies prior hx of known skin cancer for herself.  Hypothyroidism She is following up on lab results today. She would like to resume 50 mcg levothyroxine at this time, as she did feel better on this medication. Lab Results  Component Value Date   TSH 6.59 (H) 03/14/2020   T4TOTAL 8.9 03/14/2020        Health Maintenance Due  Topic Date Due  . Hepatitis C Screening  Never done  . PAP SMEAR-Modifier  04/05/2019    Past Medical History:  Diagnosis Date  . ADHD (attention deficit hyperactivity disorder)   . Anxiety   . Bipolar disorder ([place])   . Depression   . Herpes genitalia   . HPV (human papilloma virus) infection   . Hypothyroidism      Social History   Tobacco Use  . Smoking status: Former Smoker    Packs/day: 0.50    Years: 10.00    Pack years: 5.00    Types: Cigarettes    Quit date: 11/10/2019    Years since quitting: 0.3  . Smokeless tobacco: Never Used  Vaping Use  . Vaping Use: Every day  . Start date: 11/10/2019  . Substances: Nicotine  Substance Use Topics  . Alcohol use: Yes    Comment: 2 shots a month  . Drug use: Yes    Types: Marijuana    Comment: nightly    Past Surgical History:  Procedure Laterality Date  . CESAREAN SECTION    . CESAREAN SECTION  03/02/2012   Procedure: CESAREAN SECTION;  Surgeon: Lori Butters, MD;  Location: [facility];  Service: Obstetrics;  Laterality: N/A;  . CESAREAN SECTION N/A 10/22/2016   Procedure: CESAREAN SECTION;  Surgeon: Lori Pupa, DO;  Location: [facility];  Service: Obstetrics;  Laterality: N/A;  . TONSILLECTOMY AND ADENOIDECTOMY    . TUBAL  LIGATION Bilateral 10/22/2016   Procedure: POST PARTUM TUBAL LIGATION;  Surgeon: Lori Pupa, DO;  Location: [facility];  Service: Obstetrics;  Laterality: Bilateral;    Family History  Problem Relation Age of Onset  . Breast cancer Maternal Grandmother        x2  . Leukemia Maternal Grandmother   . Skin cancer Mother     PMHx, SurgHx, SocialHx, FamHx, Medications, and Allergies were reviewed in the Visit Navigator and updated as appropriate.   Patient Active Problem List   Diagnosis Date Noted  . Bipolar 2 disorder ([place]) 05/25/2019  . Anxiety 05/25/2019    Social History   Tobacco Use  . Smoking status: Former Smoker    Packs/day: 0.50    Years: 10.00    Pack years: 5.00    Types: Cigarettes    Quit date: 11/10/2019    Years since quitting: 0.3  . Smokeless tobacco: Never Used  Vaping Use  . Vaping Use: Every day  . Start date: 11/10/2019  . Substances: Nicotine  Substance Use Topics  . Alcohol use: Yes    Comment: 2 shots a month  . Drug use: Yes    Types: Marijuana  Comment: nightly    Current Medications and Allergies:    Current Outpatient Medications:  .  ALPRAZolam (XANAX) 0.5 MG tablet, Take 1 tablet (0.5 mg total) by mouth at bedtime as needed for anxiety., Disp: 14 tablet, Rfl: 0 .  levothyroxine (SYNTHROID) 50 MCG tablet, Take 1 tablet (50 mcg total) by mouth daily before breakfast., Disp: 90 tablet, Rfl: 1   Allergies  Allergen Reactions  . Reglan [Metoclopramide] Shortness Of Breath    Review of Systems   ROS  Negative unless otherwise specified per HPI.  Vitals:   Vitals:   03/17/20 1105  BP: 100/70  Pulse: (!) 105  Temp: 97.9 F (36.6 C)  TempSrc: Temporal  SpO2: 99%  Weight: 205 lb (93 kg)  Height: 5\' 5"  (1.651 m)     Body mass index is 34.11 kg/m.   Physical Exam:    Physical Exam Constitutional:      Appearance: She is well-developed and well-nourished.  HENT:     Head: Normocephalic and atraumatic.  Eyes:      Extraocular Movements: EOM normal.     Conjunctiva/sclera: Conjunctivae normal.  Pulmonary:     Effort: Pulmonary effort is normal.  Musculoskeletal:        General: Normal range of motion.     Cervical back: Normal range of motion and neck supple.  Skin:    General: Skin is warm and dry.     Comments: 1 cm round dark brown macule with irregular borders to top of L mid-foot  Neurological:     Mental Status: She is alert and oriented to person, place, and time.  Psychiatric:        Mood and Affect: Mood and affect normal.        Behavior: Behavior normal.        Thought Content: Thought content normal.        Judgment: Judgment normal.    Shave biopsy  Meds, vitals, and allergies reviewed.   Indication: suspicious lesion  Location: L mid foot, top  Size: approx 1 cm  Informed consent obtained.  Pt aware of risks not limited to but including infection,  bleeding, damage to near by organs.  Prep: etoh/betadine  Anesthesia: 1%lidocaine with epi, good effect  Shave made with dermablade  Minimal oozing, controlled with silver nitrate  Tolerated well      Assessment and Plan:    Lori Callahan was seen today for mole removal.  Diagnoses and all orders for this visit:  Nevus Shave biopsy performed today. Routine postprocedure instructions d/w pt- keep area clean and bandaged, follow up if concerns/spreading erythema/pain. -     Surgical pathology( [place]/ [facility])  Acquired hypothyroidism After discussion with patient, will resume levothyroxine 50 mcg daily. Recheck labs only in 3 months, sooner if concerns.  Other orders -     levothyroxine (SYNTHROID) 50 MCG tablet; Take 1 tablet (50 mcg total) by mouth daily before breakfast.   CMA or LPN served as scribe during this visit. History, Physical, and Plan performed by medical provider. The above documentation has been reviewed and is accurate and complete.  Lori Coke, PA-C [place] [place] 03/17/2020  Follow-up: No follow-ups on file.

## 2020-03-17 NOTE — Patient Instructions (Signed)
It was great to see you!  Mole removal today -- we will be in touch with results, you did great!  Restart levothyroxine 50 mcg daily. Follow-up for LAB ONLY appointment in 3 months.  Take care,  Inda Coke PA-C

## 2020-03-19 ENCOUNTER — Encounter: Payer: Self-pay | Admitting: Physician Assistant

## 2020-03-22 ENCOUNTER — Other Ambulatory Visit: Payer: Self-pay | Admitting: Physician Assistant

## 2020-03-22 ENCOUNTER — Telehealth: Payer: Self-pay | Admitting: Physician Assistant

## 2020-03-22 DIAGNOSIS — C4372 Malignant melanoma of left lower limb, including hip: Secondary | ICD-10-CM

## 2020-03-22 LAB — SURGICAL PATHOLOGY

## 2020-03-22 NOTE — Telephone Encounter (Signed)
Error

## 2020-03-23 ENCOUNTER — Telehealth: Payer: Self-pay | Admitting: *Deleted

## 2020-03-23 NOTE — Telephone Encounter (Signed)
Please see message. °

## 2020-03-23 NOTE — Telephone Encounter (Signed)
-----   Message from [place], [place] sent at 03/22/2020  9:23 PM EST ----- Regarding: check in on Amiel? Butch Penny,  Please call Latesia and see how her appointment with the surgical oncologist went.  She is probably going to have a lot of appointments with them and will not be seeing [place] for a while but let her know that we are here for her if she needs anything, especially if she starts to develop worsening anxiety.  Thanks!  Sam

## 2020-03-23 NOTE — Telephone Encounter (Signed)
Spoke to pt went to appt yesterday and is scheduled for Surgery on 03/30/2020. They are going to excise a bigger area, skin graft and take some Lymph nodes out. Told pt sorry to hear that hope everything goes well. So you are probably going to have a lot of appointments with them and will not be seeing [place] for a while but wanted to let you  know that [facility] and I are here for you if she need anything, especially if you start to develop worsening anxiety. Pt verbalized understanding and thanked [place]. Told her to take care.

## 2020-03-24 ENCOUNTER — Other Ambulatory Visit: Payer: Self-pay | Admitting: Physician Assistant

## 2020-03-24 ENCOUNTER — Encounter: Payer: Self-pay | Admitting: Physician Assistant

## 2020-03-24 MED ORDER — ALPRAZOLAM 0.5 MG PO TABS: 0.5000 mg | ORAL_TABLET | Freq: Every evening | ORAL | 1 refills | Status: DC | PRN

## 2020-03-24 NOTE — Telephone Encounter (Signed)
Patient has also called in regard.  States that she does not have enough meds to get her through the weekend.  States she take one and 1/2 pill to sleep and night and one in the AM in order to cope with cancer diagnosis.    Please follow up with patient in regard at [phone].

## 2020-03-25 ENCOUNTER — Encounter: Payer: Self-pay | Admitting: Physician Assistant

## 2020-03-25 ENCOUNTER — Other Ambulatory Visit: Payer: Self-pay | Admitting: Physician Assistant

## 2020-03-25 LAB — THYROID PANEL
Free Thyroxine Index: 1.7
T3 Uptake: 21
Thyroxine (T4): 8.1
Triiodothyronine (T3): 133

## 2020-03-28 ENCOUNTER — Encounter: Payer: Self-pay | Admitting: Physician Assistant

## 2020-03-28 ENCOUNTER — Other Ambulatory Visit: Payer: Self-pay | Admitting: *Deleted

## 2020-03-28 ENCOUNTER — Other Ambulatory Visit (INDEPENDENT_AMBULATORY_CARE_PROVIDER_SITE_OTHER): Payer: 59

## 2020-03-28 ENCOUNTER — Other Ambulatory Visit: Payer: Self-pay

## 2020-03-28 ENCOUNTER — Telehealth: Payer: Self-pay

## 2020-03-28 DIAGNOSIS — R509 Fever, unspecified: Secondary | ICD-10-CM | POA: Diagnosis not present

## 2020-03-28 DIAGNOSIS — Z20822 Contact with and (suspected) exposure to covid-19: Secondary | ICD-10-CM

## 2020-03-28 DIAGNOSIS — R059 Cough, unspecified: Secondary | ICD-10-CM

## 2020-03-28 LAB — POC INFLUENZA A&B (BINAX/QUICKVUE)
Influenza A, POC: NEGATIVE
Influenza B, POC: NEGATIVE

## 2020-03-28 NOTE — Telephone Encounter (Signed)
Noted  

## 2020-03-28 NOTE — Telephone Encounter (Signed)
Nurse Assessment Nurse: May, RN, Tammy Date/Time Lori Callahan Time): 03/28/2020 11:20:31 AM Confirm and document reason for call. If symptomatic, describe symptoms. ---Caller states she is scheduled for surgery on Thursday. Caller states she had a preop covid test on Friday and was negative. On Saturday she started with a fever, cough, sneezing, body aches, and chills. Caller states she has been off work since Monday. Does the patient have any new or worsening symptoms? ---Yes Will a triage be completed? ---Yes Related visit to physician within the last 2 weeks? ---Yes Does the PT have any chronic conditions? (i.e. diabetes, asthma, this includes High risk factors for pregnancy, etc.) ---Yes List chronic conditions. ---Malignant melanoma Is the patient pregnant or possibly pregnant? (Ask all females between the ages of 50-55) ---No Is this a behavioral health or substance abuse call? ---No Guidelines Guideline Title Affirmed Question Affirmed Notes Nurse Date/Time (Eastern Time) COVID-19 - Diagnosed or Suspected [1] Fever > 100.0 F (37.8 C) AND [2] bedridden (e.g., nursing home patient, CVA, chronic May, RN, [place] 03/28/2020 11:26:23 AM PLEASE NOTE: All timestamps contained within this report are represented as [place] Standard Time. CONFIDENTIALTY NOTICE: This fax transmission is intended only for the addressee. It contains information that is legally privileged, confidential or otherwise protected from use or disclosure. If you are not the intended recipient, you are strictly prohibited from reviewing, disclosing, copying using or disseminating any of this information or taking any action in reliance on or regarding this information. If you have received this fax in error, please notify us immediately by telephone so that we can arrange for its return to [place]. Phone: [phone], Toll-Free: [phone], Fax: [phone] Page: 2 of 2 Call Id: [id number] Guidelines Guideline Title  Affirmed Question Affirmed Notes Nurse Date/Time Lori Callahan Time) illness, recovering from surgery) Disp. Time Lori Callahan Time) Disposition Final User 03/28/2020 11:18:24 AM Send to Urgent Queue Peggye Fothergill 03/28/2020 11:33:02 AM See HCP within 4 Hours (or PCP triage) Yes May, RN, Tammy Caller Disagree/Comply Comply Caller Understands Yes PreDisposition Did not know what to do Care Advice Given Per Guideline SEE HCP (OR PCP TRIAGE) WITHIN 4 HOURS: * IF OFFICE WILL BE OPEN: You need to be seen within the next 3 or 4 hours. Call your doctor (or NP/PA) now or as soon as the office opens. GENERAL CARE ADVICE FOR COVID-19 SYMPTOMS: * Cough: Use cough drops. * Fever: For fever over 101 F (38.3 C), take acetaminophen every 4 to 6 hours (Adults 650 mg) OR ibuprofen every 6 to 8 hours (Adults 400 mg). Before taking any medicine, read all the instructions on the package. Do not take aspirin unless your doctor has prescribed it for you. CALL BACK IF: * You become worse CARE ADVICE given per COVID-19 - DIAGNOSED OR SUSPECTED (Adult) guideline. Referrals REFERRED TO PCP OFFICE [facility]

## 2020-03-30 ENCOUNTER — Encounter: Payer: Self-pay | Admitting: Physician Assistant

## 2020-03-30 ENCOUNTER — Telehealth (INDEPENDENT_AMBULATORY_CARE_PROVIDER_SITE_OTHER): Payer: 59 | Admitting: Physician Assistant

## 2020-03-30 VITALS — Temp 98.3°F | Ht 65.0 in | Wt 205.0 lb

## 2020-03-30 DIAGNOSIS — U071 COVID-19: Secondary | ICD-10-CM | POA: Diagnosis not present

## 2020-03-30 LAB — NOVEL CORONAVIRUS, NAA: SARS-CoV-2, NAA: DETECTED — AB

## 2020-03-30 LAB — SARS-COV-2, NAA 2 DAY TAT

## 2020-03-30 NOTE — Progress Notes (Signed)
Virtual Visit via Video   I connected with Lori Callahan on 03/30/20 at  4:00 PM EST by a video enabled telemedicine application and verified that I am speaking with the correct person using two identifiers. Location patient: Home Location provider: [facility], Office Persons participating in the virtual visit: [name], [name], [name], LPN   I discussed the limitations of evaluation and management by telemedicine and the availability of in person appointments. The patient expressed understanding and agreed to proceed.  I acted as a Education administrator for [employer], PA-C [employer], LPN   Subjective:   HPI:   Patient is requesting evaluation for possible COVID-19.  Symptom onset: Saturday night  Travel/contacts: Mother in-Law was positive on Monday and daughter tested positive today  Vaccination status: None  Testing results: 1/18 positive  Patiient endorses the following symptoms: Loss of taste and smell, nasal congestion, cough, fever first two days 101-102, none since. Symptoms are improving.  Patient denies the following symptoms: Chest pain, wheezing, severe SOB  Treatments tried: Ibuprofen   Patient risk factors: Current [id number] risk of complications score: 1 Smoking status: Lori Callahan  reports that she quit smoking about 4 months ago. Her smoking use included cigarettes. She has a 5.00 pack-year smoking history. She has never used smokeless tobacco. If female, currently pregnant? []   Yes [x]   No  ROS: See pertinent positives and negatives per HPI.  Patient Active Problem List   Diagnosis Date Noted  . 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 Bipolar 2 disorder ([place]) 05/25/2019  . Anxiety 05/25/2019    Social History   Tobacco Use  . Smoking status: Former Smoker    Packs/day: 0.50    Years: 10.00    Pack years: 5.00    Types: Cigarettes    Quit date: 11/10/2019    Years since quitting: 0.3  . Smokeless  tobacco: Never Used  Substance Use Topics  . Alcohol use: Yes    Comment: 2 shots a month    Current Outpatient Medications:  .  ALPRAZolam (XANAX) 0.5 MG tablet, Take 1 tablet (0.5 mg total) by mouth at bedtime as needed for anxiety., Disp: 30 tablet, Rfl: 1 .  levothyroxine (SYNTHROID) 50 MCG tablet, Take 1 tablet (50 mcg total) by mouth daily before breakfast., Disp: 90 tablet, Rfl: 1 .  silver sulfADIAZINE (SILVADENE) 1 % cream, Apply topically., Disp: , Rfl:  .  gabapentin (NEURONTIN) 100 MG capsule, Take 100 mg by mouth 3 (three) times daily. (Patient not taking: Reported on 03/30/2020), Disp: , Rfl:   Allergies  Allergen Reactions  . Reglan [Metoclopramide] Shortness Of Breath    Objective:   VITALS: Per patient if applicable, see vitals. GENERAL: Alert, appears well and in no acute distress. HEENT: Atraumatic, conjunctiva clear, no obvious abnormalities on inspection of external nose and ears. NECK: Normal movements of the head and neck. CARDIOPULMONARY: No increased WOB. Speaking in clear sentences. I:E ratio WNL.  MS: Moves all visible extremities without noticeable abnormality. PSYCH: Pleasant and cooperative, well-groomed. Speech normal rate and rhythm. Affect is appropriate. Insight and judgement are appropriate. Attention is focused, linear, and appropriate.  NEURO: CN grossly intact. Oriented as arrived to appointment on time with no prompting. Moves both UE equally.  SKIN: No obvious lesions, wounds, erythema, or cyanosis noted on face or hands.  Assessment and Plan:   [name] was seen today for covid positive.  Diagnoses and all orders for this visit:  COVID-19   No red flags on discussion, patient is not in any obvious distress during our visit. Discussed progression of most viral illnesses, and recommended supportive care at this point in time.  Discussed over the counter supportive care options, including Tylenol 500 mg q 8 hours, with recommendations to push  fluids and rest. Reviewed return precautions including new/worsening fever, SOB, new/worsening cough, sudden onset changes of symptoms. Recommended need to self-quarantine and practice social distancing until symptoms resolve. I recommend that patient follow-up if symptoms worsen or persist despite treatment x 7-10 days, sooner if needed.  I discussed the assessment and treatment plan with the patient. The patient was provided an opportunity to ask questions and all were answered. The patient agreed with the plan and demonstrated an understanding of the instructions.   The patient was advised to call back or seek an in-person evaluation if the symptoms worsen or if the condition fails to improve as anticipated.   CMA or LPN served as scribe during this visit. History, Physical, and Plan performed by medical provider. The above documentation has been reviewed and is accurate and complete.  [place], [place] 03/30/2020

## 2020-03-31 ENCOUNTER — Other Ambulatory Visit: Payer: Self-pay | Admitting: Physician Assistant

## 2020-03-31 DIAGNOSIS — U071 COVID-19: Secondary | ICD-10-CM

## 2020-04-10 ENCOUNTER — Emergency Department (HOSPITAL_BASED_OUTPATIENT_CLINIC_OR_DEPARTMENT_OTHER): Payer: 59

## 2020-04-10 ENCOUNTER — Emergency Department (HOSPITAL_BASED_OUTPATIENT_CLINIC_OR_DEPARTMENT_OTHER)
Admission: EM | Admit: 2020-04-10 | Discharge: 2020-04-10 | Disposition: A | Discharge status: Home / Self Care | Payer: 59 | Attending: Emergency Medicine | Admitting: Emergency Medicine

## 2020-04-10 ENCOUNTER — Telehealth: Payer: Self-pay

## 2020-04-10 ENCOUNTER — Other Ambulatory Visit: Payer: Self-pay

## 2020-04-10 ENCOUNTER — Encounter (HOSPITAL_BASED_OUTPATIENT_CLINIC_OR_DEPARTMENT_OTHER): Payer: Self-pay | Admitting: Emergency Medicine

## 2020-04-10 DIAGNOSIS — K5900 Constipation, unspecified: Secondary | ICD-10-CM | POA: Diagnosis not present

## 2020-04-10 DIAGNOSIS — Z87891 Personal history of nicotine dependence: Secondary | ICD-10-CM | POA: Insufficient documentation

## 2020-04-10 DIAGNOSIS — Z79899 Other long term (current) drug therapy: Secondary | ICD-10-CM | POA: Diagnosis not present

## 2020-04-10 DIAGNOSIS — Z85830 Personal history of malignant neoplasm of bone: Secondary | ICD-10-CM | POA: Diagnosis not present

## 2020-04-10 DIAGNOSIS — E039 Hypothyroidism, unspecified: Secondary | ICD-10-CM | POA: Diagnosis not present

## 2020-04-10 DIAGNOSIS — R1013 Epigastric pain: Secondary | ICD-10-CM | POA: Diagnosis present

## 2020-04-10 DIAGNOSIS — R42 Dizziness and giddiness: Secondary | ICD-10-CM | POA: Insufficient documentation

## 2020-04-10 HISTORY — DX: Malignant (primary) neoplasm, unspecified: C80.1

## 2020-04-10 LAB — CBC WITH DIFFERENTIAL/PLATELET
Abs Immature Granulocytes: 0.02 10*3/uL (ref 0.00–0.07)
Basophils Absolute: 0 10*3/uL (ref 0.0–0.1)
Basophils Relative: 0 %
Eosinophils Absolute: 0.1 10*3/uL (ref 0.0–0.5)
Eosinophils Relative: 1 %
HCT: 40.8 % (ref 36.0–46.0)
Hemoglobin: 14.1 g/dL (ref 12.0–15.0)
Immature Granulocytes: 0 %
Lymphocytes Relative: 34 %
Lymphs Abs: 2.6 10*3/uL (ref 0.7–4.0)
MCH: 30.9 pg (ref 26.0–34.0)
MCHC: 34.6 g/dL (ref 30.0–36.0)
MCV: 89.3 fL (ref 80.0–100.0)
Monocytes Absolute: 0.5 10*3/uL (ref 0.1–1.0)
Monocytes Relative: 6 %
Neutro Abs: 4.3 10*3/uL (ref 1.7–7.7)
Neutrophils Relative %: 59 %
Platelets: 275 10*3/uL (ref 150–400)
RBC: 4.57 MIL/uL (ref 3.87–5.11)
RDW: 12.7 % (ref 11.5–15.5)
WBC: 7.5 10*3/uL (ref 4.0–10.5)
nRBC: 0 % (ref 0.0–0.2)

## 2020-04-10 LAB — URINALYSIS, MICROSCOPIC (REFLEX)
RBC / HPF: >50 RBC/hpf (ref 0–5)
WBC, UA: NONE SEEN WBC/hpf (ref 0–5)

## 2020-04-10 LAB — PREGNANCY, URINE: Preg Test, Ur: NEGATIVE

## 2020-04-10 LAB — COMPREHENSIVE METABOLIC PANEL
ALT: 22 U/L (ref 0–44)
AST: 20 U/L (ref 15–41)
Albumin: 4.3 g/dL (ref 3.5–5.0)
Alkaline Phosphatase: 48 U/L (ref 38–126)
Anion gap: 11 (ref 5–15)
BUN: 8 mg/dL (ref 6–20)
CO2: 23 mmol/L (ref 22–32)
Calcium: 8.9 mg/dL (ref 8.9–10.3)
Chloride: 103 mmol/L (ref 98–111)
Creatinine, Ser: 0.67 mg/dL (ref 0.44–1.00)
GFR, Estimated: >60 mL/min (ref >60)
Glucose, Bld: 84 mg/dL (ref 70–99)
Potassium: 3.8 mmol/L (ref 3.5–5.1)
Sodium: 137 mmol/L (ref 135–145)
Total Bilirubin: 0.5 mg/dL (ref 0.3–1.2)
Total Protein: 7.9 g/dL (ref 6.5–8.1)

## 2020-04-10 LAB — URINALYSIS, ROUTINE W REFLEX MICROSCOPIC
Bilirubin Urine: NEGATIVE
Glucose, UA: NEGATIVE mg/dL
Ketones, ur: NEGATIVE mg/dL
Leukocytes,Ua: NEGATIVE
Nitrite: NEGATIVE
Protein, ur: NEGATIVE mg/dL
Specific Gravity, Urine: 1.01 (ref 1.005–1.030)
pH: 6 (ref 5.0–8.0)

## 2020-04-10 LAB — LIPASE, BLOOD: Lipase: 30 U/L (ref 11–51)

## 2020-04-10 MED ORDER — IOHEXOL 300 MG/ML  SOLN
100.0000 mL | Freq: Once | INTRAMUSCULAR | Status: AC | PRN
  Administered 2020-04-10: 100 mL via INTRAVENOUS

## 2020-04-10 NOTE — Telephone Encounter (Signed)
See Triage note 

## 2020-04-10 NOTE — ED Provider Notes (Signed)
[facility] EMERGENCY DEPARTMENT Provider Note   CSN: [id number] Arrival date & time: 04/10/20  1018     History Chief Complaint  Patient presents with  . Constipation    Lori Callahan is a 35 y.o. female.  She recently diagnosed positive for COVID.  She has a lesion on her foot diagnosed as melanoma and is supposed to have an excision and lymph node biopsy.  She was unable to do that secondary to the Covid diagnosis.  She said for 2 weeks she has been having increased constipation.  More recently associated with some upper abdominal discomfort.  Milk of magnesia works but she only took it twice.  Today she is feeling dizzy and lightheaded.  She is very concerned that she has metastatic spread of her cancer.  No fevers or chills no cough no chest pain no vomiting no urinary symptoms.  Last menstrual period is yesterday  The history is provided by the patient.  Abdominal Pain Pain location:  Epigastric Pain quality: cramping   Pain radiates to:  Does not radiate Pain severity:  Moderate Onset quality:  Gradual Timing:  Intermittent Progression:  Unchanged Chronicity:  New Context: recent illness   Context: not diet changes and not trauma   Relieved by:  None tried Worsened by:  Nothing Ineffective treatments:  None tried Associated symptoms: constipation   Associated symptoms: no chest pain, no chills, no cough, no diarrhea, no dysuria, no fever, no hematemesis, no hematochezia, no hematuria, no melena, no shortness of breath, no sore throat and no vomiting   Dizziness Quality:  Lightheadedness Severity:  Moderate Onset quality:  Gradual Timing:  Sporadic Progression:  Unchanged Chronicity:  New Relieved by:  None tried Worsened by:  Nothing Ineffective treatments:  None tried Associated symptoms: no chest pain, no diarrhea, no shortness of breath, no syncope and no vomiting        Past Medical History:  Diagnosis Date  . ADHD (attention deficit  hyperactivity disorder)   . Anxiety   . Bipolar disorder ([place])   . Cancer ([place])   . Depression   . Herpes genitalia   . HPV (human papilloma virus) infection   . Hypothyroidism     Patient Active Problem List   Diagnosis Date Noted  . 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 Bipolar 2 disorder ([place]) 05/25/2019  . Anxiety 05/25/2019    Past Surgical History:  Procedure Laterality Date  . CESAREAN SECTION    . CESAREAN SECTION  03/02/2012   Procedure: CESAREAN SECTION;  Surgeon: Sharene Butters, MD;  Location: [facility];  Service: Obstetrics;  Laterality: N/A;  . CESAREAN SECTION N/A 10/22/2016   Procedure: CESAREAN SECTION;  Surgeon: Janyth Pupa, DO;  Location: [facility];  Service: Obstetrics;  Laterality: N/A;  . TONSILLECTOMY AND ADENOIDECTOMY    . TUBAL LIGATION Bilateral 10/22/2016   Procedure: POST PARTUM TUBAL LIGATION;  Surgeon: Janyth Pupa, DO;  Location: [facility];  Service: Obstetrics;  Laterality: Bilateral;     OB History    Gravida  3   Para  3   Term  3   Preterm      AB      Living  3     SAB      IAB      Ectopic      Multiple  0   Live Births  3           Family History  Problem  Relation Age of Onset  . Breast cancer Maternal Grandmother        x2  . Leukemia Maternal Grandmother   . Skin cancer Mother     Social History   Tobacco Use  . Smoking status: Former Smoker    Packs/day: 0.50    Years: 10.00    Pack years: 5.00    Types: Cigarettes    Quit date: 11/10/2019    Years since quitting: 0.4  . Smokeless tobacco: Never Used  Vaping Use  . Vaping Use: Every day  . Start date: 11/10/2019  . Substances: Nicotine  Substance Use Topics  . Alcohol use: Yes    Comment: 2 shots a month  . Drug use: Yes    Types: Marijuana    Comment: nightly    Home Medications Prior to Admission medications   Medication Sig Start Date End Date Taking? Authorizing Provider  ALPRAZolam Duanne Moron) 0.5 MG  tablet Take 1 tablet (0.5 mg total) by mouth at bedtime as needed for anxiety. 03/24/20   Inda Coke, PA  gabapentin (NEURONTIN) 100 MG capsule Take 100 mg by mouth 3 (three) times daily. Patient not taking: Reported on 03/30/2020 03/29/20   [provider]  levothyroxine (SYNTHROID) 50 MCG tablet Take 1 tablet (50 mcg total) by mouth daily before breakfast. 03/17/20   Inda Coke, PA  silver sulfADIAZINE (SILVADENE) 1 % cream Apply topically. 03/25/20   [provider]  traZODone (DESYREL) 50 MG tablet Take 1 tablet (50 mg total) by mouth at bedtime as needed for sleep. 05/25/19 07/02/19  Nevada Crane, MD    Allergies    Reglan [metoclopramide]  Review of Systems   Review of Systems  Constitutional: Negative for chills and fever.  HENT: Negative for sore throat.   Eyes: Negative for visual disturbance.  Respiratory: Negative for cough and shortness of breath.   Cardiovascular: Negative for chest pain and syncope.  Gastrointestinal: Positive for abdominal pain and constipation. Negative for diarrhea, hematemesis, hematochezia, melena and vomiting.  Genitourinary: Negative for dysuria and hematuria.  Musculoskeletal: Negative for myalgias.  Skin: Negative for rash.  Neurological: Positive for dizziness.    Physical Exam Updated Vital Signs BP (!) 125/95 (BP Location: Right Arm)   Pulse 88   Temp 98 F (36.7 C) (Oral)   Resp 16   Ht 5\' 5"  (1.651 m)   Wt 93.9 kg   LMP 03/12/2020   SpO2 100%   BMI 34.45 kg/m   Physical Exam Vitals and nursing note reviewed.  Constitutional:      General: She is not in acute distress.    Appearance: Normal appearance. She is well-developed and well-nourished.  HENT:     Head: Normocephalic and atraumatic.  Eyes:     Conjunctiva/sclera: Conjunctivae normal.  Cardiovascular:     Rate and Rhythm: Normal rate and regular rhythm.     Heart sounds: No murmur heard.   Pulmonary:     Effort: Pulmonary effort is normal.  No respiratory distress.     Breath sounds: Normal breath sounds.  Abdominal:     Palpations: Abdomen is soft.     Tenderness: There is no abdominal tenderness. There is no guarding or rebound.  Musculoskeletal:        General: No deformity, signs of injury or edema. Normal range of motion.     Cervical back: Neck supple.  Skin:    General: Skin is warm and dry.     Capillary Refill: Capillary refill takes less  than 2 seconds.  Neurological:     General: No focal deficit present.     Mental Status: She is alert and oriented to person, place, and time.     Cranial Nerves: No cranial nerve deficit.     Sensory: No sensory deficit.     Motor: No weakness.     Gait: Gait normal.  Psychiatric:        Mood and Affect: Mood and affect normal.     ED Results / Procedures / Treatments   Labs (all labs ordered are listed, but only abnormal results are displayed) Labs Reviewed  URINALYSIS, ROUTINE W REFLEX MICROSCOPIC - Abnormal; Notable for the following components:      Result Value   APPearance HAZY (*)    Hgb urine dipstick LARGE (*)    All other components within normal limits  URINALYSIS, MICROSCOPIC (REFLEX) - Abnormal; Notable for the following components:   Bacteria, UA RARE (*)    All other components within normal limits  COMPREHENSIVE METABOLIC PANEL  CBC WITH DIFFERENTIAL/PLATELET  LIPASE, BLOOD  PREGNANCY, URINE    EKG EKG Interpretation  Date/Time:  Monday April 10 2020 12:40:07 EST Ventricular Rate:  87 PR Interval:    QRS Duration: 87 QT Interval:  401 QTC Calculation: 483 R Axis:   68 Text Interpretation: Sinus rhythm Borderline prolonged QT interval No old tracing to compare Confirmed by Aletta Edouard [phone]) on 04/10/2020 12:44:14 PM   Radiology CT Head Wo Contrast  Result Date: 04/10/2020 CLINICAL DATA:  Dizziness EXAM: CT HEAD WITHOUT CONTRAST TECHNIQUE: Contiguous axial images were obtained from the base of the skull through the vertex without  intravenous contrast. COMPARISON:  None. FINDINGS: Brain: No evidence of acute infarction, hemorrhage, hydrocephalus, extra-axial collection or mass lesion/mass effect. Vascular: No hyperdense vessel or unexpected calcification. Skull: Normal. Negative for fracture or focal lesion. Sinuses/Orbits: No acute finding. Other: None. IMPRESSION: No acute intracranial findings. Electronically Signed   By: Davina Poke D.O.   On: 04/10/2020 13:07   CT Abdomen Pelvis W Contrast  Result Date: 04/10/2020 CLINICAL DATA:  Constipation. Abdominal pain. Recently diagnosed with melanoma. COVID-19 diagnosis on January 15th. EXAM: CT ABDOMEN AND PELVIS WITH CONTRAST TECHNIQUE: Multidetector CT imaging of the abdomen and pelvis was performed using the standard protocol following bolus administration of intravenous contrast. CONTRAST:  185mL OMNIPAQUE IOHEXOL 300 MG/ML  SOLN COMPARISON:  10/19/2008 abdominal ultrasound from [facility]. 01/10/2006 abdominopelvic CT report only FINDINGS: Lower chest: Clear lung bases. Normal heart size without pericardial or pleural effusion. Hepatobiliary: Normal liver. Normal gallbladder, without biliary ductal dilatation. Pancreas: Normal, without mass or ductal dilatation. Spleen: Normal in size, without focal abnormality. Adrenals/Urinary Tract: Normal adrenal glands. Normal kidneys, without hydronephrosis. Normal urinary bladder. Stomach/Bowel: Normal stomach, without wall thickening. Normal colon, appendix, and terminal ileum. Normal small bowel. Vascular/Lymphatic: Aortic atherosclerosis. No abdominopelvic adenopathy. Reproductive: Normal uterus and adnexa. Other: No significant free fluid. No free intraperitoneal air. No evidence of omental or peritoneal disease. Musculoskeletal: Tiny sclerotic lesion in the left iliac is likely a bone island. IMPRESSION: No acute process or evidence of metastatic disease in the abdomen or pelvis. No explanation for patient's symptoms. Aortic  Atherosclerosis (ICD10-I70.0).  This is age advanced. Electronically Signed   By: Abigail Miyamoto M.D.   On: 04/10/2020 13:12    Procedures Procedures   Medications Ordered in ED Medications  iohexol (OMNIPAQUE) 300 MG/ML solution 100 mL (100 mLs Intravenous Contrast Given 04/10/20 1247)    ED Course  I have reviewed the triage vital signs and the nursing notes.  Pertinent labs & imaging results that were available during my care of the patient were reviewed by me and considered in my medical decision making (see chart for details).  Clinical Course as of 04/10/20 1804  Mon Apr 10, 2020  1236 Patient's urinalysis with greater than 50 reds, she said she is on her period right now. [MB]  1340 Reviewed results with patient.  She is comfortable just using some over-the-counter laxatives.  Recommended close follow-up with her doctors.  Return instructions discussed [MB]    Clinical Course User Index [MB] Hayden Rasmussen, MD   MDM Rules/Calculators/A&P                         This patient complains of constipation, upper abdominal discomfort, lightheadedness dizziness; this involves an extensive number of treatment Options and is a complaint that carries with it a high risk of complications and Morbidity. The differential includes constipation, colitis, diverticulitis, metastasis, dehydration, arrhythmia, metabolic derangement  I ordered, reviewed and interpreted labs, which included CBC with normal white count normal hemoglobin, chemistries and LFTs normal, urinalysis with greater than 50 reds in the setting of having her. I ordered imaging studies which included CT head and CT abdomen pelvis with contrast and I independently    visualized and interpreted imaging which showed no acute findings Previous records obtained and reviewed in epic, no recent admissions  After the interventions stated above, I reevaluated the patient and found patient to be hemodynamically stable.  Reviewed her  work-up with her.  She is comfortable plan for outpatient management of her symptoms.  Return instructions discussed   Final Clinical Impression(s) / ED Diagnoses Final diagnoses:  Constipation, unspecified constipation type  Lightheadedness    Rx / DC Orders ED Discharge Orders    None       Hayden Rasmussen, MD 04/10/20 1807

## 2020-04-10 NOTE — Telephone Encounter (Signed)
Nurse Assessment Nurse: Mancel Bale, RN, Butch Penny Date/Time Eilene Ghazi Time): 04/10/2020 9:45:00 AM Confirm and document reason for call. If symptomatic, describe symptoms. ---Caller states she is having symptoms of dizziness. In addition she states she has had symptoms of constipation and abdominal pain. Recent COVID diagnosis but feels like these symptoms started before COVID. Does the patient have any new or worsening symptoms? ---Yes Will a triage be completed? ---Yes Related visit to physician within the last 2 weeks? ---Yes Does the PT have any chronic conditions? (i.e. diabetes, asthma, this includes High risk factors for pregnancy, etc.) ---Yes List chronic conditions. ---Melanoma, Hypothyroidism Is the patient pregnant or possibly pregnant? (Ask all females between the ages of 60-55) ---No Is this a behavioral health or substance abuse call? ---No PLEASE NOTE: All timestamps contained within this report are represented as [place] Standard Time. CONFIDENTIALTY NOTICE: This fax transmission is intended only for the addressee. It contains information that is legally privileged, confidential or otherwise protected from use or disclosure. If you are not the intended recipient, you are strictly prohibited from reviewing, disclosing, copying using or disseminating any of this information or taking any action in reliance on or regarding this information. If you have received this fax in error, please notify us immediately by telephone so that we can arrange for its return to [place]. Phone: [phone], Toll-Free: [phone], Fax: [phone] Page: 2 of 2 Call Id: [id number] Guidelines Guideline Title Affirmed Question Affirmed Notes Nurse Date/Time Eilene Ghazi Time) Dizziness - Lightheadedness SEVERE dizziness (e.g., unable to stand, requires support to walk, feels like passing out now) Mancel Bale, Financial planner 04/10/2020 9:48:06 AM Disp. Time Eilene Ghazi Time) Disposition Final User 04/10/2020 9:54:02 AM  Go to ED Now Yes Mancel Bale, RN, Butch Penny Disposition Overriden: Go to ED Now (or PCP triage) Override Reason: Patient's symptoms need a higher level of care Caller Disagree/Comply Comply Caller Understands Yes PreDisposition Did not know what to do Care Advice Given Per Guideline GO TO ED NOW: * You need to be seen in the Emergency Department. * Go to the ED at ___________ Hospital. NOTE TO TRIAGER - DRIVING: * Leave now. Drive carefully. * Another adult should drive. * Patient should not delay going to the emergency department. * If immediate transportation is not available via car, rideshare (e.g., Lyft, Uber), or taxi, then the patient should be instructed to call [id number]. BRING MEDICINES: * Bring a list of your current medicines when you go to the Emergency Department (ER). * Bring the pill bottles too. This will help the doctor (or NP/PA) to make certain you are taking the right medicines and the right dose. Comments User: Marijo Conception, RN Date/Time Eilene Ghazi Time): 04/10/2020 9:54:47 AM Caller states she has felt severely dizzy this morning after trying to have a BM and she is too dizzy to try and stand up straight. Referrals GO TO FACILITY OTHER - SPECIF

## 2020-04-10 NOTE — ED Triage Notes (Addendum)
For several months has had issues with constipation  Just dx with  Skin cancer has been dizzy dx with covid on jan 15, has only taken any OTC  meds for the constipation   x2 2 weeks ago

## 2020-04-10 NOTE — Discharge Instructions (Signed)
You were seen in the emergency department for evaluation of 2 weeks of constipation along with 1 day of lightheadedness.  You had blood work and a CAT scan of your head along with a CAT scan of your abdomen and pelvis that did not show any significant abnormalities.  Please increase your fiber intake and fluid intake.  Follow-up with your regular doctor.  Over-the-counter laxatives may also help.  Return if any worsening or concerning symptoms

## 2020-04-11 ENCOUNTER — Encounter: Payer: Self-pay | Admitting: Physician Assistant

## 2020-04-11 ENCOUNTER — Other Ambulatory Visit: Payer: Self-pay | Admitting: Physician Assistant

## 2020-04-11 ENCOUNTER — Ambulatory Visit (INDEPENDENT_AMBULATORY_CARE_PROVIDER_SITE_OTHER): Payer: 59

## 2020-04-11 ENCOUNTER — Ambulatory Visit (INDEPENDENT_AMBULATORY_CARE_PROVIDER_SITE_OTHER): Payer: 59 | Admitting: Physician Assistant

## 2020-04-11 VITALS — BP 102/70 | HR 104 | Temp 97.8°F | Ht 65.0 in | Wt 206.0 lb

## 2020-04-11 DIAGNOSIS — I700 Atherosclerosis of aorta: Secondary | ICD-10-CM

## 2020-04-11 DIAGNOSIS — C801 Malignant (primary) neoplasm, unspecified: Secondary | ICD-10-CM

## 2020-04-11 DIAGNOSIS — N9089 Other specified noninflammatory disorders of vulva and perineum: Secondary | ICD-10-CM | POA: Diagnosis not present

## 2020-04-11 DIAGNOSIS — R002 Palpitations: Secondary | ICD-10-CM

## 2020-04-11 DIAGNOSIS — C439 Malignant melanoma of skin, unspecified: Secondary | ICD-10-CM

## 2020-04-11 HISTORY — DX: Malignant (primary) neoplasm, unspecified: C80.1

## 2020-04-11 HISTORY — DX: Malignant melanoma of skin, unspecified: C43.9

## 2020-04-11 HISTORY — PX: OTHER SURGICAL HISTORY: SHX169

## 2020-04-11 MED ORDER — VALACYCLOVIR HCL 1 G PO TABS: 1000.0000 mg | ORAL_TABLET | Freq: Two times a day (BID) | ORAL | 0 refills | Status: DC

## 2020-04-11 NOTE — Patient Instructions (Signed)
It was great to see you!  Suspect possible herpes virus outbreak. Start valtrex 1 gram twice daily for 7 - 10 days.  I will be in touch with swab results.  Call me if worsening/concerns.  Take care,  Inda Coke PA-C

## 2020-04-11 NOTE — Progress Notes (Signed)
Lori Callahan is a 35 y.o. female is here for follow up.  I acted as a Education administrator for [employer], PA-C Lori Pickler, LPN   History of Present Illness:   Chief Complaint  Patient presents with  . Abscess    HPI   Labial lesions Pt c/o painful lesions on left labia x 3 years, come and goes worse when she has her period due to pad rubbing area. Pt says the area now is inflamed and hurting. She is having some nausea and dizziness, thinks infected.  Has trialed topical neosporin.  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 Never confirmed and never on medications for this. Denies husband with outbreaks.   Aortic atherosclerosis Incidental finding on her abdominal CT. She denies any strong family hx of HLD, HTN or CHD.   Palpitations Having issues with intermittent palpitations. Happens a few times a week. Denies chest pain or SOB with these episodes. Consistently happens with stress. She is currently under a lot of stress right now due to ongoing melanoma work-up.    Health Maintenance Due  Topic Date Due  . Hepatitis C Screening  Never done  . COVID-19 Vaccine (1) Never done  . PAP SMEAR-Modifier  04/05/2019    Past Medical History:  Diagnosis Date  . ADHD (attention deficit hyperactivity disorder)   . Anxiety   . Bipolar disorder ([place])   . Cancer ([place])   . Depression   . Herpes genitalia   . HPV (human papilloma virus) infection   . Hypothyroidism      Social History   Tobacco Use  . Smoking status: Former Smoker    Packs/day: 0.50    Years: 10.00    Pack years: 5.00    Types: Cigarettes    Quit date: 11/10/2019    Years since quitting: 0.4  . Smokeless tobacco: Never Used  Vaping Use  . Vaping Use: Every day  . Start date: 11/10/2019  . Substances: Nicotine  Substance Use Topics  . Alcohol use: Yes    Comment: 2 shots a month  . Drug use: Yes    Types: Marijuana    Comment: nightly    Past Surgical History:  Procedure Laterality Date  .  CESAREAN SECTION    . CESAREAN SECTION  03/02/2012   Procedure: CESAREAN SECTION;  Surgeon: Lori Butters, MD;  Location: [facility];  Service: Obstetrics;  Laterality: N/A;  . CESAREAN SECTION N/A 10/22/2016   Procedure: CESAREAN SECTION;  Surgeon: Lori Pupa, DO;  Location: [facility];  Service: Obstetrics;  Laterality: N/A;  . TONSILLECTOMY AND ADENOIDECTOMY    . TUBAL LIGATION Bilateral 10/22/2016   Procedure: POST PARTUM TUBAL LIGATION;  Surgeon: Lori Pupa, DO;  Location: [facility];  Service: Obstetrics;  Laterality: Bilateral;    Family History  Problem Relation Age of Onset  . Breast cancer Maternal Grandmother        x2  . Leukemia Maternal Grandmother   . Skin cancer Mother     PMHx, SurgHx, SocialHx, FamHx, Medications, and Allergies were reviewed in the Visit Navigator and updated as appropriate.   Patient Active Problem List   Diagnosis Date Noted  . Aortic atherosclerosis ([place]) 04/11/2020  . 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 Bipolar 2 disorder ([place]) 05/25/2019  . Anxiety 05/25/2019    Social History   Tobacco Use  . Smoking status: Former Smoker    Packs/day: 0.50    Years: 10.00  Pack years: 5.00    Types: Cigarettes    Quit date: 11/10/2019    Years since quitting: 0.4  . Smokeless tobacco: Never Used  Vaping Use  . Vaping Use: Every day  . Start date: 11/10/2019  . Substances: Nicotine  Substance Use Topics  . Alcohol use: Yes    Comment: 2 shots a month  . Drug use: Yes    Types: Marijuana    Comment: nightly    Current Medications and Allergies:    Current Outpatient Medications:  .  ALPRAZolam (XANAX) 0.5 MG tablet, Take 1 tablet (0.5 mg total) by mouth at bedtime as needed for anxiety., Disp: 30 tablet, Rfl: 1 .  levothyroxine (SYNTHROID) 50 MCG tablet, Take 1 tablet (50 mcg total) by mouth daily before breakfast., Disp: 90 tablet, Rfl: 1 .  valACYclovir (VALTREX) 1000 MG tablet, Take 1 tablet (1,000  mg total) by mouth 2 (two) times daily., Disp: 20 tablet, Rfl: 0 .  lidocaine-prilocaine (EMLA) cream, Apply to affected area 2 hours prior to procedure. (Patient not taking: Reported on 04/11/2020), Disp: , Rfl:    Allergies  Allergen Reactions  . Reglan [Metoclopramide] Shortness Of Breath    Review of Systems   ROS  Negative unless otherwise specified per HPI.  Vitals:   Vitals:   04/11/20 0909  BP: 102/70  Pulse: (!) 104  Temp: 97.8 F (36.6 C)  TempSrc: Temporal  SpO2: 96%  Weight: 206 lb (93.4 kg)  Height: 5\' 5"  (1.651 m)     Body mass index is 34.28 kg/m.   Physical Exam:    Physical Exam Vitals and nursing note reviewed.  Constitutional:      General: She is not in acute distress.    Appearance: She is well-developed. She is not ill-appearing, toxic-appearing or sickly-appearing.  Cardiovascular:     Rate and Rhythm: Normal rate and regular rhythm.     Pulses: Normal pulses.     Heart sounds: Normal heart sounds, S1 normal and S2 normal.     Comments: No LE edema Pulmonary:     Effort: Pulmonary effort is normal.     Breath sounds: Normal breath sounds.  Genitourinary:    Comments: 3 x clear vesicles with erythematous base to L labia; clear discharge from areas Skin:    General: Skin is warm, dry and intact.  Neurological:     Mental Status: She is alert.     GCS: GCS eye subscore is 4. GCS verbal subscore is 5. GCS motor subscore is 6.  Psychiatric:        Attention and Perception: Attention normal.        Mood and Affect: Mood is anxious.        Speech: Speech normal.        Behavior: Behavior normal. Behavior is cooperative.      Assessment and Plan:    Lori Callahan was seen today for abscess.  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 Start valtrex 1 g BID x 7-10 days. Has new appointment with Ob next week. -     Herpes simplex virus culture  Aortic atherosclerosis ([facility]) Denies strong  family hx of CAD. Will not start statin at this time. Plan to order calcium scoring at some point this year (she is trying to currently get through her melanoma surgery and feels overwhelmed with this.)  Palpitations No red flags on discussion. Declines work-up today. I would like to pursue zio  patch if persists, she will let us know. Worsening precautions advised.  Other orders -     valACYclovir (VALTREX) 1000 MG tablet; Take 1 tablet (1,000 mg total) by mouth 2 (two) times daily.   CMA or LPN served as scribe during this visit. History, Physical, and Plan performed by medical provider. The above documentation has been reviewed and is accurate and complete.  Inda Coke, PA-C [place], [place] 04/11/2020  Follow-up: No follow-ups on file.

## 2020-04-13 ENCOUNTER — Encounter: Payer: Self-pay | Admitting: Physician Assistant

## 2020-04-13 LAB — HERPES SIMPLEX VIRUS CULTURE
MICRO NUMBER:: 11481077
SPECIMEN QUALITY:: ADEQUATE

## 2020-04-14 ENCOUNTER — Other Ambulatory Visit: Payer: Self-pay | Admitting: Physician Assistant

## 2020-04-14 ENCOUNTER — Encounter: Payer: Self-pay | Admitting: Physician Assistant

## 2020-04-14 MED ORDER — DOXYCYCLINE HYCLATE 100 MG PO TABS
100.0000 mg | ORAL_TABLET | Freq: Two times a day (BID) | ORAL | 0 refills | Status: DC
Start: 2020-04-14 — End: 2020-06-06

## 2020-04-19 ENCOUNTER — Encounter: Payer: Self-pay | Admitting: Physician Assistant

## 2020-04-21 ENCOUNTER — Other Ambulatory Visit (HOSPITAL_COMMUNITY)
Admission: RE | Admit: 2020-04-21 | Discharge: 2020-04-21 | Disposition: A | Discharge status: Home / Self Care | Payer: 59 | Source: Ambulatory Visit | Attending: Nurse Practitioner | Admitting: Nurse Practitioner

## 2020-04-21 ENCOUNTER — Ambulatory Visit (INDEPENDENT_AMBULATORY_CARE_PROVIDER_SITE_OTHER): Payer: Commercial Managed Care - PPO | Admitting: Nurse Practitioner

## 2020-04-21 ENCOUNTER — Encounter: Payer: Self-pay | Admitting: Nurse Practitioner

## 2020-04-21 ENCOUNTER — Other Ambulatory Visit: Payer: Self-pay

## 2020-04-21 VITALS — BP 114/64 | HR 68 | Resp 16 | Ht 64.25 in | Wt 206.0 lb

## 2020-04-21 DIAGNOSIS — Z01419 Encounter for gynecological examination (general) (routine) without abnormal findings: Secondary | ICD-10-CM | POA: Diagnosis not present

## 2020-04-21 DIAGNOSIS — N946 Dysmenorrhea, unspecified: Secondary | ICD-10-CM | POA: Diagnosis not present

## 2020-04-21 DIAGNOSIS — R922 Inconclusive mammogram: Secondary | ICD-10-CM

## 2020-04-21 DIAGNOSIS — I7090 Unspecified atherosclerosis: Secondary | ICD-10-CM

## 2020-04-21 DIAGNOSIS — N644 Mastodynia: Secondary | ICD-10-CM

## 2020-04-21 LAB — LIPID PANEL
Cholesterol: 162 mg/dL (ref <200)
HDL: 51 mg/dL (ref >=50)
LDL Cholesterol (Calc): 85 mg/dL (calc)
Non-HDL Cholesterol (Calc): 111 mg/dL (calc) (ref <130)
Total CHOL/HDL Ratio: 3.2 (calc) (ref <5.0)
Triglycerides: 160 mg/dL — ABNORMAL HIGH (ref <150)

## 2020-04-21 MED ORDER — NAPROXEN SODIUM 550 MG PO TABS: 550.0000 mg | ORAL_TABLET | Freq: Two times a day (BID) | ORAL | 2 refills | Status: DC

## 2020-04-21 NOTE — Patient Instructions (Signed)
Health Maintenance, Female Adopting a healthy lifestyle and getting preventive care are important in promoting health and wellness. Ask your health care provider about:  The right schedule for you to have regular tests and exams.  Things you can do on your own to prevent diseases and keep yourself healthy. What should I know about diet, weight, and exercise? Eat a healthy diet  Eat a diet that includes plenty of vegetables, fruits, low-fat dairy products, and lean protein.  Do not eat a lot of foods that are high in solid fats, added sugars, or sodium.   Maintain a healthy weight Body mass index (BMI) is used to identify weight problems. It estimates body fat based on height and weight. Your health care provider can help determine your BMI and help you achieve or maintain a healthy weight. Get regular exercise Get regular exercise. This is one of the most important things you can do for your health. Most adults should:  Exercise for at least 150 minutes each week. The exercise should increase your heart rate and make you sweat (moderate-intensity exercise).  Do strengthening exercises at least twice a week. This is in addition to the moderate-intensity exercise.  Spend less time sitting. Even light physical activity can be beneficial. Watch cholesterol and blood lipids Have your blood tested for lipids and cholesterol at 35 years of age, then have this test every 5 years. Have your cholesterol levels checked more often if:  Your lipid or cholesterol levels are high.  You are older than 35 years of age.  You are at high risk for heart disease. What should I know about cancer screening? Depending on your health history and family history, you may need to have cancer screening at various ages. This may include screening for:  Breast cancer.  Cervical cancer.  Colorectal cancer.  Skin cancer.  Lung cancer. What should I know about heart disease, diabetes, and high blood  pressure? Blood pressure and heart disease  High blood pressure causes heart disease and increases the risk of stroke. This is more likely to develop in people who have high blood pressure readings, are of African descent, or are overweight.  Have your blood pressure checked: ? Every 3-5 years if you are 18-39 years of age. ? Every year if you are 40 years old or older. Diabetes Have regular diabetes screenings. This checks your fasting blood sugar level. Have the screening done:  Once every three years after age 40 if you are at a normal weight and have a low risk for diabetes.  More often and at a younger age if you are overweight or have a high risk for diabetes. What should I know about preventing infection? Hepatitis B If you have a higher risk for hepatitis B, you should be screened for this virus. Talk with your health care provider to find out if you are at risk for hepatitis B infection. Hepatitis C Testing is recommended for:  Everyone born from 1945 through 1965.  Anyone with known risk factors for hepatitis C. Sexually transmitted infections (STIs)  Get screened for STIs, including gonorrhea and chlamydia, if: ? You are sexually active and are younger than 35 years of age. ? You are older than 35 years of age and your health care provider tells you that you are at risk for this type of infection. ? Your sexual activity has changed since you were last screened, and you are at increased risk for chlamydia or gonorrhea. Ask your health care provider   if you are at risk.  Ask your health care provider about whether you are at high risk for HIV. Your health care provider may recommend a prescription medicine to help prevent HIV infection. If you choose to take medicine to prevent HIV, you should first get tested for HIV. You should then be tested every 3 months for as long as you are taking the medicine. Pregnancy  If you are about to stop having your period (premenopausal) and  you may become pregnant, seek counseling before you get pregnant.  Take 400 to 800 micrograms (mcg) of folic acid every day if you become pregnant.  Ask for birth control (contraception) if you want to prevent pregnancy. Osteoporosis and menopause Osteoporosis is a disease in which the bones lose minerals and strength with aging. This can result in bone fractures. If you are 65 years old or older, or if you are at risk for osteoporosis and fractures, ask your health care provider if you should:  Be screened for bone loss.  Take a calcium or vitamin D supplement to lower your risk of fractures.  Be given hormone replacement therapy (HRT) to treat symptoms of menopause. Follow these instructions at home: Lifestyle  Do not use any products that contain nicotine or tobacco, such as cigarettes, e-cigarettes, and chewing tobacco. If you need help quitting, ask your health care provider.  Do not use street drugs.  Do not share needles.  Ask your health care provider for help if you need support or information about quitting drugs. Alcohol use  Do not drink alcohol if: ? Your health care provider tells you not to drink. ? You are pregnant, may be pregnant, or are planning to become pregnant.  If you drink alcohol: ? Limit how much you use to 0-1 drink a day. ? Limit intake if you are breastfeeding.  Be aware of how much alcohol is in your drink. In the [place] one drink equals one 12 oz bottle of beer (355 mL), one 5 oz glass of wine (148 mL), or one 1 oz glass of hard liquor (44 mL). General instructions  Schedule regular health, dental, and eye exams.  Stay current with your vaccines.  Tell your health care provider if: ? You often feel depressed. ? You have ever been abused or do not feel safe at home. Summary  Adopting a healthy lifestyle and getting preventive care are important in promoting health and wellness.  Follow your health care provider's instructions about healthy  diet, exercising, and getting tested or screened for diseases.  Follow your health care provider's instructions on monitoring your cholesterol and blood pressure. This information is not intended to replace advice given to you by your health care provider. Make sure you discuss any questions you have with your health care provider. Document Revised: 02/18/2018 Document Reviewed: 02/18/2018 Elsevier Patient Education  2021 Elsevier Inc.  

## 2020-04-21 NOTE — Progress Notes (Signed)
35 y.o. G4P3003 Married White or Caucasian female here for annual exam.     About to have Melanoma surgery and wants to have all health screening. Sometimes has breast pain Left breast.Reports her grandmother had breast cancer twice, first diagnosis in 66's. Inconclusive  Genetic screening. Mother has double mastectomy to prevent.  Sometimes has ovarian pain. Left side, comes/goes x several months. Periods are normal coming every month, heavy flow 3 days, last 6 days total. Takes 800mg  ibuprofen and doesn't help. In the past had Mirena IUD and did not help. Periods have gotten much more painful since last child and BTL, they have always been heavy.  Went to Hospital last month for unrelated problem and learned that she has athersclerosis in aorta. Currrently wearing a heart monitor as a result.  Was diagnosed with Covid 2-3 weeks ago.   Patient's last menstrual period was 04/11/2020 (exact date).          Sexually active: Yes.    The current method of family planning is tubal ligation.    Exercising: No.  exercise Smoker:  no  Health Maintenance: Pap:  04-04-16 neg HPV HR neg History of abnormal Pap:  Yes (pt was unaware, but is in history), nothing serious MMG:  none Colonoscopy:  none BMD:   none TDaP:  2018 Gardasil:   n/a Covid-19: not done Hep C testing: not done Screening Labs:    reports that she has been smoking cigarettes. She has never used smokeless tobacco. She reports current alcohol use. She reports current drug use. Drug: Marijuana.  Past Medical History:  Diagnosis Date  . Abnormal Pap smear of cervix   . ADHD (attention deficit hyperactivity disorder)   . Anxiety   . Bipolar disorder ([place])   . Cancer ([place])    melanoma  . Herpes genitalia   . HPV (human papilloma virus) infection   . Hypothyroidism     Past Surgical History:  Procedure Laterality Date  . CESAREAN SECTION    . CESAREAN SECTION  03/02/2012   Procedure: CESAREAN SECTION;  Surgeon: Sharene Butters, MD;  Location: [facility];  Service: Obstetrics;  Laterality: N/A;  . CESAREAN SECTION N/A 10/22/2016   Procedure: CESAREAN SECTION;  Surgeon: Janyth Pupa, DO;  Location: [facility];  Service: Obstetrics;  Laterality: N/A;  . TONSILLECTOMY AND ADENOIDECTOMY    . TUBAL LIGATION Bilateral 10/22/2016   Procedure: POST PARTUM TUBAL LIGATION;  Surgeon: Janyth Pupa, DO;  Location: [facility];  Service: Obstetrics;  Laterality: Bilateral;    Current Outpatient Medications  Medication Sig Dispense Refill  . ALPRAZolam (XANAX) 0.5 MG tablet Take 1 tablet (0.5 mg total) by mouth at bedtime as needed for anxiety. 30 tablet 1  . doxycycline (VIBRA-TABS) 100 MG tablet Take 1 tablet (100 mg total) by mouth 2 (two) times daily. 20 tablet 0  . levothyroxine (SYNTHROID) 50 MCG tablet Take 1 tablet (50 mcg total) by mouth daily before breakfast. 90 tablet 1  . lidocaine-prilocaine (EMLA) cream Apply to affected area 2 hours prior to procedure. (Patient not taking: No sig reported)     No current facility-administered medications for this visit.    Family History  Problem Relation Age of Onset  . Breast cancer Maternal Grandmother        x2  . Leukemia Maternal Grandmother   . Skin cancer Mother        basal cell    Review of Systems  Constitutional: Negative.   HENT: Negative.  Eyes: Negative.   Respiratory: Negative.   Cardiovascular: Negative.   Gastrointestinal: Negative.   Endocrine: Negative.   Genitourinary: Negative.   Musculoskeletal: Negative.   Skin:       occ breast pain & ovarian pain, abscess being treated  Allergic/Immunologic: Negative.   Neurological: Negative.   Hematological: Negative.   Psychiatric/Behavioral: Negative.     Exam:   BP 114/64   Pulse 68   Resp 16   Ht 5' 4.25" (1.632 m)   Wt 206 lb (93.4 kg)   LMP 04/11/2020 (Exact Date)   BMI 35.09 kg/m   Height: 5' 4.25" (163.2 cm)  General appearance: alert, cooperative and appears  stated age, no acute distress Head: Normocephalic, without obvious abnormality Neck: no adenopathy, thyroid normal to inspection and palpation Lungs: clear to auscultation bilaterally Breasts: Left breast with dense tissue in upper outer quadrant 2 o'clock, 5 cm from areola Heart: regular rate and rhythm Abdomen: soft, non-tender; no masses,  no organomegaly Extremities: extremities normal, no edema Skin: No rashes or lesions Lymph nodes: Cervical, supraclavicular, and axillary nodes normal. No abnormal inguinal nodes palpated Neurologic: Grossly normal   Pelvic: External genitalia:  no lesions              Urethra:  normal appearing urethra with no masses, tenderness or lesions              Bartholins and Skenes: normal                 Vagina: normal appearing vagina, appropriate for age, normal appearing discharge, no lesions              Cervix: neg cervical motion tenderness, no visible lesions             Bimanual Exam:   Uterus:  normal size, contour, position, consistency, mobility, non-tender              Adnexa: no mass, fullness, tenderness                 Joy, CMA Chaperone was present for exam.  A/P:  Well woman exam with routine gynecological exam - Plan: Cytology - PAP( [facility]). Pt having surgery for melanoma later this month. Wants all cancer screenings.   Dysmenorrhea - Plan: naproxen sodium (ANAPROX DS) 550 MG tablet, US PELVIS TRANSVAGINAL NON-OB (TV ONLY). Suspect Adenomyosis. Hx 3 C-sections. May need to develop management plan after melanoma surgery. Pt does not want OCP, reports has tried Mirena and failed.  Atherosclerosis - Plan: Lipid Profile Learned of this diagnosis incidentally, does not think she has ever had cholesterol checked.  Breast Density and pain- Will schedule Left breast US/diagnostic mammogram

## 2020-04-24 ENCOUNTER — Telehealth: Payer: Self-pay | Admitting: *Deleted

## 2020-04-24 DIAGNOSIS — N644 Mastodynia: Secondary | ICD-10-CM

## 2020-04-24 NOTE — Telephone Encounter (Signed)
-----   Message from Karma Ganja, NP sent at 04/21/2020  2:59 PM EST ----- This patient has left breast pain Has density in upper outer quadrant of left breast 5 cm from areola about 2 o'clock  Maternal grandmother had breast cancer twice, first time in forties. Was inconclusive if genetic. Mother had double mastectomy to prevent.  Please schedule left breast US and diagnostic mammogram  ## currently has Melanoma, having surgery 05/02/2020

## 2020-04-24 NOTE — Telephone Encounter (Signed)
Printed ADA forms and put in your folder.

## 2020-04-25 LAB — CYTOLOGY - PAP
Comment: NEGATIVE
Diagnosis: NEGATIVE
Diagnosis: REACTIVE
High risk HPV: NEGATIVE

## 2020-04-25 NOTE — Telephone Encounter (Signed)
Left message was calling about papers will send My chart message.

## 2020-04-26 ENCOUNTER — Other Ambulatory Visit: Payer: 59

## 2020-05-02 DIAGNOSIS — D2272 Melanocytic nevi of left lower limb, including hip: Secondary | ICD-10-CM | POA: Insufficient documentation

## 2020-05-27 ENCOUNTER — Encounter: Payer: Self-pay | Admitting: Physician Assistant

## 2020-05-29 ENCOUNTER — Other Ambulatory Visit: Payer: Self-pay | Admitting: Physician Assistant

## 2020-05-29 DIAGNOSIS — R0602 Shortness of breath: Secondary | ICD-10-CM

## 2020-05-29 DIAGNOSIS — I700 Atherosclerosis of aorta: Secondary | ICD-10-CM

## 2020-05-29 MED ORDER — ALPRAZOLAM 0.5 MG PO TABS
0.5000 mg | ORAL_TABLET | Freq: Every evening | ORAL | 1 refills | Status: DC | PRN
Start: 2020-05-29 — End: 2020-10-16

## 2020-05-29 NOTE — Telephone Encounter (Signed)
Pt requesting refill for Xanax. See message.

## 2020-06-02 ENCOUNTER — Ambulatory Visit: Payer: 59 | Admitting: Cardiology

## 2020-06-06 ENCOUNTER — Ambulatory Visit (INDEPENDENT_AMBULATORY_CARE_PROVIDER_SITE_OTHER): Payer: Commercial Managed Care - PPO

## 2020-06-06 ENCOUNTER — Other Ambulatory Visit: Payer: Self-pay

## 2020-06-06 ENCOUNTER — Encounter: Payer: Self-pay | Admitting: Obstetrics and Gynecology

## 2020-06-06 ENCOUNTER — Ambulatory Visit (INDEPENDENT_AMBULATORY_CARE_PROVIDER_SITE_OTHER): Payer: Commercial Managed Care - PPO | Admitting: Obstetrics and Gynecology

## 2020-06-06 VITALS — BP 110/64 | HR 101 | Ht 65.0 in | Wt 218.0 lb

## 2020-06-06 DIAGNOSIS — N946 Dysmenorrhea, unspecified: Secondary | ICD-10-CM | POA: Diagnosis not present

## 2020-06-06 NOTE — Progress Notes (Signed)
GYNECOLOGY  VISIT   HPI: 35 y.o.   Married White or Caucasian Not Hispanic or Latino  female   [phone] with No LMP recorded.   here for an ultrasound for evaluation of severe dysmenorrhea and intermittent left sided "ovarian pain".  Cycles are monthly x 6 days, heavy x 3 days. Cramps are severe, worsened since her last c/s and BTL (3 years ago). 800 mg of ibuprofen doesn't help. Previously tried a mirena IUD without help.  She works from home.  She has trouble functioning with her cycles secondary to the pain.  She can saturate a pad in up to 2 hours, not a change. Not anemic. Hypothyroid, on medication. No break through bleeding.   She just had surgery last month for a malignant melanoma of her left foot. Lymph node biopsies were negative.  04/21/20: Pap negative, negative hpv  GYNECOLOGIC HISTORY: No LMP recorded. Contraception:tubal ligation  Menopausal hormone therapy: none         OB History    Gravida  3   Para  3   Term  3   Preterm      AB      Living  3     SAB      IAB      Ectopic      Multiple  0   Live Births  3              Patient Active Problem List   Diagnosis Date Noted  . Aortic atherosclerosis ([place]) 04/11/2020  . 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 Bipolar 2 disorder ([place]) 05/25/2019  . Anxiety 05/25/2019    Past Medical History:  Diagnosis Date  . Abnormal Pap smear of cervix   . ADHD (attention deficit hyperactivity disorder)   . Anxiety   . Bipolar disorder ([place])   . Cancer ([place])    melanoma  . Herpes genitalia   . HPV (human papilloma virus) infection   . Hypothyroidism   H/Omigraine, no aura.  Past Surgical History:  Procedure Laterality Date  . CESAREAN SECTION    . CESAREAN SECTION  03/02/2012   Procedure: CESAREAN SECTION;  Surgeon: Sharene Butters, MD;  Location: [facility];  Service: Obstetrics;  Laterality: N/A;  . CESAREAN SECTION N/A 10/22/2016   Procedure: CESAREAN SECTION;  Surgeon: Janyth Pupa, DO;  Location: [facility];  Service: Obstetrics;  Laterality: N/A;  . TONSILLECTOMY AND ADENOIDECTOMY    . TUBAL LIGATION Bilateral 10/22/2016   Procedure: POST PARTUM TUBAL LIGATION;  Surgeon: Janyth Pupa, DO;  Location: [facility];  Service: Obstetrics;  Laterality: Bilateral;    Current Outpatient Medications  Medication Sig Dispense Refill  . ALPRAZolam (XANAX) 0.5 MG tablet Take 1 tablet (0.5 mg total) by mouth at bedtime as needed for anxiety. 30 tablet 1  . doxycycline (VIBRA-TABS) 100 MG tablet Take 1 tablet (100 mg total) by mouth 2 (two) times daily. 20 tablet 0  . levothyroxine (SYNTHROID) 50 MCG tablet Take 1 tablet (50 mcg total) by mouth daily before breakfast. 90 tablet 1  . lidocaine-prilocaine (EMLA) cream Apply to affected area 2 hours prior to procedure. (Patient not taking: No sig reported)    . naproxen sodium (ANAPROX DS) 550 MG tablet Take 1 tablet (550 mg total) by mouth 2 (two) times daily with a meal. Take prn cramping and pain 60 tablet 2   No current facility-administered medications for this visit.     ALLERGIES: Reglan [metoclopramide]  Family History  Problem Relation Age of Onset  . Breast cancer Maternal Grandmother        x2  . Leukemia Maternal Grandmother   . Skin cancer Mother        basal cell    Social History   Socioeconomic History  . Marital status: Married    Spouse name: Lenell Mcconnell  . Number of children: 3  . Years of education: Not on file  . Highest education level: 12th grade  Occupational History  . Occupation: employed  Tobacco Use  . Smoking status: Current Every Day Smoker    Types: Cigarettes    Last attempt to quit: 11/10/2019    Years since quitting: 0.5  . Smokeless tobacco: Never Used  . Tobacco comment: smoked tobacco 11/2019, vapes now  Vaping Use  . Vaping Use: Every day  . Start date: 11/10/2019  . Substances: Nicotine  Substance and Sexual Activity  . Alcohol use: Yes    Comment: 2  shots a month  . Drug use: Yes    Types: Marijuana    Comment: nightly  . Sexual activity: Yes    Birth control/protection: Surgical    Comment: BTL  Other Topics Concern  . Not on file  Social History Narrative   Married   3 children -- 3, 68, 15   Medicare Agent, works from home   Social Determinants of Radio broadcast assistant Strain: Not on [employer] Insecurity: Not on file  Transportation Needs: Not on file  Physical Activity: Not on file  Stress: Not on file  Social Connections: Not on file  Intimate Partner Violence: Not on file    Review of Systems  All other systems reviewed and are negative.   PHYSICAL EXAMINATION:    There were no vitals taken for this visit.    General appearance: alert, cooperative and appears stated age  Pelvic ultrasound  Indications: severe dysmenorrhea  Findings:  Uterus 9.0 x 5.24 x 4.16 cm  Endometrium 10.62 cm  Possible polyp  Left ovary 2.46 x 1.83 x 1.30 cm  Right ovary 3.05 x 2.09 x 2.09 cm  No free fluid   Impression:  Anteverted normal sized uterus Possible endometrial polyp Normal ovaries bilaterally  1. Severe dysmenorrhea -No concerning findings on ultrasound. She has a possible endometrial polyp, no intermenstrual bleeding, no change in menstrual flow, not anemic.  -mirena iud didn't help -Declines OCP's, nurvaring, depo-provera -NSAID's aren't helping -Discussed the option of laparoscopy with treatment of any endometriosis -Discussed the option of TLH/BS -She desires definitive surgery -Discussed total laparoscopic hysterectomy, bilateral salpingectomies, possible treatment of endometriosis and cystoscopy. Reviewed the risks of the procedure, including infection, bleeding, damage to bowel/badder/vessels/ureters. Discussed that her h/o 3 prior C/S's increases her risk of bladder injury.  Discussed post operative recovery. All of her questions were answered -Needs to return for a preop   In addition  to reviewing the ultrasound images with the patient, ~ 20 minutes was spent in total patient care, specifically with reviewing options for management.   CC: Karma Ganja, NP

## 2020-06-08 ENCOUNTER — Other Ambulatory Visit: Payer: Self-pay

## 2020-06-08 ENCOUNTER — Emergency Department (HOSPITAL_BASED_OUTPATIENT_CLINIC_OR_DEPARTMENT_OTHER): Payer: Commercial Managed Care - PPO

## 2020-06-08 ENCOUNTER — Encounter (HOSPITAL_BASED_OUTPATIENT_CLINIC_OR_DEPARTMENT_OTHER): Payer: Self-pay | Admitting: Emergency Medicine

## 2020-06-08 ENCOUNTER — Telehealth: Payer: Self-pay | Admitting: *Deleted

## 2020-06-08 ENCOUNTER — Emergency Department (HOSPITAL_BASED_OUTPATIENT_CLINIC_OR_DEPARTMENT_OTHER)
Admission: EM | Admit: 2020-06-08 | Discharge: 2020-06-09 | Disposition: A | Discharge status: Home / Self Care | Payer: Commercial Managed Care - PPO | Attending: Emergency Medicine | Admitting: Emergency Medicine

## 2020-06-08 DIAGNOSIS — E039 Hypothyroidism, unspecified: Secondary | ICD-10-CM | POA: Diagnosis not present

## 2020-06-08 DIAGNOSIS — L03116 Cellulitis of left lower limb: Secondary | ICD-10-CM | POA: Insufficient documentation

## 2020-06-08 DIAGNOSIS — Z20822 Contact with and (suspected) exposure to covid-19: Secondary | ICD-10-CM | POA: Diagnosis not present

## 2020-06-08 DIAGNOSIS — F17210 Nicotine dependence, cigarettes, uncomplicated: Secondary | ICD-10-CM | POA: Diagnosis not present

## 2020-06-08 DIAGNOSIS — Z859 Personal history of malignant neoplasm, unspecified: Secondary | ICD-10-CM | POA: Diagnosis not present

## 2020-06-08 DIAGNOSIS — T8189XA Other complications of procedures, not elsewhere classified, initial encounter: Secondary | ICD-10-CM | POA: Diagnosis present

## 2020-06-08 DIAGNOSIS — Z79899 Other long term (current) drug therapy: Secondary | ICD-10-CM | POA: Insufficient documentation

## 2020-06-08 DIAGNOSIS — R000 Tachycardia, unspecified: Secondary | ICD-10-CM | POA: Insufficient documentation

## 2020-06-08 DIAGNOSIS — D72829 Elevated white blood cell count, unspecified: Secondary | ICD-10-CM | POA: Insufficient documentation

## 2020-06-08 DIAGNOSIS — L0390 Cellulitis, unspecified: Secondary | ICD-10-CM

## 2020-06-08 DIAGNOSIS — T8149XA Infection following a procedure, other surgical site, initial encounter: Secondary | ICD-10-CM

## 2020-06-08 LAB — CBC WITH DIFFERENTIAL/PLATELET
Abs Immature Granulocytes: 0.03 10*3/uL (ref 0.00–0.07)
Basophils Absolute: 0 10*3/uL (ref 0.0–0.1)
Basophils Relative: 0 %
Eosinophils Absolute: 0.1 10*3/uL (ref 0.0–0.5)
Eosinophils Relative: 0 %
HCT: 37.6 % (ref 36.0–46.0)
Hemoglobin: 13.1 g/dL (ref 12.0–15.0)
Immature Granulocytes: 0 %
Lymphocytes Relative: 16 %
Lymphs Abs: 2 10*3/uL (ref 0.7–4.0)
MCH: 30.8 pg (ref 26.0–34.0)
MCHC: 34.8 g/dL (ref 30.0–36.0)
MCV: 88.3 fL (ref 80.0–100.0)
Monocytes Absolute: 0.6 10*3/uL (ref 0.1–1.0)
Monocytes Relative: 5 %
Neutro Abs: 10.1 10*3/uL — ABNORMAL HIGH (ref 1.7–7.7)
Neutrophils Relative %: 79 %
Platelets: 191 10*3/uL (ref 150–400)
RBC: 4.26 MIL/uL (ref 3.87–5.11)
RDW: 12.9 % (ref 11.5–15.5)
WBC: 12.9 10*3/uL — ABNORMAL HIGH (ref 4.0–10.5)
nRBC: 0 % (ref 0.0–0.2)

## 2020-06-08 LAB — COMPREHENSIVE METABOLIC PANEL
ALT: 28 U/L (ref 0–44)
AST: 33 U/L (ref 15–41)
Albumin: 3.8 g/dL (ref 3.5–5.0)
Alkaline Phosphatase: 57 U/L (ref 38–126)
Anion gap: 10 (ref 5–15)
BUN: 8 mg/dL (ref 6–20)
CO2: 20 mmol/L — ABNORMAL LOW (ref 22–32)
Calcium: 8.8 mg/dL — ABNORMAL LOW (ref 8.9–10.3)
Chloride: 104 mmol/L (ref 98–111)
Creatinine, Ser: 0.68 mg/dL (ref 0.44–1.00)
GFR, Estimated: >60 mL/min (ref >60)
Glucose, Bld: 141 mg/dL — ABNORMAL HIGH (ref 70–99)
Potassium: 3.5 mmol/L (ref 3.5–5.1)
Sodium: 134 mmol/L — ABNORMAL LOW (ref 135–145)
Total Bilirubin: 0.5 mg/dL (ref 0.3–1.2)
Total Protein: 7.4 g/dL (ref 6.5–8.1)

## 2020-06-08 LAB — LACTIC ACID, PLASMA
Lactic Acid, Venous: 2 mmol/L (ref 0.5–1.9)
Lactic Acid, Venous: 0.9 mmol/L (ref 0.5–1.9)

## 2020-06-08 MED ORDER — VANCOMYCIN HCL IN DEXTROSE 1-5 GM/200ML-% IV SOLN
1000.0000 mg | Freq: Once | INTRAVENOUS | Status: AC
  Administered 2020-06-08: 1000 mg via INTRAVENOUS
  Filled 2020-06-08: qty 200

## 2020-06-08 MED ORDER — IOHEXOL 300 MG/ML  SOLN
100.0000 mL | Freq: Once | INTRAMUSCULAR | Status: AC | PRN
  Administered 2020-06-08: 100 mL via INTRAVENOUS

## 2020-06-08 MED ORDER — PIPERACILLIN-TAZOBACTAM 3.375 G IVPB 30 MIN
3.3750 g | Freq: Once | INTRAVENOUS | Status: AC
  Administered 2020-06-08: 3.375 g via INTRAVENOUS
  Filled 2020-06-08: qty 50

## 2020-06-08 MED ORDER — VANCOMYCIN HCL IN DEXTROSE 1-5 GM/200ML-% IV SOLN: 1000.0000 mg | Freq: Two times a day (BID) | INTRAVENOUS | Status: DC

## 2020-06-08 MED ORDER — SODIUM CHLORIDE 0.9 % IV SOLN: INTRAVENOUS | Status: DC | PRN

## 2020-06-08 MED ORDER — SODIUM CHLORIDE 0.9 % IV BOLUS
1000.0000 mL | Freq: Once | INTRAVENOUS | Status: AC
  Administered 2020-06-08: 1000 mL via INTRAVENOUS

## 2020-06-08 MED ORDER — VANCOMYCIN HCL IN DEXTROSE 1-5 GM/200ML-% IV SOLN
1000.0000 mg | Freq: Once | INTRAVENOUS | Status: AC
  Administered 2020-06-09: 1000 mg via INTRAVENOUS
  Filled 2020-06-08: qty 200

## 2020-06-08 MED ORDER — FENTANYL CITRATE (PF) 100 MCG/2ML IJ SOLN
50.0000 ug | Freq: Once | INTRAMUSCULAR | Status: AC
  Administered 2020-06-08: 50 ug via INTRAVENOUS
  Filled 2020-06-08: qty 2

## 2020-06-08 MED ORDER — PIPERACILLIN-TAZOBACTAM 3.375 G IVPB: 3.3750 g | Freq: Three times a day (TID) | INTRAVENOUS | Status: DC

## 2020-06-08 NOTE — ED Provider Notes (Signed)
Lori Callahan   CSN: [id number] Arrival date & time: 06/08/20  2013     History Chief Complaint  Patient presents with  . Post-op Problem    Lori Callahan is a 35 y.o. female.  Presents to ER with concern for postop infection.  Patient reports she has a history of melanoma.  They removed a lymph node from her groin a few weeks ago.  She then developed a seroma, interventional radiology placed drain.  This initially was draining clear fluid however over the past couple days the fluid seem to be slightly more cloudy and she also started having redness around the site as well as some swelling.  Then had a fever to 101 at home.  Had been seen in the radiology clinic earlier today and was given a course of cephalexin but did not have a fever at the time.  She has not started this medication yet.  HPI     Past Medical History:  Diagnosis Date  . Abnormal Pap smear of cervix   . ADHD (attention deficit hyperactivity disorder)   . Anxiety   . Bipolar disorder ([place])   . Cancer ([place])    melanoma  . Herpes genitalia   . HPV (human papilloma virus) infection   . Hypothyroidism     Patient Active Problem List   Diagnosis Date Noted  . Nevus of toe of left foot 05/02/2020  . Aortic atherosclerosis ([place]) 04/11/2020  . 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 Bipolar 2 disorder ([place]) 05/25/2019  . Anxiety 05/25/2019    Past Surgical History:  Procedure Laterality Date  . CESAREAN SECTION    . CESAREAN SECTION  03/02/2012   Procedure: CESAREAN SECTION;  Surgeon: Sharene Butters, MD;  Location: [facility];  Service: Obstetrics;  Laterality: N/A;  . CESAREAN SECTION N/A 10/22/2016   Procedure: CESAREAN SECTION;  Surgeon: Janyth Pupa, DO;  Location: [facility];  Service: Obstetrics;  Laterality: N/A;  . TONSILLECTOMY AND ADENOIDECTOMY    . TUBAL LIGATION Bilateral 10/22/2016   Procedure: POST PARTUM TUBAL LIGATION;  Surgeon:  Janyth Pupa, DO;  Location: [facility];  Service: Obstetrics;  Laterality: Bilateral;     OB History    Gravida  3   Para  3   Term  3   Preterm      AB      Living  3     SAB      IAB      Ectopic      Multiple  0   Live Births  3           Family History  Problem Relation Age of Onset  . Breast cancer Maternal Grandmother        x2  . Leukemia Maternal Grandmother   . Skin cancer Mother        basal cell    Social History   Tobacco Use  . Smoking status: Current Every Day Smoker    Types: Cigarettes    Last attempt to quit: 11/10/2019    Years since quitting: 0.5  . Smokeless tobacco: Never Used  . Tobacco comment: smoked tobacco 11/2019, vapes now  Vaping Use  . Vaping Use: Every day  . Start date: 11/10/2019  . Substances: Nicotine  Substance Use Topics  . Alcohol use: Yes    Comment: 2 shots a month  . Drug use: Yes    Types: Marijuana  Comment: nightly    Home Medications Prior to Admission medications   Medication Sig Start Date End Date Taking? Authorizing Provider  ALPRAZolam Duanne Moron) 0.5 MG tablet Take 1 tablet (0.5 mg total) by mouth at bedtime as needed for anxiety. 05/29/20   Inda Coke, PA  levothyroxine (SYNTHROID) 50 MCG tablet Take 1 tablet (50 mcg total) by mouth daily before breakfast. 03/17/20   Inda Coke, PA  naproxen sodium (ANAPROX) 550 MG tablet Take 550 mg by mouth 2 (two) times daily. 04/21/20   [provider]  traZODone (DESYREL) 50 MG tablet Take 1 tablet (50 mg total) by mouth at bedtime as needed for sleep. 05/25/19 07/02/19  Nevada Crane, MD    Allergies    Reglan [metoclopramide]  Review of Systems   Review of Systems  Constitutional: Negative for chills and fever.  HENT: Negative for ear pain and sore throat.   Eyes: Negative for pain and visual disturbance.  Respiratory: Negative for cough and shortness of breath.   Cardiovascular: Negative for chest pain and palpitations.   Gastrointestinal: Negative for abdominal pain and vomiting.  Genitourinary: Negative for dysuria and hematuria.  Musculoskeletal: Negative for arthralgias and back pain.  Skin: Positive for color change and pallor. Negative for rash.  Neurological: Negative for seizures and syncope.  All other systems reviewed and are negative.   Physical Exam Updated Vital Signs BP 105/67   Pulse (!) 112   Temp 100.3 F (37.9 C) (Oral)   Resp 16   Ht 5\' 5"  (1.651 m)   Wt 97.5 kg   LMP 06/08/2020 (Exact Date)   SpO2 100%   BMI 35.78 kg/m   Physical Exam Vitals and nursing Callahan reviewed.  Constitutional:      General: She is not in acute distress.    Appearance: She is well-developed.  HENT:     Head: Normocephalic and atraumatic.  Eyes:     Conjunctiva/sclera: Conjunctivae normal.  Cardiovascular:     Rate and Rhythm: Regular rhythm. Tachycardia present.     Heart sounds: No murmur heard.   Pulmonary:     Effort: Pulmonary effort is normal. No respiratory distress.     Breath sounds: Normal breath sounds.  Abdominal:     Palpations: Abdomen is soft.     Tenderness: There is no abdominal tenderness.  Musculoskeletal:     Cervical back: Neck supple.     Comments: Erythema as above over left leg  Skin:    Comments: There is generalized erythema blanchable over her upper anterior thigh, drain is intact, no tenderness or significant swelling  Neurological:     General: No focal deficit present.     Mental Status: She is alert.  Psychiatric:        Mood and Affect: Mood normal.     ED Results / Procedures / Treatments   Labs (all labs ordered are listed, but only abnormal results are displayed) Labs Reviewed  LACTIC ACID, PLASMA - Abnormal; Notable for the following components:      Result Value   Lactic Acid, Venous 2.0 (*)    All other components within normal limits  CBC WITH DIFFERENTIAL/PLATELET - Abnormal; Notable for the following components:   WBC 12.9 (*)     Neutro Abs 10.1 (*)    All other components within normal limits  COMPREHENSIVE METABOLIC PANEL - Abnormal; Notable for the following components:   Sodium 134 (*)    CO2 20 (*)    Glucose, Bld 141 (*)  Calcium 8.8 (*)    All other components within normal limits  CULTURE, BLOOD (ROUTINE X 2)  CULTURE, BLOOD (ROUTINE X 2)  LACTIC ACID, PLASMA    EKG EKG Interpretation  Date/Time:  Thursday June 08 2020 20:43:19 EDT Ventricular Rate:  130 PR Interval:  142 QRS Duration: 85 QT Interval:  313 QTC Calculation: 461 R Axis:   63 Text Interpretation: Sinus tachycardia Confirmed by Madalyn Rob [phone]) on 06/08/2020 8:46:54 PM   Radiology CT PELVIS W CONTRAST  Result Date: 06/08/2020 CLINICAL DATA:  Lymphadenopathy, inguinal/left pelvic seroma with worsening erythema, purulence. Concern for infection/abscess. Recent surgery to remove melanoma and metastatic nodes in the left inguinal chain. EXAM: CT PELVIS WITH CONTRAST TECHNIQUE: Multidetector CT imaging of the pelvis was performed using the standard protocol following the bolus administration of intravenous contrast. CONTRAST:  124mL OMNIPAQUE IOHEXOL 300 MG/ML  SOLN COMPARISON:  Abdomen and pelvis CT 04/10/2020 FINDINGS: Urinary Tract:  Distal ureters and urinary bladder are unremarkable. Bowel: Unremarkable appearance of the visualized large and small pelvic bowel loops. Normal appendix in the right lower quadrant. Vascular/Lymphatic: Prominent lymph nodes are seen in the left iliac and inguinal chains and along the left pelvic sidewall. Largest nodes include a 10 mm left iliac node (9/83) and a 10 mm left inguinal node (9/85). Postsurgical changes at the site of recent left inguinal nodal dissection, better detailed below Reproductive: Anteverted uterus. Slightly arcuate configuration of the uterine fundus. No concerning adnexal lesions. Normal follicles in both ovaries. Other: There are postsurgical changes in the left inguinal chain  with a pigtail surgical drain in place in the medial thigh. A residual rim enhancing collection is seen along the superior margin of this pigtail drainage catheter measuring approximately 2.5 x 1.8 x 2.9 cm in size (9/120, 5/120). Extensive surrounding subcutaneous fat stranding and phlegmonous changes with overlying skin thickening. No soft tissue gas. No other acute or worrisome soft tissue abnormality. Tiny fat containing umbilical hernia. No abdominopelvic free air or fluid. Musculoskeletal: Postsurgical changes in the left inguinal chain appear superficial to the medial compartment of thigh. Musculature is normal and symmetric. No acute osseous abnormality or suspicious osseous lesion. IMPRESSION: 1. Postsurgical changes in the left inguinal chain with a pigtail surgical drain in place in the medial thigh. A residual rim enhancing collection is seen along the superior margin of this pigtail drainage catheter measuring approximately 2.5 x 1.8 x 2.9 cm in size. Extensive surrounding subcutaneous fat stranding and phlegmonous changes with overlying skin thickening concerning for possible abscess in the setting of purulence and discharge. No soft tissue gas. 2. Additional nonspecific borderline enlarged lymph nodes in the left iliac and inguinal chains and along the left pelvic sidewall, possibly reactive though given recent concern for metastatic adenopathy in this vicinity, recommend correlation with previous tissue diagnosis of the resected lymph node and follow-up imaging or tissue sampling as warranted. Electronically Signed   By: Lovena Le M.D.   On: 06/08/2020 21:44    Procedures Procedures   Medications Ordered in ED Medications  vancomycin (VANCOCIN) IVPB 1000 mg/200 mL premix (has no administration in time range)    Followed by  vancomycin (VANCOCIN) IVPB 1000 mg/200 mL premix (1,000 mg Intravenous New Bag/Given 06/08/20 2316)  vancomycin (VANCOCIN) IVPB 1000 mg/200 mL premix (has no  administration in time range)  piperacillin-tazobactam (ZOSYN) IVPB 3.375 g (has no administration in time range)  0.9 %  sodium chloride infusion ( Intravenous New Bag/Given 06/08/20 2210)  sodium chloride 0.9 %  bolus 1,000 mL ( Intravenous Stopped 06/08/20 2222)  iohexol (OMNIPAQUE) 300 MG/ML solution 100 mL (100 mLs Intravenous Contrast Given 06/08/20 2118)  piperacillin-tazobactam (ZOSYN) IVPB 3.375 g (0 g Intravenous Stopped 06/08/20 2227)  fentaNYL (SUBLIMAZE) injection 50 mcg (50 mcg Intravenous Given 06/08/20 2241)    ED Course  I have reviewed the triage vital signs and the nursing notes.  Pertinent labs & imaging results that were available during my care of the patient were reviewed by me and considered in my medical decision making (see chart for details).    MDM Rules/Calculators/A&P                         35 year old lady presenting to ER with concern for postop infection.  She had a lymph node biopsy and seroma which was drained by IR at [facility].  She now has overlying erythema and reported fever at home.  Labs noted for leukocytosis.  CT scan concerning for possible abscess.  Suspect at a minimum cellulitis.  Will start on IV antibiotics.  Discussed case with hospitalist at [facility] via PAL line, Dr. Casper Harrison will accept.   Final Clinical Impression(s) / ED Diagnoses Final diagnoses:  Post-operative wound abscess  Cellulitis, unspecified cellulitis site    Rx / DC Orders ED Discharge Orders    None       Lucrezia Starch, MD 06/08/20 2319

## 2020-06-08 NOTE — ED Triage Notes (Addendum)
Pt states she had surgery 5 weeks ago to remove a melanoma from her left foot  and they also removed lymph nodes from her groin   Pt states she had some problems after surgery and had an infection   Pt states they placed a drain in   Pt states she has been having pus coming out for about a week but more today  Pt states the area around the drain and at the incision is red and painful  Pt started running a fever today  Pt went to [facility] to the out pt radiology dept  Pt was prescribed keflex but she has not picked it up yet  Pt states this evening her fever went up and states she has never felt like this with an infection

## 2020-06-08 NOTE — Progress Notes (Signed)
Pharmacy Antibiotic Note  Lori Callahan is a 34 y.o. female admitted on 06/08/2020 presenting with post-op infection complication, drain in place with purulent drainage and now worsening with increased fevers.  Pharmacy has been consulted for vancomycin and zosyn dosing.  Plan: Zosyn 3.375g IV every 8 hours (extended infusion) Vancomycin 2000 mg IV x 1, then 1000 mg IV every 12 hours (eAUC 523, Goal AUC 400-550, SCr used 0.8) Monitor renal function, Cx and clinical progression to narrow Vancomycin levels as indicated  Height: 5\' 5"  (165.1 cm) Weight: 97.5 kg (215 lb) IBW/kg (Calculated) : 57  Temp (24hrs), Avg:98.5 F (36.9 C), Min:98.5 F (36.9 C), Max:98.5 F (36.9 C)  Recent Labs  Lab 06/08/20 2048  WBC 12.9*  CREATININE 0.68  LATICACIDVEN 2.0*    Estimated Creatinine Clearance: 114.5 mL/min (by C-G formula based on SCr of 0.68 mg/dL).    Allergies  Allergen Reactions  . Reglan [Metoclopramide] Shortness Of Breath    Bertis Ruddy, PharmD Clinical Pharmacist ED Pharmacist Phone # [phone] 06/08/2020 9:56 PM

## 2020-06-08 NOTE — Telephone Encounter (Signed)
Spoke with patient. Called to review potential surgery dates.  Patient is currently being followed by general surgery for inguinal, left leg and foot swelling, currently has a drain in place. She is scheduled for f/u with general surgeon on 4/4. Patient will plan to discuss surgery planing with general surgeon and f/u on 4/4 with how she would like to proceed.   Routing to [employer].

## 2020-06-08 NOTE — ED Notes (Signed)
Called PAL Line for consult with oncology surgeon.  Spoke with [employer]

## 2020-06-09 DIAGNOSIS — T8189XA Other complications of procedures, not elsewhere classified, initial encounter: Secondary | ICD-10-CM | POA: Diagnosis present

## 2020-06-09 DIAGNOSIS — F17210 Nicotine dependence, cigarettes, uncomplicated: Secondary | ICD-10-CM | POA: Diagnosis not present

## 2020-06-09 DIAGNOSIS — Z859 Personal history of malignant neoplasm, unspecified: Secondary | ICD-10-CM | POA: Diagnosis not present

## 2020-06-09 DIAGNOSIS — D72829 Elevated white blood cell count, unspecified: Secondary | ICD-10-CM | POA: Diagnosis not present

## 2020-06-09 DIAGNOSIS — R000 Tachycardia, unspecified: Secondary | ICD-10-CM | POA: Diagnosis not present

## 2020-06-09 DIAGNOSIS — E039 Hypothyroidism, unspecified: Secondary | ICD-10-CM | POA: Insufficient documentation

## 2020-06-09 DIAGNOSIS — L03116 Cellulitis of left lower limb: Secondary | ICD-10-CM | POA: Diagnosis not present

## 2020-06-09 DIAGNOSIS — Z20822 Contact with and (suspected) exposure to covid-19: Secondary | ICD-10-CM | POA: Diagnosis not present

## 2020-06-09 DIAGNOSIS — Z79899 Other long term (current) drug therapy: Secondary | ICD-10-CM | POA: Diagnosis not present

## 2020-06-09 LAB — RESP PANEL BY RT-PCR (FLU A&B, COVID) ARPGX2
Influenza A by PCR: NEGATIVE
Influenza B by PCR: NEGATIVE
SARS Coronavirus 2 by RT PCR: NEGATIVE

## 2020-06-12 NOTE — Telephone Encounter (Signed)
Spoke with patient. Patient states she was admitted to hospital on 06/08/20 for treatment of post-op infection, scheduled to be discharged today. She has a f/u with general surgeon on 06/21/20. Patient will return call after f/u with general surgeon to further discuss surgery scheduling. Advised I will update Dr. Talbert Nan and return call if any additional recommendations. Patient agreeable.    Will keep patient in surgery reminder for f/u.   Routing to Dr. Talbert Nan for final review.   Encounter closed.   Cc: Hayley Carder

## 2020-06-13 LAB — CULTURE, BLOOD (ROUTINE X 2)
Culture: NO GROWTH
Culture: NO GROWTH
Special Requests: ADEQUATE
Special Requests: ADEQUATE

## 2020-06-13 NOTE — Telephone Encounter (Signed)
She should be healed from her melanoma surgery prior to the hysterectomy.

## 2020-06-16 ENCOUNTER — Other Ambulatory Visit (INDEPENDENT_AMBULATORY_CARE_PROVIDER_SITE_OTHER): Payer: Commercial Managed Care - PPO

## 2020-06-16 ENCOUNTER — Other Ambulatory Visit: Payer: Self-pay

## 2020-06-16 ENCOUNTER — Ambulatory Visit
Admission: RE | Admit: 2020-06-16 | Discharge: 2020-06-16 | Disposition: A | Discharge status: Home / Self Care | Payer: 59 | Source: Ambulatory Visit | Attending: Nurse Practitioner | Admitting: Nurse Practitioner

## 2020-06-16 ENCOUNTER — Other Ambulatory Visit: Payer: Self-pay | Admitting: Nurse Practitioner

## 2020-06-16 ENCOUNTER — Ambulatory Visit
Admission: RE | Admit: 2020-06-16 | Discharge: 2020-06-16 | Disposition: A | Discharge status: Home / Self Care | Payer: Commercial Managed Care - PPO | Source: Ambulatory Visit | Attending: Nurse Practitioner | Admitting: Nurse Practitioner

## 2020-06-16 DIAGNOSIS — N644 Mastodynia: Secondary | ICD-10-CM

## 2020-06-16 DIAGNOSIS — E039 Hypothyroidism, unspecified: Secondary | ICD-10-CM | POA: Diagnosis not present

## 2020-06-16 DIAGNOSIS — N6320 Unspecified lump in the left breast, unspecified quadrant: Secondary | ICD-10-CM

## 2020-06-16 LAB — TSH: TSH: 7.16 u[IU]/mL — ABNORMAL HIGH (ref 0.35–4.50)

## 2020-06-19 ENCOUNTER — Other Ambulatory Visit: Payer: Self-pay | Admitting: Physician Assistant

## 2020-06-19 DIAGNOSIS — E039 Hypothyroidism, unspecified: Secondary | ICD-10-CM

## 2020-06-19 MED ORDER — LEVOTHYROXINE SODIUM 75 MCG PO TABS
75.0000 ug | ORAL_TABLET | Freq: Every day | ORAL | 1 refills | Status: DC
Start: 2020-06-19 — End: 2021-01-08

## 2020-06-21 DIAGNOSIS — A4902 Methicillin resistant Staphylococcus aureus infection, unspecified site: Secondary | ICD-10-CM | POA: Insufficient documentation

## 2020-06-22 ENCOUNTER — Other Ambulatory Visit: Payer: Self-pay | Admitting: Obstetrics and Gynecology

## 2020-06-22 ENCOUNTER — Other Ambulatory Visit: Payer: Commercial Managed Care - PPO

## 2020-07-10 ENCOUNTER — Other Ambulatory Visit: Payer: Self-pay | Admitting: Obstetrics and Gynecology

## 2020-07-24 ENCOUNTER — Telehealth: Payer: Self-pay | Admitting: *Deleted

## 2020-07-24 NOTE — Telephone Encounter (Signed)
Left message to call Sharee Pimple, RN at [facility], [phone]. Patient left message requesting to proceed with scheduling surgery.

## 2020-07-25 NOTE — Telephone Encounter (Signed)
Spoke with patient. Patient states she has been cleared by general surgeon for left lower extremity post-op infection, see OV note in Epic dated 07/12/20. Ready to proceed with surgery as detailed below. Patient request to schedule on 09/18/20. Advised patient I will f/u to confirm once surgery posted. Patient agreeable.    Routing to [employer], Francesca Jewett, MD  Burnice Logan, RN; [facility] Surgery: TLH/BS/possible treatment of endometriosis/cystoscopy   Diagnosis: Severe dysmenorrhea   Location: [facility]   Status: Outpatient with Overnight Bed   Time: 120 Minutes   Assistant: First Available Provider   Urgency: At Patient's Convenience   Pre-Op Appointment: To Be Scheduled   Post-Op Appointment(s): 1 Week, 4 Weeks   Time Out Of Work: 4-6 weeks   Thanks,  [employer]

## 2020-08-02 NOTE — Progress Notes (Signed)
Cardiology Office Note   Date:  08/03/2020   ID:  SARAHMARIE Callahan, DOB 03-13-1985, MRN [id number]  PCP:  Inda Coke, PA  Cardiologist:   None Referring:  Inda Coke, [place]  Chief Complaint  Patient presents with  . Palpitations      History of Present Illness: Lori Callahan is a 35 y.o. female who is referred by Inda Coke, PA for evaluation of aortic atherosclerosis.  This was found recently when she was having a work-up for melanoma.  She had to have a melanoma resected and she did have lymph nodes resected although there was no apparent spread.  She had a seroma and subsequently an MRSA infection associated with this.  During this work-up she was found to have aortic atherosclerosis.  She has never had any cardiac history other than palpitations.  She had a monitor that demonstrated PVCs and PACs.  This was in February.  She does have anxiety and is being treated for bipolar but she says she feels her heart racing even when she is not anxious.  She has a sedentary job and likely sitting and feel her heart going fast.  She might feel it skipping.  She takes Xanax occasionally which does not help.  He has to go away on its own.  She is not describing presyncope or syncope.  She also describes occasional skipping beats.  She actually was given propranolol which she is just going to start today.  She does have thyroid issues and recently had her Synthroid increased.  She is not describing chest pressure, neck or arm discomfort.  She is not having any new shortness of breath, PND or orthopnea.  She has 3 children ages 15-3.  Past Medical History:  Diagnosis Date  . Abnormal Pap smear of cervix   . ADHD (attention deficit hyperactivity disorder)   . Anxiety   . Bipolar disorder ([place])   . Cancer ([place])    melanoma  . Herpes genitalia   . HPV (human papilloma virus) infection   . Hypothyroidism     Past Surgical History:  Procedure Laterality Date  . CESAREAN SECTION     . CESAREAN SECTION  03/02/2012   Procedure: CESAREAN SECTION;  Surgeon: Sharene Butters, MD;  Location: [facility];  Service: Obstetrics;  Laterality: N/A;  . CESAREAN SECTION N/A 10/22/2016   Procedure: CESAREAN SECTION;  Surgeon: Janyth Pupa, DO;  Location: [facility];  Service: Obstetrics;  Laterality: N/A;  . TONSILLECTOMY AND ADENOIDECTOMY    . TUBAL LIGATION Bilateral 10/22/2016   Procedure: POST PARTUM TUBAL LIGATION;  Surgeon: Janyth Pupa, DO;  Location: [facility];  Service: Obstetrics;  Laterality: Bilateral;     Current Outpatient Medications  Medication Sig Dispense Refill  . ALPRAZolam (XANAX) 0.5 MG tablet Take 1 tablet (0.5 mg total) by mouth at bedtime as needed for anxiety. 30 tablet 1  . ARIPiprazole (ABILIFY) 10 MG tablet Take 10 mg by mouth daily.    Marland Kitchen levothyroxine (SYNTHROID) 75 MCG tablet Take 1 tablet (75 mcg total) by mouth daily. 90 tablet 1  . naproxen sodium (ANAPROX) 550 MG tablet Take 550 mg by mouth 2 (two) times daily.    . propranolol (INDERAL) 40 MG tablet Take by mouth.     No current facility-administered medications for this visit.    Allergies:   Reglan [metoclopramide]    Social History:  The patient  reports that she has been smoking cigarettes. She has never used smokeless  tobacco. She reports current alcohol use. She reports current drug use. Drug: Marijuana.   Family History:  The patient's family history includes Breast cancer in her maternal grandmother; Leukemia in her maternal grandmother; Skin cancer in her mother.   Her father died of suicide   ROS:  Please see the history of present illness.   Otherwise, review of systems are positive for none.   All other systems are reviewed and negative.    PHYSICAL EXAM: VS:  BP 110/74   Pulse 83   Ht 5\' 5"  (1.651 m)   Wt 223 lb 3.2 oz (101.2 kg)   SpO2 98%   BMI 37.14 kg/m  , BMI Body mass index is 37.14 kg/m. GENERAL:  Well appearing HEENT:  Pupils equal round and  reactive, fundi not visualized, oral mucosa unremarkable NECK:  No jugular venous distention, waveform within normal limits, carotid upstroke brisk and symmetric, no bruits, no thyromegaly LYMPHATICS:  No cervical, inguinal adenopathy LUNGS:  Clear to auscultation bilaterally BACK:  No CVA tenderness CHEST:  Unremarkable HEART:  PMI not displaced or sustained,S1 and S2 within normal limits, no S3, no S4, no clicks, no rubs, no murmurs ABD:  Flat, positive bowel sounds normal in frequency in pitch, no bruits, no rebound, no guarding, no midline pulsatile mass, no hepatomegaly, no splenomegaly EXT:  2 plus pulses throughout, no edema, no cyanosis no clubbing SKIN:  No rashes no nodules NEURO:  Cranial nerves II through XII grossly intact, motor grossly intact throughout PSYCH:  Cognitively intact, oriented to person place and time    EKG:  EKG is ordered today. The ekg ordered today demonstrates sinus rhythm, rate 83, axis within normal limits, intervals within normal limits, no acute ST wave changes.   Recent Labs: 06/08/2020: ALT 28; BUN 8; Creatinine, Ser 0.68; Hemoglobin 13.1; Platelets 191; Potassium 3.5; Sodium 134 06/16/2020: TSH 7.16    Lipid Panel    Component Value Date/Time   CHOL 162 04/21/2020 1457   TRIG 160 (H) 04/21/2020 1457   HDL 51 04/21/2020 1457   CHOLHDL 3.2 04/21/2020 1457   LDLCALC 85 04/21/2020 1457      Wt Readings from Last 3 Encounters:  08/03/20 223 lb 3.2 oz (101.2 kg)  06/08/20 215 lb (97.5 kg)  06/06/20 218 lb (98.9 kg)      Other studies Reviewed: Additional studies/ records that were reviewed today include: Labs, CT. Review of the above records demonstrates:  Please see elsewhere in the note.     ASSESSMENT AND PLAN:  PALPITATIONS:    She is about to start the propranolol and I agree with this.  If she does not tolerate this she would be a good candidate for perhaps 10 mg immediate release pill in pocket propranolol.  She is welcome to let  me know how she does with this through [facility].  AORTIC ATHEROSCLEROSIS: I am going to check a coronary calcium score.  We talked at length about a plant-based diet.  Goals of therapy for her lipids will be based on the coronary calcium score.  Her LDL was 85 but she might need her cholesterol to be some fractionated and might need more aggressive therapy.  At the very least she needs lifestyle therapies.  Current medicines are reviewed at length with the patient today.  The patient does not have concerns regarding medicines.  The following changes have been made:  no change  Labs/ tests ordered today include:   Orders Placed This Encounter  Procedures  .  CT CARDIAC SCORING (SELF PAY ONLY)  . EKG 12-Lead     Disposition:   FU with me in 6 months   Signed, Minus Breeding, MD  08/03/2020 9:11 AM    [place]

## 2020-08-03 ENCOUNTER — Other Ambulatory Visit: Payer: Self-pay

## 2020-08-03 ENCOUNTER — Ambulatory Visit (INDEPENDENT_AMBULATORY_CARE_PROVIDER_SITE_OTHER): Payer: Commercial Managed Care - PPO | Admitting: Cardiology

## 2020-08-03 ENCOUNTER — Encounter: Payer: Self-pay | Admitting: Cardiology

## 2020-08-03 VITALS — BP 110/74 | HR 83 | Ht 65.0 in | Wt 223.2 lb

## 2020-08-03 DIAGNOSIS — I700 Atherosclerosis of aorta: Secondary | ICD-10-CM | POA: Diagnosis not present

## 2020-08-03 DIAGNOSIS — R002 Palpitations: Secondary | ICD-10-CM

## 2020-08-03 HISTORY — DX: Palpitations: R00.2

## 2020-08-03 NOTE — Patient Instructions (Signed)
Medication Instructions:  Your physician recommends that you continue on your current medications as directed. Please refer to the Current Medication list given to you today.  *If you need a refill on your cardiac medications before your next appointment, please call your pharmacy*   Testing/Procedures: Dr. Percival Spanish has ordered a CT coronary calcium score. This test is done at [address]. This is $99 out of pocket.   Coronary CalciumScan A coronary calcium scan is an imaging test used to look for deposits of calcium and other fatty materials (plaques) in the inner lining of the blood vessels of the heart (coronary arteries). These deposits of calcium and plaques can partly clog and narrow the coronary arteries without producing any symptoms or warning signs. This puts a person at risk for a heart attack. This test can detect these deposits before symptoms develop. Tell a health care provider about:  Any allergies you have.  All medicines you are taking, including vitamins, herbs, eye drops, creams, and over-the-counter medicines.  Any problems you or family members have had with anesthetic medicines.  Any blood disorders you have.  Any surgeries you have had.  Any medical conditions you have.  Whether you are pregnant or may be pregnant. What are the risks? Generally, this is a safe procedure. However, problems may occur, including:  Harm to a pregnant woman and her unborn baby. This test involves the use of radiation. Radiation exposure can be dangerous to a pregnant woman and her unborn baby. If you are pregnant, you generally should not have this procedure done.  Slight increase in the risk of cancer. This is because of the radiation involved in the test. What happens before the procedure? No preparation is needed for this procedure. What happens during the procedure?  You will undress and remove any jewelry around your neck or chest.  You will put on a  hospital gown.  Sticky electrodes will be placed on your chest. The electrodes will be connected to an electrocardiogram (ECG) machine to record a tracing of the electrical activity of your heart.  A CT scanner will take pictures of your heart. During this time, you will be asked to lie still and hold your breath for 2-3 seconds while a picture of your heart is being taken. The procedure may vary among health care providers and hospitals. What happens after the procedure?  You can get dressed.  You can return to your normal activities.  It is up to you to get the results of your test. Ask your health care provider, or the department that is doing the test, when your results will be ready. Summary  A coronary calcium scan is an imaging test used to look for deposits of calcium and other fatty materials (plaques) in the inner lining of the blood vessels of the heart (coronary arteries).  Generally, this is a safe procedure. Tell your health care provider if you are pregnant or may be pregnant.  No preparation is needed for this procedure.  A CT scanner will take pictures of your heart.  You can return to your normal activities after the scan is done. This information is not intended to replace advice given to you by your health care provider. Make sure you discuss any questions you have with your health care provider. Document Released: 08/24/2007 Document Revised: 01/15/2016 Document Reviewed: 01/15/2016 Elsevier Interactive Patient Education  2017 [employer].     Follow-Up: At [facility], you and your health needs  are our priority.  As part of our continuing mission to provide you with exceptional heart care, we have created designated Provider Care Teams.  These Care Teams include your primary Cardiologist (physician) and Advanced Practice Providers (APPs -  Physician Assistants and Nurse Practitioners) who all work together to provide you with the care you need, when you need  it.  We recommend signing up for the patient portal called "MyChart".  Sign up information is provided on this After Visit Summary.  MyChart is used to connect with patients for Virtual Visits (Telemedicine).  Patients are able to view lab/test results, encounter notes, upcoming appointments, etc.  Non-urgent messages can be sent to your provider as well.   To learn more about what you can do with MyChart, go to [email or web address].    Your next appointment:   6 month(s)  The format for your next appointment:   In Person  Provider:   You may see Dr. Percival Spanish or one of the following Advanced Practice Providers on your designated Care Team:    Rosaria Ferries, PA-C  Jory Sims, DNP, ANP    Other Instructions Netflix documentary - "Gamechanger"

## 2020-08-09 ENCOUNTER — Encounter: Payer: Self-pay | Admitting: Obstetrics and Gynecology

## 2020-08-10 ENCOUNTER — Encounter: Payer: Self-pay | Admitting: *Deleted

## 2020-08-10 NOTE — Telephone Encounter (Signed)
Spoke with patient regarding surgery benefits. Patient acknowledges understanding of information presented. Patient is aware that benefits presented are professional benefits only. Patient is aware that once surgery is scheduled, the hospital will call with separate benefits. See account note. No PA required [phone]  Routing to Glorianne Manchester, RN, for surgery scheduling.

## 2020-08-10 NOTE — Telephone Encounter (Signed)
Left message to call Keniel Ralston, RN at [facility], [phone].  

## 2020-08-11 NOTE — Telephone Encounter (Signed)
Left message to call Trameka Dorough, RN at [facility], [phone].  

## 2020-08-15 ENCOUNTER — Encounter: Payer: Self-pay | Admitting: Physician Assistant

## 2020-08-15 DIAGNOSIS — C4372 Malignant melanoma of left lower limb, including hip: Secondary | ICD-10-CM

## 2020-08-15 NOTE — Telephone Encounter (Signed)
Lori Callahan, please see message from patient. Order for referral placed.

## 2020-08-21 NOTE — Telephone Encounter (Signed)
Spoke with patient. Surgery date request confirmed.  Advised surgery is scheduled for 09/18/20, [place], 0730.  Surgery instruction sheet and hospital brochure reviewed, printed copy will be mailed. Patient advised if Covid screening and quarantine requirements and agreeable.  Pre-op scheduled for 7/7 at 4:30pm.   Routing to provider. Encounter closed.  Cc: KimAlexis

## 2020-08-29 ENCOUNTER — Encounter: Payer: Self-pay | Admitting: Physician Assistant

## 2020-09-04 DIAGNOSIS — I700 Atherosclerosis of aorta: Secondary | ICD-10-CM

## 2020-09-04 HISTORY — DX: Atherosclerosis of aorta: I70.0

## 2020-09-07 ENCOUNTER — Inpatient Hospital Stay: Admission: RE | Admit: 2020-09-07 | Payer: Commercial Managed Care - PPO | Source: Ambulatory Visit

## 2020-09-12 ENCOUNTER — Other Ambulatory Visit: Payer: Self-pay

## 2020-09-12 ENCOUNTER — Encounter (HOSPITAL_BASED_OUTPATIENT_CLINIC_OR_DEPARTMENT_OTHER): Payer: Self-pay | Admitting: Obstetrics and Gynecology

## 2020-09-12 DIAGNOSIS — Z973 Presence of spectacles and contact lenses: Secondary | ICD-10-CM

## 2020-09-12 HISTORY — DX: Presence of spectacles and contact lenses: Z97.3

## 2020-09-12 NOTE — Progress Notes (Signed)
YOU ARE SCHEDULED FOR A COVID TEST ON   09-14-2020 AT 305 PM . THIS TEST MUST BE DONE BEFORE SURGERY. GO TO  [facility] [place], [place], IT IS APPROXIMATELY 2 MINUTES PAST [employer] ON THE RIGHT AND REMAIN IN YOUR CAR, THIS IS A DRIVE UP TEST.       Your procedure is scheduled on 09-18-2020  Report to [facility].   Call this number if you have problems the morning of surgery  :[phone].   OUR ADDRESS IS [address].  WE ARE LOCATED [facility].  PLEASE BRING YOUR INSURANCE CARD AND PHOTO ID DAY OF SURGERY.  ONLY ONE PERSON ALLOWED IN FACILITY WAITING AREA.                                     REMEMBER:  DO NOT EAT FOOD, CANDY GUM OR MINTS  AFTER MIDNIGHT . YOU MAY HAVE CLEAR LIQUIDS FROM MIDNIGHT UNTIL 430 AM. NO CLEAR LIQUIDS AFTER 430 AM DAY OF SURGERY.   YOU MAY  BRUSH YOUR TEETH MORNING OF SURGERY AND RINSE YOUR MOUTH OUT, NO CHEWING GUM CANDY OR MINTS.    CLEAR LIQUID DIET   Foods Allowed                                                                     Foods Excluded  Coffee and tea, regular and decaf                             liquids that you cannot  Plain Jell-O any favor except red or purple                                           see through such as: Fruit ices (not with fruit pulp)                                     milk, soups, orange juice  Iced Popsicles                                    All solid food Carbonated beverages, regular and diet                                    Cranberry, grape and apple juices Sports drinks like Gatorade Lightly seasoned clear broth or consume(fat free) Sugar, honey syrup  Sample Menu Breakfast                                Lunch  Supper Cranberry juice                    Beef broth                            Chicken broth Jell-O                                     Grape juice                           Apple  juice Coffee or tea                        Jell-O                                      Popsicle                                                Coffee or tea                        Coffee or tea  _____________________________________________________________________     TAKE THESE MEDICATIONS MORNING OF SURGERY WITH A SIP OF WATER: LEVOTHYROXINE, PROPRANOLOL, ALPRAZOLAM IF NEEDED.  ONE VISITOR IS ALLOWED IN WAITING ROOM ONLY DAY OF SURGERY.  NO VISITOR MAY SPEND THE NIGHT.  VISITOR ARE ALLOWED TO STAY UNTIL 800 PM.                                    DO NOT WEAR JEWERLY, MAKE UP. DO NOT WEAR LOTIONS, POWDERS, PERFUMES OR NAIL POLISH. DO NOT SHAVE FOR 24 HOURS PRIOR TO DAY OF SURGERY. MEN MAY SHAVE FACE AND NECK. CONTACTS, GLASSES, OR DENTURES MAY NOT BE WORN TO SURGERY.                                    [facility] IS NOT RESPONSIBLE  FOR ANY BELONGINGS.                                                                    Marland Kitchen            - Preparing for Surgery Before surgery, you can play an important role.  Because skin is not sterile, your skin needs to be as free of germs as possible.  You can reduce the number of germs on your skin by washing with CHG (chlorahexidine gluconate) soap before surgery.  CHG is an antiseptic cleaner which kills germs and bonds with the skin to continue killing germs even after washing. Please DO NOT use if you have an allergy to CHG or antibacterial soaps.  If your  skin becomes reddened/irritated stop using the CHG and inform your nurse when you arrive at Short Stay. Do not shave (including legs and underarms) for at least 48 hours prior to the first CHG shower.  You may shave your face/neck. Please follow these instructions carefully:  1.  Shower with CHG Soap the night before surgery and the  morning of Surgery.  2.  If you choose to wash your hair, wash your hair first as usual with your  normal  shampoo.  3.  After you shampoo, rinse your hair and  body thoroughly to remove the  shampoo.                            4.  Use CHG as you would any other liquid soap.  You can apply chg directly  to the skin and wash                      Gently with a scrungie or clean washcloth.  5.  Apply the CHG Soap to your body ONLY FROM THE NECK DOWN.   Do not use on face/ open                           Wound or open sores. Avoid contact with eyes, ears mouth and genitals (private parts).                       Wash face,  Genitals (private parts) with your normal soap.             6.  Wash thoroughly, paying special attention to the area where your surgery  will be performed.  7.  Thoroughly rinse your body with warm water from the neck down.  8.  DO NOT shower/wash with your normal soap after using and rinsing off  the CHG Soap.                9.  Pat yourself dry with a clean towel.            10.  Wear clean pajamas.            11.  Place clean sheets on your bed the night of your first shower and do not  sleep with pets. Day of Surgery : Do not apply any lotions/deodorants the morning of surgery.  Please wear clean clothes to the hospital/surgery center.  FAILURE TO FOLLOW THESE INSTRUCTIONS MAY RESULT IN THE CANCELLATION OF YOUR SURGERY PATIENT SIGNATURE_________________________________  NURSE SIGNATURE__________________________________  ________________________________________________________________________                                                        QUESTIONS Hansel Feinstein PRE OP NURSE PHONE [phone].

## 2020-09-12 NOTE — Progress Notes (Addendum)
Spoke w/ via phone for pre-op interview---PT Lab needs dos----  URINE POCT             Lab results------HAS LAB APPT 09-14-2020 930 AM FOR CBC BMP T & S  COVID test -----09-14-2020 305 PM (OVERNIGHT STAY) --- Med rec completed Medications to take morning of surgery -----LEVOTHYROXINE, PROPRANOLOL, ALPRAZOLAM PRN SURGERY ORDERS PENDING Diabetic medication -----N/A Patient instructed no nail polish to be worn day of surgery Patient instructed to bring photo id and insurance card day of surgery Patient aware to have Driver (ride ) / caregiver    SPOUSE BRETT for 24 hours after surgery  Patient Special Instructions -----OVER NIGHT STAY INSTRUCTIONS GIVEN Pre-Op special Istructions ---NO SMOKING/VAPING 24 HOURS BEFORE SURGERY, SURGERY ORDERS REQ DR Talbert Nan Epic IB Patient verbalized understanding of instructions that were given at this phone interview. Patient denies shortness of breath, chest pain, fever, cough at this phone interview.   EKG 08-03-2020 Epic LOV DR [facility] 08-03-2020 CARDIOLOGY Epic MONITOR REPORT 05-09-2020 [facility] CARE EVERYHWHERE 09-04-2020 (LEFT FOOT MELANOMA REMOVED 04-2020)

## 2020-09-14 ENCOUNTER — Other Ambulatory Visit (HOSPITAL_COMMUNITY)
Admission: RE | Admit: 2020-09-14 | Discharge: 2020-09-14 | Disposition: A | Discharge status: Home / Self Care | Payer: Commercial Managed Care - PPO | Source: Ambulatory Visit | Attending: Obstetrics and Gynecology | Admitting: Obstetrics and Gynecology

## 2020-09-14 ENCOUNTER — Ambulatory Visit (INDEPENDENT_AMBULATORY_CARE_PROVIDER_SITE_OTHER): Payer: Commercial Managed Care - PPO | Admitting: Obstetrics and Gynecology

## 2020-09-14 ENCOUNTER — Other Ambulatory Visit: Payer: Self-pay

## 2020-09-14 ENCOUNTER — Encounter: Payer: Commercial Managed Care - PPO | Admitting: Obstetrics and Gynecology

## 2020-09-14 ENCOUNTER — Encounter: Payer: Self-pay | Admitting: Obstetrics and Gynecology

## 2020-09-14 ENCOUNTER — Encounter (HOSPITAL_COMMUNITY)
Admission: RE | Admit: 2020-09-14 | Discharge: 2020-09-14 | Disposition: A | Discharge status: Home / Self Care | Payer: Commercial Managed Care - PPO | Source: Ambulatory Visit | Attending: Obstetrics and Gynecology | Admitting: Obstetrics and Gynecology

## 2020-09-14 VITALS — BP 132/64 | HR 94 | Ht 65.0 in | Wt 229.0 lb

## 2020-09-14 DIAGNOSIS — Z20822 Contact with and (suspected) exposure to covid-19: Secondary | ICD-10-CM | POA: Insufficient documentation

## 2020-09-14 DIAGNOSIS — E039 Hypothyroidism, unspecified: Secondary | ICD-10-CM

## 2020-09-14 DIAGNOSIS — N946 Dysmenorrhea, unspecified: Secondary | ICD-10-CM

## 2020-09-14 DIAGNOSIS — Z01812 Encounter for preprocedural laboratory examination: Secondary | ICD-10-CM | POA: Diagnosis present

## 2020-09-14 DIAGNOSIS — Z8481 Family history of carrier of genetic disease: Secondary | ICD-10-CM | POA: Diagnosis not present

## 2020-09-14 LAB — CBC
HCT: 39.8 % (ref 36.0–46.0)
Hemoglobin: 13.4 g/dL (ref 12.0–15.0)
MCH: 30.9 pg (ref 26.0–34.0)
MCHC: 33.7 g/dL (ref 30.0–36.0)
MCV: 91.7 fL (ref 80.0–100.0)
Platelets: 219 10*3/uL (ref 150–400)
RBC: 4.34 MIL/uL (ref 3.87–5.11)
RDW: 13.2 % (ref 11.5–15.5)
WBC: 7.7 10*3/uL (ref 4.0–10.5)
nRBC: 0 % (ref 0.0–0.2)

## 2020-09-14 LAB — COMPREHENSIVE METABOLIC PANEL
ALT: 27 U/L (ref 0–44)
AST: 24 U/L (ref 15–41)
Albumin: 3.9 g/dL (ref 3.5–5.0)
Alkaline Phosphatase: 60 U/L (ref 38–126)
Anion gap: 8 (ref 5–15)
BUN: 10 mg/dL (ref 6–20)
CO2: 22 mmol/L (ref 22–32)
Calcium: 8.9 mg/dL (ref 8.9–10.3)
Chloride: 106 mmol/L (ref 98–111)
Creatinine, Ser: 0.72 mg/dL (ref 0.44–1.00)
GFR, Estimated: >60 mL/min (ref >60)
Glucose, Bld: 98 mg/dL (ref 70–99)
Potassium: 4 mmol/L (ref 3.5–5.1)
Sodium: 136 mmol/L (ref 135–145)
Total Bilirubin: 0.4 mg/dL (ref 0.3–1.2)
Total Protein: 7.5 g/dL (ref 6.5–8.1)

## 2020-09-14 LAB — SARS CORONAVIRUS 2 (TAT 6-24 HRS): SARS Coronavirus 2: NEGATIVE
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
## 2020-09-15 LAB — TSH: TSH: 4.37 mIU/L

## 2020-09-17 NOTE — Anesthesia Preprocedure Evaluation (Addendum)
Anesthesia Evaluation  Patient identified by MRN, date of birth, ID band Patient awake    Reviewed: Allergy & Precautions, NPO status , Patient's Chart, lab work & pertinent test results  Airway Mallampati: I  TM Distance: >3 FB Neck ROM: Full    Dental no notable dental hx.    Pulmonary former smoker,    Pulmonary exam normal breath sounds clear to auscultation       Cardiovascular negative cardio ROS Normal cardiovascular exam Rhythm:Regular Rate:Normal     Neuro/Psych PSYCHIATRIC DISORDERS Anxiety Bipolar Disorder negative neurological ROS     GI/Hepatic negative GI ROS, (+)     substance abuse  ,   Endo/Other  Hypothyroidism   Renal/GU negative Renal ROS     Musculoskeletal negative musculoskeletal ROS (+)   Abdominal (+) + obese,   Peds  (+) ADHD Hematology negative hematology ROS (+)   Anesthesia Other Findings Severe Dysmenorrhea  Reproductive/Obstetrics hcg negative                            Anesthesia Physical Anesthesia Plan  ASA: 2  Anesthesia Plan: General   Post-op Pain Management:    Induction: Intravenous  PONV Risk Score and Plan: 4 or greater and Ondansetron, Dexamethasone, Midazolam and Scopolamine patch - Pre-op  Airway Management Planned: Oral ETT  Additional Equipment:   Intra-op Plan:   Post-operative Plan: Extubation in OR  Informed Consent: I have reviewed the patients History and Physical, chart, labs and discussed the procedure including the risks, benefits and alternatives for the proposed anesthesia with the patient or authorized representative who has indicated his/her understanding and acceptance.     Dental advisory given  Plan Discussed with: CRNA  Anesthesia Plan Comments:        Anesthesia Quick Evaluation

## 2020-09-18 ENCOUNTER — Ambulatory Visit (HOSPITAL_BASED_OUTPATIENT_CLINIC_OR_DEPARTMENT_OTHER): Payer: Commercial Managed Care - PPO | Admitting: Anesthesiology

## 2020-09-18 ENCOUNTER — Encounter (HOSPITAL_BASED_OUTPATIENT_CLINIC_OR_DEPARTMENT_OTHER): Payer: Self-pay | Admitting: Obstetrics and Gynecology

## 2020-09-18 ENCOUNTER — Encounter (HOSPITAL_BASED_OUTPATIENT_CLINIC_OR_DEPARTMENT_OTHER)
Admission: RE | Disposition: A | Discharge status: Home / Self Care | Payer: Self-pay | Source: Home / Self Care | Attending: Obstetrics and Gynecology

## 2020-09-18 ENCOUNTER — Ambulatory Visit (HOSPITAL_BASED_OUTPATIENT_CLINIC_OR_DEPARTMENT_OTHER)
Admission: RE | Admit: 2020-09-18 | Discharge: 2020-09-19 | Disposition: A | Discharge status: Home / Self Care | Payer: Commercial Managed Care - PPO | Attending: Obstetrics and Gynecology | Admitting: Obstetrics and Gynecology

## 2020-09-18 DIAGNOSIS — Z8619 Personal history of other infectious and parasitic diseases: Secondary | ICD-10-CM | POA: Insufficient documentation

## 2020-09-18 DIAGNOSIS — Z79899 Other long term (current) drug therapy: Secondary | ICD-10-CM | POA: Diagnosis not present

## 2020-09-18 DIAGNOSIS — Z90710 Acquired absence of both cervix and uterus: Secondary | ICD-10-CM | POA: Diagnosis present

## 2020-09-18 DIAGNOSIS — E039 Hypothyroidism, unspecified: Secondary | ICD-10-CM | POA: Insufficient documentation

## 2020-09-18 DIAGNOSIS — K660 Peritoneal adhesions (postprocedural) (postinfection): Secondary | ICD-10-CM | POA: Insufficient documentation

## 2020-09-18 DIAGNOSIS — Z8481 Family history of carrier of genetic disease: Secondary | ICD-10-CM | POA: Diagnosis not present

## 2020-09-18 DIAGNOSIS — Z87891 Personal history of nicotine dependence: Secondary | ICD-10-CM | POA: Diagnosis not present

## 2020-09-18 DIAGNOSIS — N838 Other noninflammatory disorders of ovary, fallopian tube and broad ligament: Secondary | ICD-10-CM | POA: Diagnosis not present

## 2020-09-18 DIAGNOSIS — Z888 Allergy status to other drugs, medicaments and biological substances status: Secondary | ICD-10-CM | POA: Insufficient documentation

## 2020-09-18 DIAGNOSIS — N946 Dysmenorrhea, unspecified: Secondary | ICD-10-CM | POA: Insufficient documentation

## 2020-09-18 DIAGNOSIS — Z8616 Personal history of COVID-19: Secondary | ICD-10-CM | POA: Diagnosis not present

## 2020-09-18 DIAGNOSIS — Z85820 Personal history of malignant melanoma of skin: Secondary | ICD-10-CM | POA: Insufficient documentation

## 2020-09-18 DIAGNOSIS — Z79890 Hormone replacement therapy (postmenopausal): Secondary | ICD-10-CM | POA: Diagnosis not present

## 2020-09-18 HISTORY — PX: TOTAL LAPAROSCOPIC HYSTERECTOMY WITH SALPINGECTOMY: SHX6742

## 2020-09-18 HISTORY — DX: Other specified soft tissue disorders: M79.662

## 2020-09-18 HISTORY — DX: Other specified soft tissue disorders: M79.89

## 2020-09-18 HISTORY — PX: CYSTOSCOPY: SHX5120

## 2020-09-18 HISTORY — DX: Concussion with loss of consciousness status unknown, initial encounter: S06.0XAA

## 2020-09-18 HISTORY — PX: LAPAROSCOPIC LYSIS OF ADHESIONS: SHX5905

## 2020-09-18 HISTORY — DX: Concussion with loss of consciousness of unspecified duration, initial encounter: S06.0X9A

## 2020-09-18 LAB — CBC
HCT: 37.8 % (ref 36.0–46.0)
Hemoglobin: 12.3 g/dL (ref 12.0–15.0)
MCH: 30.6 pg (ref 26.0–34.0)
MCHC: 32.5 g/dL (ref 30.0–36.0)
MCV: 94 fL (ref 80.0–100.0)
Platelets: 189 10*3/uL (ref 150–400)
RBC: 4.02 MIL/uL (ref 3.87–5.11)
RDW: 13.2 % (ref 11.5–15.5)
WBC: 10.5 10*3/uL (ref 4.0–10.5)
nRBC: 0 % (ref 0.0–0.2)

## 2020-09-18 LAB — TYPE AND SCREEN
ABO/RH(D): B POS
Antibody Screen: NEGATIVE

## 2020-09-18 LAB — POCT PREGNANCY, URINE: Preg Test, Ur: NEGATIVE

## 2020-09-18 SURGERY — HYSTERECTOMY, TOTAL, LAPAROSCOPIC, WITH SALPINGECTOMY
Anesthesia: General | Site: Bladder

## 2020-09-18 MED ORDER — PROPOFOL 10 MG/ML IV BOLUS
INTRAVENOUS | Status: AC
  Filled 2020-09-18: qty 40

## 2020-09-18 MED ORDER — GABAPENTIN 300 MG PO CAPS
300.0000 mg | ORAL_CAPSULE | ORAL | Status: AC
  Administered 2020-09-18: 300 mg via ORAL

## 2020-09-18 MED ORDER — OXYCODONE HCL 5 MG PO TABS
ORAL_TABLET | ORAL | Status: AC
  Filled 2020-09-18: qty 2

## 2020-09-18 MED ORDER — PROMETHAZINE HCL 25 MG/ML IJ SOLN: 6.2500 mg | INTRAMUSCULAR | Status: DC | PRN

## 2020-09-18 MED ORDER — ACETAMINOPHEN 500 MG PO TABS
1000.0000 mg | ORAL_TABLET | Freq: Four times a day (QID) | ORAL | Status: DC
  Administered 2020-09-18 – 2020-09-19 (×4): 1000 mg via ORAL

## 2020-09-18 MED ORDER — HYDROMORPHONE HCL 1 MG/ML IJ SOLN
INTRAMUSCULAR | Status: AC
  Filled 2020-09-18: qty 1

## 2020-09-18 MED ORDER — KETOROLAC TROMETHAMINE 30 MG/ML IJ SOLN
30.0000 mg | Freq: Four times a day (QID) | INTRAMUSCULAR | Status: DC
  Administered 2020-09-18 – 2020-09-19 (×3): 30 mg via INTRAVENOUS

## 2020-09-18 MED ORDER — KETOROLAC TROMETHAMINE 30 MG/ML IJ SOLN: 30.0000 mg | Freq: Once | INTRAMUSCULAR | Status: DC | PRN

## 2020-09-18 MED ORDER — ACETAMINOPHEN 500 MG PO TABS
1000.0000 mg | ORAL_TABLET | ORAL | Status: AC
  Administered 2020-09-18: 1000 mg via ORAL

## 2020-09-18 MED ORDER — ACETAMINOPHEN 500 MG PO TABS
ORAL_TABLET | ORAL | Status: AC
  Filled 2020-09-18: qty 2

## 2020-09-18 MED ORDER — ROPIVACAINE HCL 5 MG/ML IJ SOLN
INTRAMUSCULAR | Status: DC | PRN
  Administered 2020-09-18: 30 mL

## 2020-09-18 MED ORDER — MIDAZOLAM HCL 2 MG/2ML IJ SOLN
INTRAMUSCULAR | Status: DC | PRN
  Administered 2020-09-18: 2 mg via INTRAVENOUS

## 2020-09-18 MED ORDER — BUPIVACAINE HCL (PF) 0.25 % IJ SOLN
INTRAMUSCULAR | Status: DC | PRN
  Administered 2020-09-18: 15 mL

## 2020-09-18 MED ORDER — OXCARBAZEPINE 300 MG PO TABS
600.0000 mg | ORAL_TABLET | Freq: Every evening | ORAL | Status: DC
  Administered 2020-09-18: 600 mg via ORAL
  Filled 2020-09-18: qty 2

## 2020-09-18 MED ORDER — POVIDONE-IODINE 10 % EX SWAB: 2.0000 "application " | Freq: Once | CUTANEOUS | Status: DC

## 2020-09-18 MED ORDER — DOCUSATE SODIUM 100 MG PO CAPS
ORAL_CAPSULE | ORAL | Status: AC
  Filled 2020-09-18: qty 1

## 2020-09-18 MED ORDER — KETOROLAC TROMETHAMINE 30 MG/ML IJ SOLN
INTRAMUSCULAR | Status: DC | PRN
  Administered 2020-09-18: 30 mg via INTRAVENOUS

## 2020-09-18 MED ORDER — HYDROMORPHONE HCL 1 MG/ML IJ SOLN
0.2000 mg | INTRAMUSCULAR | Status: DC | PRN
  Administered 2020-09-18: 0.6 mg via INTRAVENOUS
  Administered 2020-09-18: 0.25 mg via INTRAVENOUS
  Administered 2020-09-18: 0.5 mg via INTRAVENOUS
  Administered 2020-09-18 – 2020-09-19 (×2): 0.6 mg via INTRAVENOUS

## 2020-09-18 MED ORDER — AMISULPRIDE (ANTIEMETIC) 5 MG/2ML IV SOLN
10.0000 mg | Freq: Once | INTRAVENOUS | Status: AC | PRN
  Administered 2020-09-18: 10 mg via INTRAVENOUS

## 2020-09-18 MED ORDER — WHITE PETROLATUM EX OINT
TOPICAL_OINTMENT | CUTANEOUS | Status: AC
  Filled 2020-09-18: qty 5

## 2020-09-18 MED ORDER — ALPRAZOLAM 0.5 MG PO TABS
ORAL_TABLET | ORAL | Status: AC
  Filled 2020-09-18: qty 1

## 2020-09-18 MED ORDER — SCOPOLAMINE 1 MG/3DAYS TD PT72
1.0000 | MEDICATED_PATCH | TRANSDERMAL | Status: DC
  Administered 2020-09-18: 1.5 mg via TRANSDERMAL

## 2020-09-18 MED ORDER — FENTANYL CITRATE (PF) 100 MCG/2ML IJ SOLN
INTRAMUSCULAR | Status: AC
  Filled 2020-09-18: qty 2

## 2020-09-18 MED ORDER — LEVOTHYROXINE SODIUM 75 MCG PO TABS
75.0000 ug | ORAL_TABLET | Freq: Every day | ORAL | Status: DC
  Administered 2020-09-19: 75 ug via ORAL
  Filled 2020-09-18: qty 1

## 2020-09-18 MED ORDER — SUGAMMADEX SODIUM 200 MG/2ML IV SOLN
INTRAVENOUS | Status: DC | PRN
  Administered 2020-09-18: 220 mg via INTRAVENOUS

## 2020-09-18 MED ORDER — OXYCODONE HCL 5 MG PO TABS
5.0000 mg | ORAL_TABLET | ORAL | Status: DC | PRN
Start: 2020-09-18 — End: 2020-09-19
  Administered 2020-09-18 – 2020-09-19 (×5): 10 mg via ORAL

## 2020-09-18 MED ORDER — FENTANYL CITRATE (PF) 100 MCG/2ML IJ SOLN
25.0000 ug | INTRAMUSCULAR | Status: DC | PRN
  Administered 2020-09-18: 50 ug via INTRAVENOUS

## 2020-09-18 MED ORDER — OXYCODONE HCL 5 MG/5ML PO SOLN: 5.0000 mg | Freq: Once | ORAL | Status: DC | PRN

## 2020-09-18 MED ORDER — ONDANSETRON HCL 4 MG/2ML IJ SOLN
INTRAMUSCULAR | Status: DC | PRN
  Administered 2020-09-18: 4 mg via INTRAVENOUS

## 2020-09-18 MED ORDER — ENOXAPARIN SODIUM 40 MG/0.4ML IJ SOSY
40.0000 mg | PREFILLED_SYRINGE | INTRAMUSCULAR | Status: AC
  Administered 2020-09-18: 40 mg via SUBCUTANEOUS

## 2020-09-18 MED ORDER — FENTANYL CITRATE (PF) 100 MCG/2ML IJ SOLN
INTRAMUSCULAR | Status: DC | PRN
  Administered 2020-09-18: 25 ug via INTRAVENOUS
  Administered 2020-09-18 (×2): 50 ug via INTRAVENOUS
  Administered 2020-09-18 (×3): 25 ug via INTRAVENOUS

## 2020-09-18 MED ORDER — ENOXAPARIN SODIUM 40 MG/0.4ML IJ SOSY: 40.0000 mg | PREFILLED_SYRINGE | INTRAMUSCULAR | Status: DC

## 2020-09-18 MED ORDER — DEXMEDETOMIDINE (PRECEDEX) IN NS 20 MCG/5ML (4 MCG/ML) IV SYRINGE
PREFILLED_SYRINGE | INTRAVENOUS | Status: DC | PRN
  Administered 2020-09-18 (×2): 8 ug via INTRAVENOUS

## 2020-09-18 MED ORDER — SODIUM CHLORIDE 0.9 % IV SOLN
2.0000 g | INTRAVENOUS | Status: AC
  Administered 2020-09-18: 2 g via INTRAVENOUS
  Filled 2020-09-18: qty 2

## 2020-09-18 MED ORDER — SCOPOLAMINE 1 MG/3DAYS TD PT72
MEDICATED_PATCH | TRANSDERMAL | Status: AC
  Filled 2020-09-18: qty 1

## 2020-09-18 MED ORDER — LIDOCAINE HCL (PF) 2 % IJ SOLN
INTRAMUSCULAR | Status: AC
  Filled 2020-09-18: qty 5

## 2020-09-18 MED ORDER — KCL IN DEXTROSE-NACL 20-5-0.45 MEQ/L-%-% IV SOLN
INTRAVENOUS | Status: DC
  Administered 2020-09-18: 1 mL via INTRAVENOUS
  Filled 2020-09-18 (×3): qty 1000

## 2020-09-18 MED ORDER — 0.9 % SODIUM CHLORIDE (POUR BTL) OPTIME
TOPICAL | Status: DC | PRN
  Administered 2020-09-18: 500 mL

## 2020-09-18 MED ORDER — AMISULPRIDE (ANTIEMETIC) 5 MG/2ML IV SOLN
INTRAVENOUS | Status: AC
  Filled 2020-09-18: qty 4

## 2020-09-18 MED ORDER — OXYCODONE HCL 5 MG PO TABS: 5.0000 mg | ORAL_TABLET | Freq: Once | ORAL | Status: DC | PRN

## 2020-09-18 MED ORDER — GABAPENTIN 300 MG PO CAPS
ORAL_CAPSULE | ORAL | Status: AC
  Filled 2020-09-18: qty 1

## 2020-09-18 MED ORDER — ALUM & MAG HYDROXIDE-SIMETH 200-200-20 MG/5ML PO SUSP
30.0000 mL | ORAL | Status: DC | PRN
  Administered 2020-09-18: 30 mL via ORAL

## 2020-09-18 MED ORDER — PROPOFOL 10 MG/ML IV BOLUS
INTRAVENOUS | Status: DC | PRN
  Administered 2020-09-18: 200 mg via INTRAVENOUS

## 2020-09-18 MED ORDER — ONDANSETRON HCL 4 MG/2ML IJ SOLN
4.0000 mg | Freq: Four times a day (QID) | INTRAMUSCULAR | Status: DC | PRN
  Administered 2020-09-19: 4 mg via INTRAVENOUS

## 2020-09-18 MED ORDER — LACTATED RINGERS IV SOLN: INTRAVENOUS | Status: DC

## 2020-09-18 MED ORDER — MENTHOL 3 MG MT LOZG: 1.0000 | LOZENGE | OROMUCOSAL | Status: DC | PRN

## 2020-09-18 MED ORDER — SODIUM CHLORIDE (PF) 0.9 % IJ SOLN
INTRAMUSCULAR | Status: DC | PRN
  Administered 2020-09-18: 30 mL

## 2020-09-18 MED ORDER — KETOROLAC TROMETHAMINE 30 MG/ML IJ SOLN
INTRAMUSCULAR | Status: AC
  Filled 2020-09-18: qty 1

## 2020-09-18 MED ORDER — HEMOSTATIC AGENTS (NO CHARGE) OPTIME
TOPICAL | Status: DC | PRN
  Administered 2020-09-18: 1 via TOPICAL

## 2020-09-18 MED ORDER — ROCURONIUM BROMIDE 10 MG/ML (PF) SYRINGE
PREFILLED_SYRINGE | INTRAVENOUS | Status: DC | PRN
  Administered 2020-09-18 (×3): 10 mg via INTRAVENOUS
  Administered 2020-09-18: 60 mg via INTRAVENOUS

## 2020-09-18 MED ORDER — ONDANSETRON HCL 4 MG/2ML IJ SOLN
INTRAMUSCULAR | Status: AC
  Filled 2020-09-18: qty 2

## 2020-09-18 MED ORDER — MIDAZOLAM HCL 2 MG/2ML IJ SOLN
INTRAMUSCULAR | Status: AC
  Filled 2020-09-18: qty 2

## 2020-09-18 MED ORDER — ENOXAPARIN SODIUM 40 MG/0.4ML IJ SOSY
PREFILLED_SYRINGE | INTRAMUSCULAR | Status: AC
  Filled 2020-09-18: qty 0.4

## 2020-09-18 MED ORDER — SODIUM CHLORIDE 0.9 % IR SOLN
Status: DC | PRN
  Administered 2020-09-18: 1000 mL
  Administered 2020-09-18: 3000 mL

## 2020-09-18 MED ORDER — ALPRAZOLAM 0.5 MG PO TABS
0.5000 mg | ORAL_TABLET | Freq: Every evening | ORAL | Status: DC | PRN
  Administered 2020-09-18: 0.5 mg via ORAL

## 2020-09-18 MED ORDER — DEXAMETHASONE SODIUM PHOSPHATE 10 MG/ML IJ SOLN
INTRAMUSCULAR | Status: AC
  Filled 2020-09-18: qty 1

## 2020-09-18 MED ORDER — LIDOCAINE 2% (20 MG/ML) 5 ML SYRINGE
INTRAMUSCULAR | Status: DC | PRN
  Administered 2020-09-18: 60 mg via INTRAVENOUS

## 2020-09-18 MED ORDER — DEXAMETHASONE SODIUM PHOSPHATE 10 MG/ML IJ SOLN
INTRAMUSCULAR | Status: DC | PRN
  Administered 2020-09-18 (×2): 5 mg via INTRAVENOUS

## 2020-09-18 MED ORDER — DOCUSATE SODIUM 100 MG PO CAPS
100.0000 mg | ORAL_CAPSULE | Freq: Two times a day (BID) | ORAL | Status: DC
  Administered 2020-09-18: 100 mg via ORAL

## 2020-09-18 MED ORDER — ROCURONIUM BROMIDE 10 MG/ML (PF) SYRINGE
PREFILLED_SYRINGE | INTRAVENOUS | Status: AC
  Filled 2020-09-18: qty 10

## 2020-09-18 MED ORDER — LEVOTHYROXINE SODIUM 75 MCG PO TABS: 75.0000 ug | ORAL_TABLET | Freq: Every day | ORAL | Status: DC

## 2020-09-18 MED ORDER — ALUM & MAG HYDROXIDE-SIMETH 200-200-20 MG/5ML PO SUSP
ORAL | Status: AC
  Filled 2020-09-18: qty 30

## 2020-09-18 MED ORDER — ONDANSETRON HCL 4 MG PO TABS: 4.0000 mg | ORAL_TABLET | Freq: Four times a day (QID) | ORAL | Status: DC | PRN

## 2020-09-18 MED ORDER — IBUPROFEN 200 MG PO TABS: 600.0000 mg | ORAL_TABLET | Freq: Four times a day (QID) | ORAL | Status: DC

## 2020-09-18 SURGICAL SUPPLY — 72 items
ADH SKN CLS APL DERMABOND .7 (GAUZE/BANDAGES/DRESSINGS) ×3
APL ESCP 73.6OZ SRGCL (TIP) ×3
APL SRG 38 LTWT LNG FL B (MISCELLANEOUS)
APL SWBSTK 6 STRL LF DISP (MISCELLANEOUS) ×3
APPLICATOR ARISTA FLEXITIP XL (MISCELLANEOUS) IMPLANT
APPLICATOR COTTON TIP 6 STRL (MISCELLANEOUS) ×3 IMPLANT
APPLICATOR COTTON TIP 6IN STRL (MISCELLANEOUS) ×4
BLADE SURG 10 STRL SS (BLADE) IMPLANT
CABLE HIGH FREQUENCY MONO STRZ (ELECTRODE) IMPLANT
CELL SAVER LIPIGURD (MISCELLANEOUS) IMPLANT
COVER BACK TABLE 60X90IN (DRAPES) IMPLANT
COVER MAYO STAND STRL (DRAPES) ×4 IMPLANT
DECANTER SPIKE VIAL GLASS SM (MISCELLANEOUS) ×4 IMPLANT
DERMABOND ADVANCED (GAUZE/BANDAGES/DRESSINGS) ×1
DERMABOND ADVANCED .7 DNX12 (GAUZE/BANDAGES/DRESSINGS) ×3 IMPLANT
DRSG COVADERM PLUS 2X2 (GAUZE/BANDAGES/DRESSINGS) ×12 IMPLANT
DRSG OPSITE POSTOP 3X4 (GAUZE/BANDAGES/DRESSINGS) ×4 IMPLANT
DURAPREP 26ML APPLICATOR (WOUND CARE) ×4 IMPLANT
EXTRT SYSTEM ALEXIS 14CM (MISCELLANEOUS)
EXTRT SYSTEM ALEXIS 17CM (MISCELLANEOUS)
GAUZE 4X4 16PLY ~~LOC~~+RFID DBL (SPONGE) ×4 IMPLANT
GLOVE SURG ENC MOIS LTX SZ6.5 (GLOVE) ×8 IMPLANT
GLOVE SURG UNDER POLY LF SZ7 (GLOVE) ×16 IMPLANT
GOWN STRL REUS W/TWL LRG LVL3 (GOWN DISPOSABLE) ×16 IMPLANT
HARMONIC RUM II 2.5CM SILVER (DISPOSABLE)
HARMONIC RUM II 3.0CM SILVER (DISPOSABLE) ×4
HARMONIC RUM II 3.5CM SILVER (DISPOSABLE)
HARMONIC RUM II 4.0CM SILVER (DISPOSABLE)
HEMOSTAT ARISTA ABSORB 3G PWDR (HEMOSTASIS) IMPLANT
KIT TURNOVER CYSTO (KITS) ×4 IMPLANT
LIGASURE VESSEL 5MM BLUNT TIP (ELECTROSURGICAL) ×4 IMPLANT
NEEDLE INSUFFLATION 120MM (ENDOMECHANICALS) ×4 IMPLANT
PACK LAPAROSCOPY BASIN (CUSTOM PROCEDURE TRAY) ×4 IMPLANT
PACK TRENDGUARD 450 HYBRID PRO (MISCELLANEOUS) IMPLANT
POUCH LAPAROSCOPIC INSTRUMENT (MISCELLANEOUS) ×4 IMPLANT
POWDER SURGICEL 3.0 GRAM (HEMOSTASIS) ×4 IMPLANT
PROTECTOR NERVE ULNAR (MISCELLANEOUS) ×8 IMPLANT
RETRACTOR WOUND ALXS 19CM XSML (INSTRUMENTS) IMPLANT
RTRCTR WOUND ALEXIS 19CM XSML (INSTRUMENTS)
SCALPEL HRMNC RUM II 2.5 SILVR (DISPOSABLE) IMPLANT
SCALPEL HRMNC RUM II 3.0 SILVR (DISPOSABLE) ×3 IMPLANT
SCALPEL HRMNC RUM II 3.5 SILVR (DISPOSABLE) IMPLANT
SCALPEL HRMNC RUM II 4.0 SILVR (DISPOSABLE) IMPLANT
SCISSORS LAP 5X35 DISP (ENDOMECHANICALS) ×4 IMPLANT
SET IRRIG Y TYPE TUR BLADDER L (SET/KITS/TRAYS/PACK) ×4 IMPLANT
SET SUCTION IRRIG HYDROSURG (IRRIGATION / IRRIGATOR) ×4 IMPLANT
SET TRI-LUMEN FLTR TB AIRSEAL (TUBING) ×4 IMPLANT
SHEARS 1100 HARMONIC 36 (ELECTROSURGICAL) ×4 IMPLANT
SLEEVE ADV FIXATION 5X100MM (TROCAR) ×4 IMPLANT
SUT VIC AB 0 CT1 36 (SUTURE) ×4 IMPLANT
SUT VIC AB 4-0 PS2 18 (SUTURE) ×4 IMPLANT
SUT VICRYL 0 UR6 27IN ABS (SUTURE) IMPLANT
SUT VLOC 180 0 9IN  GS21 (SUTURE)
SUT VLOC 180 0 9IN GS21 (SUTURE) IMPLANT
SYR 50ML LL SCALE MARK (SYRINGE) ×8 IMPLANT
SYSTEM CONTND EXTRCTN KII BLLN (MISCELLANEOUS) IMPLANT
TIP ENDOSCOPIC SURGICEL (TIP) ×4 IMPLANT
TIP RUMI ORANGE 6.7MMX12CM (TIP) IMPLANT
TIP UTERINE 5.1X6CM LAV DISP (MISCELLANEOUS) IMPLANT
TIP UTERINE 6.7X10CM GRN DISP (MISCELLANEOUS) ×4 IMPLANT
TIP UTERINE 6.7X6CM WHT DISP (MISCELLANEOUS) IMPLANT
TIP UTERINE 6.7X8CM BLUE DISP (MISCELLANEOUS) IMPLANT
TOWEL OR 17X26 10 PK STRL BLUE (TOWEL DISPOSABLE) ×4 IMPLANT
TRAP SPECIMEN MUCUS 40CC (MISCELLANEOUS) ×4 IMPLANT
TRAY FOLEY W/BAG SLVR 14FR LF (SET/KITS/TRAYS/PACK) ×4 IMPLANT
TRENDGUARD 450 HYBRID PRO PACK (MISCELLANEOUS)
TROCAR 5M 150ML BLDLS (TROCAR) ×4 IMPLANT
TROCAR ADV FIXATION 5X100MM (TROCAR) ×4 IMPLANT
TROCAR BLADELESS OPT 5 100 (ENDOMECHANICALS) ×4 IMPLANT
TROCAR PORT AIRSEAL 5X120 (TROCAR) ×4 IMPLANT
TROCAR XCEL NON BLADE 8MM B8LT (ENDOMECHANICALS) ×4 IMPLANT
WARMER LAPAROSCOPE (MISCELLANEOUS) ×4 IMPLANT

## 2020-09-18 NOTE — Progress Notes (Signed)
Day of Surgery Procedure(s) (LRB): TOTAL LAPAROSCOPIC HYSTERECTOMY WITH BILATERAL SALPINGECTOMY (Bilateral) CYSTOSCOPY (N/A) EXTENSIVE LAPAROSCOPIC LYSIS OF ADHESIONS (N/A)  Subjective: Patient reports that she is needed dilaudid for break through pain. Ambulating, tolerating po, has only voided a small amount and doesn't feel like she needs to void.     Objective: I have reviewed patient's vital signs, intake and output, and labs.  Today's Vitals   09/18/20 1123 09/18/20 1145 09/18/20 1300 09/18/20 1730  BP: 138/87 123/83 (!) 147/95 120/83  Pulse: 79 100 98 82  Resp: 14     Temp: (!) 97.2 F (36.2 C)   98.3 F (36.8 C)  TempSrc:      SpO2: 99% 99% 99% 99%  Weight:      Height:      PainSc: 6    6    No intake/output data recorded. Total I/O In: 7543 [P.O.:480; I.V.:1850; IV Piggyback:100] Out: 1000 [Urine:900; Blood:100]  See nursing note, patient voided 50 cc, PVR 400 (foley left in)  CBC Latest Ref Rng & Units 09/18/2020 09/14/2020 06/08/2020  WBC 4.0 - 10.5 K/uL 10.5 7.7 12.9(H)  Hemoglobin 12.0 - 15.0 g/dL 12.3 13.4 13.1  Hematocrit 36.0 - 46.0 % 37.8 39.8 37.6  Platelets 150 - 400 K/uL 189 219 191     General: alert, cooperative, and no distress Resp: clear to auscultation bilaterally Cardio: S1, S2 normal GI: soft, appropriately tender, minimally distended, +BS. Incisions: clean, dry and intact without erythema Extremities: extremities normal, atraumatic, no cyanosis or edema Vaginal Bleeding: minimal  Assessment: s/p Procedure(s): TOTAL LAPAROSCOPIC HYSTERECTOMY WITH BILATERAL SALPINGECTOMY (Bilateral) CYSTOSCOPY (N/A) EXTENSIVE LAPAROSCOPIC LYSIS OF ADHESIONS (N/A): stable, tolerating diet, and urinary retention  Plan: Advance diet Foley overnight Anticipate d/c in the am  LOS: 0 days    Salvadore Dom 09/18/2020, 6:37 PM

## 2020-09-18 NOTE — Transfer of Care (Signed)
Immediate Anesthesia Transfer of Care Note  Patient: Lori Callahan  Procedure(s) Performed: Procedure(s) (LRB): TOTAL LAPAROSCOPIC HYSTERECTOMY WITH BILATERAL SALPINGECTOMY (Bilateral) CYSTOSCOPY (N/A) EXTENSIVE LAPAROSCOPIC LYSIS OF ADHESIONS (N/A)  Patient Location: PACU  Anesthesia Type: General  Level of Consciousness: awake, oriented, sedated and patient cooperative  Airway & Oxygen Therapy: Patient Spontanous Breathing and Patient connected to face mask oxygen  Post-op Assessment: Report given to PACU RN and Post -op Vital signs reviewed and stable  Post vital signs: Reviewed and stable  Complications: No apparent anesthesia complications Last Vitals:  Vitals Value Taken Time  BP 146/89 09/18/20 1017  Temp 36.4 C 09/18/20 1013  Pulse 98 09/18/20 1022  Resp 19 09/18/20 1022  SpO2 100 % 09/18/20 1022  Vitals shown include unvalidated device data.  Last Pain:  Vitals:   09/18/20 1015  TempSrc:   PainSc: 0-No pain      Patients Stated Pain Goal: 3 (54/27/06 2376)  Complications: No notable events documented.

## 2020-09-18 NOTE — Op Note (Signed)
Preoperative Diagnosis: Severe dysmenorrhea  Postoperative Diagnosis: Severe dysmenorrhea, pelvic and abdominal adhesions  Procedure:  Total Laparoscopic Hysterectomy with bilateral salpingectomies, extensive lysis of adhesions and cystoscopy  Surgeon: Dr Sumner Boast  Assistant: Dr Josefa Half, an MD assistant was necessary for tissue manipulation, retraction and positioning due to the complexity of the case and hospital policies. She specifically helped with retraction for lysis of adhesions. She took out the right tube and took down the right side of the uterus during the hysterectomy.  Anesthesia: General  EBL: 100 cc  Fluids: 1,000 cc  Urine output: 400 cc  Specimens: pelvic washings, uterus with cervix, remaining portion of both fallopian tubes  Indications for surgery: The patient is a 35 year old female, who presented with severe dysmenorrhea which has worsened since her last C/S 3 years ago. Work up included an ultrasound without concerning findings. Operative note from prior C/S without mention of significant adhesions. No improvement in dysmenorrhea with the mirena IUD, declined OCP's, nuvaring and depo-provera. She desires definitive surgery.  The patient is aware of the risks and complications involved with the surgery and consent was obtained prior to the procedure.  Findings: EUA: normal sized uterus, no adnexal masses. Laparoscopy: extensive adhesions of the omentum to the anterior abdominal wall. Normal sized uterus, normal ovaries bilaterally, evidence of prior tubal ligation. Dense adhesions of the bladder to the lower uterine segment of the uterus. Filmy adhesions between the colon and the left ovary and abdominal side wall.  Procedure: The patient was taken to the operating room with an IV in placed, preoperative antibiotics had been administered. She was placed in the dorsal lithotomy position. General anesthesia was administered. She was prepped and draped in the usual  sterile fashion for an abdominal, vaginal surgery. A rumi uterine manipulator was placed, using a # 3 cup and a 10 cm extender. A foley catheter was placed.    The umbilicus was everted, injected with 0.25% marcaine and incised with a # 11 blade. 2 towel clips were used to elevated the umbilicus and a veress needle was placed into the abdominal cavity. With insufflation of CO2 the abdominal pressure was too high so the veress needle was removed. The veress needle was reinserted with the same outcome so the veress needle was removed. Decision was made to enter the abdominal cavity with direct trocar entry. The 5 mm opti-view trocar was placed through the umbilical incision into the peritoneal cavity with direct visualization with the laparoscope.  The abdomen was insufflated with CO2. The patient was placed in trendelenburg and the abdominal pelvic cavity was inspected. Pelvic washings were obtained.   The omental adhesions needed to be taken down for all of the trocars to be placed. Two 5 mm trocars were placed in the lateral left lower to mid abdomen under direct visualization with the laparoscope. Through these trocars the camera and harmonic scalpel were used to take down the omental adhesions. 2 more trocars were then placed: 1 in the right lower quadrant approximately 3 cm medial to and superior to the anterior superior iliac spine and one in the midline approximately 6 cm above the pubic symphysis in the midline. These areas were injected with 0.25% marcaine, incised with a #11 blade and all trocars were inserted with direct visualization with the laparoscope. A # 5 airseal trocar was placed in the RLQ and a #8 trocar in the midline. The abdominal pelvic cavity was again inspected. A mixture of 30 cc of Robivacaine and 30  cc of NS was place in the pelvic cavity.   The left tube was elevated from the pelvic sidewall, cauterized and cut with the ligasure device. The remaining mesosalpinx was cauterized  and cut with the ligasure device and was removed through the midline trocar. The left uterine ovarian ligament was cauterized and cut with the ligasure device. The left round ligament was cauterized and cut with the ligasure device and the anterior and posterior leafs of the broad ligament were taken down with the ligasure device. The same procedure was repeated on the right.   There were dense adhesions of the bladder to the uterus. The bladder was back filled with 200 cc of normal saline. Using a combination of sharp dissection and use of the harmonic scalpel the bladder bladder was carefully dissected off of the lower uterine segment and cervix. The harmonic scalpel was then used to skeltonize the vessels. The left uterine vessels were then clamped, cauterized and ligated with the ligasure device. Hemostasis was excellent. The same procedure was repeated on the right.   Using the rumi manipulator the uterus was pushed up in the pelvic cavity and the harmonic scalpel was used to separate the cervix from the vagina using the harmonic energy. The uterus was  removed vaginally at this time. An occluder was placed in the vagina to maintain pneumoperitoneum. The vaginal cuff was then closed with a 0 V-lock suture. Hemostasis was excellent. The abdominal pelvic cavity was irrigated and suctioned dry. Pressure was released and hemostasis remained excellent. Surgicel powder was placed over the vaginal cuff and adnexa, after one minute the area was irrigated and suctioned dry.   The abdominal cavity was desufflated and the trocars were removed. The skin was closed with subcuticular stiches of 4-0 vicryl and dermabond was placed over the incisions.  The foley catheter was removed and cystoscopy was performed using a 70 degree scope. Both ureters expelled urine, no bladder abnormalities were noted. The bladder was allowed to drain and the cystoscope was removed.   The patient's abdomen and perineum were cleansed  and she was taken out of the dorsal lithotomy position. Upon awakening she was extubated and taken to the recovery room in stable condition. The sponge and instrument counts were correct.

## 2020-09-18 NOTE — Interval H&P Note (Signed)
History and Physical Interval Note:  09/18/2020 7:12 AM  Lori Callahan  has presented today for surgery, with the diagnosis of Severe Dysmenorrhea.  The various methods of treatment have been discussed with the patient and family. After consideration of risks, benefits and other options for treatment, the patient has consented to  Procedure(s): TOTAL LAPAROSCOPIC HYSTERECTOMY WITH SALPINGECTOMY, possible treatment of endometriosis (Bilateral) CYSTOSCOPY (N/A) as a surgical intervention.  The patient's history has been reviewed, patient examined, no change in status, stable for surgery.  I have reviewed the patient's chart and labs.  Questions were answered to the patient's satisfaction.    TSH is normal.   Salvadore Dom

## 2020-09-18 NOTE — Progress Notes (Signed)
09/18/2020 1700 Pt. With spontaneous void x 2 since foley removal at 1000. Each time 50 ml, clear yellow urine. Pt. Denies urge to void, states she feels no need to void further at this time. VSS, abdomen soft, non-distended. Pt. With good po intake and ambulating in halls. Pt. Does complain of cramping. Bladder scan done showing no residual urine in bladder post void. Dr. Talbert Nan called and made aware of findings. Verbal order to replace foley obtained. MD to bedside to evaluate patient.  [place] Dr. Talbert Nan at bedside, 14 FR foley placed, removing 400 ml amber urine from bladder. Will continue to closely monitor patient.  Lori Callahan, Arville Lime

## 2020-09-18 NOTE — Anesthesia Postprocedure Evaluation (Signed)
Anesthesia Post Note  Patient: Lori Callahan  Procedure(s) Performed: TOTAL LAPAROSCOPIC HYSTERECTOMY WITH BILATERAL SALPINGECTOMY (Bilateral: Abdomen) CYSTOSCOPY (Bladder) EXTENSIVE LAPAROSCOPIC LYSIS OF ADHESIONS (Abdomen)     Patient location during evaluation: PACU Anesthesia Type: General Level of consciousness: awake Pain management: pain level controlled Vital Signs Assessment: post-procedure vital signs reviewed and stable Respiratory status: spontaneous breathing, nonlabored ventilation, respiratory function stable and patient connected to nasal cannula oxygen Cardiovascular status: blood pressure returned to baseline and stable Postop Assessment: no apparent nausea or vomiting Anesthetic complications: no   No notable events documented.  Last Vitals:  Vitals:   09/18/20 1123 09/18/20 1145  BP: 138/87 123/83  Pulse: 79 100  Resp: 14   Temp: (!) 36.2 C   SpO2: 99% 99%    Last Pain:  Vitals:   09/18/20 1123  TempSrc:   PainSc: 6                  Zabella Wease P Sianna Garofano

## 2020-09-18 NOTE — Anesthesia Procedure Notes (Signed)
Procedure Name: Intubation Date/Time: 09/18/2020 7:38 AM Performed by: Suan Halter, CRNA Pre-anesthesia Checklist: Patient identified, Emergency Drugs available, Suction available and Patient being monitored Patient Re-evaluated:Patient Re-evaluated prior to induction Oxygen Delivery Method: Circle system utilized Preoxygenation: Pre-oxygenation with 100% oxygen Induction Type: IV induction Ventilation: Mask ventilation without difficulty Laryngoscope Size: Mac and 3 Grade View: Grade I Tube type: Oral Tube size: 7.0 mm Number of attempts: 1 Airway Equipment and Method: Stylet and Oral airway Placement Confirmation: ETT inserted through vocal cords under direct vision, positive ETCO2 and breath sounds checked- equal and bilateral Secured at: 22 cm Tube secured with: Tape Dental Injury: Teeth and Oropharynx as per pre-operative assessment

## 2020-09-19 ENCOUNTER — Encounter (HOSPITAL_BASED_OUTPATIENT_CLINIC_OR_DEPARTMENT_OTHER): Payer: Self-pay | Admitting: Obstetrics and Gynecology

## 2020-09-19 DIAGNOSIS — N946 Dysmenorrhea, unspecified: Secondary | ICD-10-CM | POA: Diagnosis not present

## 2020-09-19 LAB — SURGICAL PATHOLOGY

## 2020-09-19 MED ORDER — OXYCODONE HCL 5 MG PO TABS
ORAL_TABLET | ORAL | Status: AC
  Filled 2020-09-19: qty 2

## 2020-09-19 MED ORDER — DOCUSATE SODIUM 100 MG PO CAPS: 100.0000 mg | ORAL_CAPSULE | Freq: Two times a day (BID) | ORAL | 0 refills | Status: DC

## 2020-09-19 MED ORDER — HYDROMORPHONE HCL 1 MG/ML IJ SOLN
INTRAMUSCULAR | Status: AC
  Filled 2020-09-19: qty 1

## 2020-09-19 MED ORDER — ACETAMINOPHEN 500 MG PO TABS: 1000.0000 mg | ORAL_TABLET | Freq: Four times a day (QID) | ORAL | 0 refills | Status: DC

## 2020-09-19 MED ORDER — KETOROLAC TROMETHAMINE 30 MG/ML IJ SOLN
INTRAMUSCULAR | Status: AC
  Filled 2020-09-19: qty 1

## 2020-09-19 MED ORDER — ACETAMINOPHEN 500 MG PO TABS
ORAL_TABLET | ORAL | Status: AC
  Filled 2020-09-19: qty 2

## 2020-09-19 MED ORDER — IBUPROFEN 800 MG PO TABS: 800.0000 mg | ORAL_TABLET | Freq: Three times a day (TID) | ORAL | 0 refills | Status: DC | PRN

## 2020-09-19 MED ORDER — HYDROMORPHONE HCL 2 MG PO TABS: 2.0000 mg | ORAL_TABLET | ORAL | 0 refills | Status: DC | PRN

## 2020-09-19 MED ORDER — ONDANSETRON HCL 4 MG/2ML IJ SOLN
INTRAMUSCULAR | Status: AC
  Filled 2020-09-19: qty 2

## 2020-09-19 MED ORDER — PROMETHAZINE HCL 12.5 MG PO TABS: 12.5000 mg | ORAL_TABLET | Freq: Four times a day (QID) | ORAL | 0 refills | Status: DC | PRN

## 2020-09-19 NOTE — Discharge Summary (Signed)
Physician Discharge Summary  Patient ID: Lori Callahan MRN: [id number] DOB/AGE: 1985-12-02 35 y.o.  Admit date: 09/18/2020 Discharge date: 09/19/2020  Admission Diagnoses: Severe dysmenorrhea  Discharge Diagnoses:  Active Problems:   S/P laparoscopic hysterectomy   Discharged Condition: good  Hospital Course: Uncomplicated  Consults: None  Significant Diagnostic Studies: labs:  Lab Results  Component Value Date   WBC 10.5 09/18/2020   HGB 12.3 09/18/2020   HCT 37.8 09/18/2020   MCV 94.0 09/18/2020   PLT 189 09/18/2020   Today's Vitals   09/18/20 2345 09/19/20 0145 09/19/20 0500 09/19/20 0640  BP:  103/72 99/67   Pulse:  76 80   Resp:  16 16   Temp:  97.7 F (36.5 C) 98.6 F (37 C)   TempSrc:      SpO2:  99% 100%   Weight:      Height:      PainSc: 6  6  6   Asleep   I/O last 3 completed shifts: In: 3765 [P.O.:1440; I.V.:2225; IV Piggyback:100] Out: 2225 [Urine:2125; Blood:100] Total I/O In: -  Out: 200 [Urine:200]    Treatments: surgery: total laparoscopic hysterectomy, bilateral salpingectomies, extensive lysis of adhesions, cystoscopy  Subjective: ambulating, tolerating po, she had some nausea this am which is not uncommon for her. Pain is best controlled with dilaudid, requests dilaudid po on discharge. Foley was removed this am, hasn't voided yet.   Discharge Exam: Blood pressure 99/67, pulse 80, temperature 98.6 F (37 C), resp. rate 16, height 5\' 5"  (1.651 m), weight 105.1 kg, last menstrual period 09/04/2020, SpO2 100 %. General appearance: alert, cooperative, and no distress Resp: clear to auscultation bilaterally Cardio: S1, S2 normal GI: soft, appropriately tender, not distended, NABS Extremities: extremities normal, atraumatic, no cyanosis or edema Incision/Wound:Clean, dry and intact without erythema  Disposition: Discharge disposition: 01-Home or Self Care       Discharge Instructions     Call MD for:   Complete by: As directed     Heavy vaginal bleeding   Call MD for:  difficulty breathing, headache or visual disturbances   Complete by: As directed    Call MD for:  extreme fatigue   Complete by: As directed    Call MD for:  hives   Complete by: As directed    Call MD for:  persistant dizziness or light-headedness   Complete by: As directed    Call MD for:  persistant nausea and vomiting   Complete by: As directed    Call MD for:  redness, tenderness, or signs of infection (pain, swelling, redness, odor or green/yellow discharge around incision site)   Complete by: As directed    Call MD for:  severe uncontrolled pain   Complete by: As directed    Call MD for:  temperature >100.4   Complete by: As directed    Diet - low sodium heart healthy   Complete by: As directed    Driving Restrictions   Complete by: As directed    No driving while taking dilaudid or until you can slam on the brakes of your car   Increase activity slowly   Complete by: As directed    No dressing needed   Complete by: As directed    Sexual Activity Restrictions   Complete by: As directed    No intercourse for 12 weeks      Allergies as of 09/19/2020       Reactions   Reglan [metoclopramide] Shortness Of Breath  Medication List     TAKE these medications    acetaminophen 500 MG tablet Commonly known as: TYLENOL Take 2 tablets (1,000 mg total) by mouth every 6 (six) hours.   ALPRAZolam 0.5 MG tablet Commonly known as: Xanax Take 1 tablet (0.5 mg total) by mouth at bedtime as needed for anxiety.   amphetamine-dextroamphetamine 5 MG tablet Commonly known as: ADDERALL Take 5 mg by mouth daily.   docusate sodium 100 MG capsule Commonly known as: COLACE Take 1 capsule (100 mg total) by mouth 2 (two) times daily.   HYDROmorphone 2 MG tablet Commonly known as: Dilaudid Take 1 tablet (2 mg total) by mouth every 4 (four) hours as needed.   ibuprofen 800 MG tablet Commonly known as: ADVIL Take 1 tablet (800 mg  total) by mouth every 8 (eight) hours as needed.   levothyroxine 75 MCG tablet Commonly known as: SYNTHROID Take 1 tablet (75 mcg total) by mouth daily.   Oxcarbazepine 300 MG tablet Commonly known as: TRILEPTAL Take 600 mg by mouth every evening.   promethazine 12.5 MG tablet Commonly known as: PHENERGAN Take 1 tablet (12.5 mg total) by mouth every 6 (six) hours as needed for nausea or vomiting.   propranolol 40 MG tablet Commonly known as: INDERAL Take by mouth 2 (two) times daily. ORDERED TID TAKES BID               Discharge Care Instructions  (From admission, onward)           Start     Ordered   09/19/20 0000  No dressing needed        09/19/20 0757             Signed: Salvadore Dom 09/19/2020, 7:58 AM

## 2020-09-20 LAB — CYTOLOGY - NON PAP

## 2020-09-25 ENCOUNTER — Other Ambulatory Visit: Payer: Self-pay

## 2020-09-25 ENCOUNTER — Encounter: Payer: Self-pay | Admitting: Physician Assistant

## 2020-09-25 ENCOUNTER — Encounter: Payer: Self-pay | Admitting: Obstetrics and Gynecology

## 2020-09-25 ENCOUNTER — Ambulatory Visit (INDEPENDENT_AMBULATORY_CARE_PROVIDER_SITE_OTHER): Payer: Commercial Managed Care - PPO | Admitting: Obstetrics and Gynecology

## 2020-09-25 VITALS — BP 100/72 | HR 93 | Ht 65.0 in | Wt 232.0 lb

## 2020-09-25 DIAGNOSIS — Z90710 Acquired absence of both cervix and uterus: Secondary | ICD-10-CM
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
## 2020-09-26 ENCOUNTER — Encounter: Payer: Self-pay | Admitting: Obstetrics and Gynecology

## 2020-10-05 ENCOUNTER — Encounter: Payer: Self-pay | Admitting: Physician Assistant

## 2020-10-05 ENCOUNTER — Ambulatory Visit (INDEPENDENT_AMBULATORY_CARE_PROVIDER_SITE_OTHER): Payer: Commercial Managed Care - PPO | Admitting: Physician Assistant

## 2020-10-05 ENCOUNTER — Other Ambulatory Visit: Payer: Self-pay

## 2020-10-05 VITALS — BP 114/81 | HR 85 | Temp 97.7°F | Ht 65.0 in | Wt 230.0 lb

## 2020-10-05 DIAGNOSIS — F3181 Bipolar II disorder: Secondary | ICD-10-CM

## 2020-10-05 DIAGNOSIS — F419 Anxiety disorder, unspecified: Secondary | ICD-10-CM | POA: Diagnosis not present

## 2020-10-05 DIAGNOSIS — F908 Attention-deficit hyperactivity disorder, other type: Secondary | ICD-10-CM

## 2020-10-05 MED ORDER — CLONAZEPAM 0.5 MG PO TABS: ORAL_TABLET | ORAL | 1 refills | Status: DC
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
## 2020-10-05 NOTE — Progress Notes (Deleted)
   Lori Callahan is a 35 y.o. female who presents today for a virtual office visit.  Assessment/Plan:  New/Acute Problems: ***  Chronic Problems Addressed Today: No problem-specific Assessment & Plan notes found for this encounter.     Subjective:  HPI:  ***        Objective/Observations  Physical Exam: Gen: NAD, resting comfortably*** Pulm: Normal work of breathing Neuro: Grossly normal, moves all extremities Psych: Normal affect and thought content  Virtual Visit via Video   I connected with Lori Callahan on 10/05/20 at  3:00 PM EDT by a video enabled telemedicine application and verified that I am speaking with the correct person using two identifiers. The limitations of evaluation and management by telemedicine and the availability of in person appointments were discussed. The patient expressed understanding and agreed to proceed.   Patient location: Home Provider location: [place] participating in the virtual visit: Myself and ***Patient     chondromalacia patella

## 2020-10-05 NOTE — Patient Instructions (Signed)
You will be contacted when [facility] paperwork is completed.

## 2020-10-09 ENCOUNTER — Inpatient Hospital Stay: Admission: RE | Admit: 2020-10-09 | Payer: Commercial Managed Care - PPO | Source: Ambulatory Visit

## 2020-10-11 ENCOUNTER — Encounter: Payer: Self-pay | Admitting: Physician Assistant

## 2020-10-11 NOTE — Telephone Encounter (Signed)
Spoke with Danyiah forms were completed and faxed

## 2020-10-16 ENCOUNTER — Ambulatory Visit (INDEPENDENT_AMBULATORY_CARE_PROVIDER_SITE_OTHER): Payer: Commercial Managed Care - PPO | Admitting: Obstetrics and Gynecology

## 2020-10-16 ENCOUNTER — Encounter: Payer: Self-pay | Admitting: Obstetrics and Gynecology

## 2020-10-16 ENCOUNTER — Other Ambulatory Visit: Payer: Self-pay

## 2020-10-16 VITALS — BP 122/74 | HR 87 | Ht 65.0 in | Wt 232.0 lb

## 2020-10-16 DIAGNOSIS — Z90710 Acquired absence of both cervix and uterus: Secondary | ICD-10-CM
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
## 2020-11-16 ENCOUNTER — Inpatient Hospital Stay: Admission: RE | Admit: 2020-11-16 | Payer: Self-pay | Source: Ambulatory Visit

## 2020-11-30 ENCOUNTER — Inpatient Hospital Stay: Admission: RE | Admit: 2020-11-30 | Payer: Self-pay | Source: Ambulatory Visit

## 2020-12-12 ENCOUNTER — Telehealth: Payer: Commercial Managed Care - PPO | Admitting: Physician Assistant

## 2020-12-12 DIAGNOSIS — F430 Acute stress reaction: Secondary | ICD-10-CM | POA: Diagnosis not present

## 2020-12-12 NOTE — Progress Notes (Signed)
Virtual Visit Consent   Lori Callahan, you are scheduled for a virtual visit with a [facility] provider today.     Just as with appointments in the office, your consent must be obtained to participate.  Your consent will be active for this visit and any virtual visit you may have with one of our providers in the next 365 days.     If you have a MyChart account, a copy of this consent can be sent to you electronically.  All virtual visits are billed to your insurance company just like a traditional visit in the office.    As this is a virtual visit, video technology does not allow for your provider to perform a traditional examination.  This may limit your provider's ability to fully assess your condition.  If your provider identifies any concerns that need to be evaluated in person or the need to arrange testing (such as labs, EKG, etc.), we will make arrangements to do so.     Although advances in technology are sophisticated, we cannot ensure that it will always work on either your end or our end.  If the connection with a video visit is poor, the visit may have to be switched to a telephone visit.  With either a video or telephone visit, we are not always able to ensure that we have a secure connection.     I need to obtain your verbal consent now.   Are you willing to proceed with your visit today?    PRAGYA LOFASO has provided verbal consent on 12/12/2020 for a virtual visit (video or telephone).   Leeanne Rio, [place]   Date: 12/12/2020 1:14 PM  Virtual Visit via Video Note   I, Leeanne Rio, connected with  Lori Callahan  ([id number], November 20, 1985) on 12/12/20 at 12:15 PM EDT by a video-enabled telemedicine application and verified that I am speaking with the correct person using two identifiers.  Location: Patient: Virtual Visit Location Patient: Home Provider: Virtual Visit Location Provider: Home Office   I discussed the limitations of evaluation and management by  telemedicine and the availability of in person appointments. The patient expressed understanding and agreed to proceed.    History of Present Illness: Lori Callahan is a 35 y.o. who identifies as a female who was assigned female at birth, and is being seen today for possible exacerbation of her anxiety/mood due to situation at work. Has history of BP II and anxiety, currently followed by psychiatry. Is currently on Trileptal, Klonopin and Adderall. Notes she has been doing well with this regimen overall until today. Notes there was a situation at work in which she was unable to help someone and she has been berating herself for this to the point of tears. States she is so frustrated that she cannot stop crying. Took a Klonopin this morning which has not helped to calm her down. Denies any manic behavior. Denies SI/HI. Is managed with counseling through [facility] -- is seeing them daily at present. Reached out to them this morning but have not been able to see her so far today. States she just needed someone to talk to about how she was feeling.Marland Kitchen  HPI: HPI  Problems:  Patient Active Problem List   Diagnosis Date Noted   S/P laparoscopic hysterectomy 09/18/2020   MRSA (methicillin resistant Staphylococcus aureus) infection 06/21/2020   Acquired hypothyroidism 06/09/2020   Nevus of toe of left foot 05/02/2020   Aortic atherosclerosis ([place])  04/11/2020   Malignant melanoma of left foot ([place]) 03/17/2020   Bipolar 2 disorder ([place]) 05/25/2019   Anxiety 05/25/2019    Allergies:  Allergies  Allergen Reactions   Reglan [Metoclopramide] Shortness Of Breath   Medications:  Current Outpatient Medications:    amphetamine-dextroamphetamine (ADDERALL) 5 MG tablet, Take 5 mg by mouth daily., Disp: , Rfl:    clonazePAM (KLONOPIN) 0.5 MG tablet, Take 1 tab po qhs prn anxiety., Disp: 30 tablet, Rfl: 1   levothyroxine (SYNTHROID) 75 MCG tablet, Take 1 tablet (75 mcg total) by mouth daily., Disp: 90 tablet,  Rfl: 1   Oxcarbazepine (TRILEPTAL) 300 MG tablet, Take 600 mg by mouth every evening., Disp: , Rfl:    propranolol (INDERAL) 40 MG tablet, Take by mouth 2 (two) times daily. ORDERED TID TAKES BID, Disp: , Rfl:   Observations/Objective: Patient is well-developed, well-nourished in no acute distress.  Resting comfortably at home.  Head is normocephalic, atraumatic.  No labored breathing. Speech is clear and coherent with logical content.  Patient is alert and oriented at baseline.   Assessment and Plan: 1. Acute stress reaction Causing increased anxiety and tearfulness. No alarm signs/symptoms. Does not seem indicative of any manic or hypomanic episodes. Reassurance given to patient. Discussed deep breathing, mindfulness training and sleep hygiene practices. Resources given. She is going to continue to reach out to counseling for an appointment as well. Discussed if ongoing symptoms she needs to contact her specialist or PCP. She voiced understanding. Work note written for today.  Follow Up Instructions: I discussed the assessment and treatment plan with the patient. The patient was provided an opportunity to ask questions and all were answered. The patient agreed with the plan and demonstrated an understanding of the instructions.  A copy of instructions were sent to the patient via MyChart unless otherwise noted below.    The patient was advised to call back or seek an in-person evaluation if the symptoms worsen or if the condition fails to improve as anticipated.  Time:  I spent 15 minutes with the patient via telehealth technology discussing the above problems/concerns.    Leeanne Rio, PA-C

## 2020-12-12 NOTE — Patient Instructions (Addendum)
Bonney Leitz, thank you for joining Leeanne Rio, PA-C for today's virtual visit.  While this provider is not your primary care provider (PCP), if your PCP is located in our provider database this encounter information will be shared with them immediately following your visit.  Consent: (Patient) Lori Callahan provided verbal consent for this virtual visit at the beginning of the encounter.  Current Medications:  Current Outpatient Medications:    amphetamine-dextroamphetamine (ADDERALL) 5 MG tablet, Take 5 mg by mouth daily., Disp: , Rfl:    clonazePAM (KLONOPIN) 0.5 MG tablet, Take 1 tab po qhs prn anxiety., Disp: 30 tablet, Rfl: 1   levothyroxine (SYNTHROID) 75 MCG tablet, Take 1 tablet (75 mcg total) by mouth daily., Disp: 90 tablet, Rfl: 1   Oxcarbazepine (TRILEPTAL) 300 MG tablet, Take 600 mg by mouth every evening., Disp: , Rfl:    propranolol (INDERAL) 40 MG tablet, Take by mouth 2 (two) times daily. ORDERED TID TAKES BID, Disp: , Rfl:    Medications ordered in this encounter:  No orders of the defined types were placed in this encounter.    *If you need refills on other medications prior to your next appointment, please contact your pharmacy*  Follow-Up: Call back or seek an in-person evaluation if the symptoms worsen or if the condition fails to improve as anticipated.  Other Instructions Please continue taking your regular medications as directed by your specialists. Make sure to keep follow-ups with therapy and your psychiatrist. I am sending in the Hydroxyzine for you to take as directed today to help calm things down further.  Try to download the Calm or [employer] app on your phone to begin mindfulness training techniques.  Make sure to follow the sleep hygiene practice below to help you get more restful sleep as this has a big impact on mood. If things are not improving and your psychiatrist is unable to work you in, I want you to contact us or your primary  care provider.  Remember to give yourself all the love and compassion you would give to a friend or family member! You do make a difference at your job and for the patients you care for even if you can't always see it!!  Sleep Hygiene  Do: (1) Go to bed at the same time each day. (2) Get up from bed at the same time each day. (3) Get regular exercise each day, preferably in the morning.  There is goof evidence that regular exercise improves restful sleep.  This includes stretching and aerobic exercise. (4) Get regular exposure to outdoor or bright lights, especially in the late afternoon. (5) Keep the temperature in your bedroom comfortable. (6) Keep the bedroom quiet when sleeping. (7) Keep the bedroom dark enough to facilitate sleep. (8) Use your bed only for sleep and sex. (9) Take medications as directed.  It is helpful to take prescribed sleeping pills 1 hour before bedtime, so they are causing drowsiness when you lie down, or 10 hours before getting up, to avoid daytime drowsiness. (10) Use a relaxation exercise just before going to sleep -- imagery, massage, warm bath. (11) Keep your feet and hands warm.  Wear warm socks and/or mittens or gloves to bed.  Don't: (1) Exercise just before going to bed. (2) Engage in stimulating activity just before bed, such as playing a competitive game, watching an exciting program on television, or having an important discussion with a loved one. (3) Have caffeine in the evening (coffee, teas, chocolate,  sodas, etc.) (4) Read or watch television in bed. (5) Use alcohol to help you sleep. (6) Go to bed too hungry or too full. (7) Take another person's sleeping pills. (8) Take over-the-counter sleeping pills, without your doctor's knowledge.  Tolerance can develop rapidly with these medications.  Diphenhydramine can have serious side effects for elderly patients. (9) Take daytime naps. (10) Command yourself to go to sleep.  This only makes your  mind and body more alert.  If you lie awake for more than 20-30 minutes, get up, go to a different room, participate in a quiet activity (Ex - non-excitable reading or television), and then return to bed when you feel sleepy.  Do this as many times during the night as needed.  This may cause you to have a night or two of poor sleep but it will train your brain to know when it is time for sleep.   If you have been instructed to have an in-person evaluation today at a local Urgent Care facility, please use the link below. It will take you to a list of all of our available [facility], including address, phone number and hours of operation. Please do not delay care.  [facility]  If you or a family member do not have a primary care provider, use the link below to schedule a visit and establish care. When you choose a [facility] primary care physician or advanced practice provider, you gain a long-term partner in health. Find a Primary Care Provider  Learn more about [facility]'s in-office and virtual care options: [facility]

## 2020-12-18 ENCOUNTER — Other Ambulatory Visit: Payer: Commercial Managed Care - PPO

## 2021-01-08 ENCOUNTER — Other Ambulatory Visit: Payer: Self-pay | Admitting: Physician Assistant

## 2021-01-18 ENCOUNTER — Ambulatory Visit
Admission: RE | Admit: 2021-01-18 | Discharge: 2021-01-18 | Disposition: A | Discharge status: Home / Self Care | Payer: Commercial Managed Care - PPO | Source: Ambulatory Visit | Attending: Nurse Practitioner | Admitting: Nurse Practitioner

## 2021-01-18 ENCOUNTER — Other Ambulatory Visit: Payer: Self-pay | Admitting: Nurse Practitioner

## 2021-01-18 DIAGNOSIS — N6320 Unspecified lump in the left breast, unspecified quadrant: Secondary | ICD-10-CM

## 2021-01-28 ENCOUNTER — Other Ambulatory Visit: Payer: Self-pay | Admitting: Physician Assistant

## 2021-01-29 ENCOUNTER — Other Ambulatory Visit: Payer: Self-pay | Admitting: Physician Assistant

## 2021-02-05 ENCOUNTER — Telehealth: Payer: Self-pay | Admitting: Obstetrics and Gynecology

## 2021-02-05 DIAGNOSIS — Z8481 Family history of carrier of genetic disease: Secondary | ICD-10-CM

## 2021-02-05 NOTE — Telephone Encounter (Signed)
Please let the patient know that I have seen her mammogram report, she needs a f/u left diagnostic mammogram and U/S in 6 months. Her mother is BRCA +. I previously placed a refer to the genetic center. The radiologist also recommended consultation with the Genetic Counselor. Unless she went outside of [facility], I don't see that she has gone yet. Please discuss this with her and if she is willing to go, please place another referral.

## 2021-02-06 NOTE — Telephone Encounter (Signed)
Patient informed with below, referral placed at [facility] genetic center they will call to scheduled.

## 2021-02-06 NOTE — Telephone Encounter (Signed)
Left message for patient to call.

## 2021-02-08 ENCOUNTER — Ambulatory Visit (INDEPENDENT_AMBULATORY_CARE_PROVIDER_SITE_OTHER): Payer: Commercial Managed Care - PPO | Admitting: Physician Assistant

## 2021-02-08 ENCOUNTER — Other Ambulatory Visit: Payer: Self-pay

## 2021-02-08 ENCOUNTER — Telehealth: Payer: Self-pay | Admitting: Genetic Counselor

## 2021-02-08 VITALS — BP 124/76 | HR 90 | Temp 98.2°F | Ht 65.0 in | Wt 233.2 lb

## 2021-02-08 DIAGNOSIS — C4372 Malignant melanoma of left lower limb, including hip: Secondary | ICD-10-CM

## 2021-02-08 DIAGNOSIS — D2261 Melanocytic nevi of right upper limb, including shoulder: Secondary | ICD-10-CM

## 2021-02-08 DIAGNOSIS — D485 Neoplasm of uncertain behavior of skin: Secondary | ICD-10-CM

## 2021-02-08 NOTE — Telephone Encounter (Signed)
Pt called in to sch appt from 11/29 referral. She is aware of appt date and time. She is also aware to arrive 15 mins prior to appt time.

## 2021-02-08 NOTE — Telephone Encounter (Signed)
Patient scheduled on 02/22/21
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
## 2021-02-12 LAB — ANATOMIC PATHOLOGY REPORT

## 2021-02-21 ENCOUNTER — Encounter (HOSPITAL_BASED_OUTPATIENT_CLINIC_OR_DEPARTMENT_OTHER): Payer: Self-pay

## 2021-02-21 ENCOUNTER — Emergency Department (HOSPITAL_BASED_OUTPATIENT_CLINIC_OR_DEPARTMENT_OTHER)
Admission: EM | Admit: 2021-02-21 | Discharge: 2021-02-21 | Disposition: A | Discharge status: Home / Self Care | Payer: Commercial Managed Care - PPO | Attending: Emergency Medicine | Admitting: Emergency Medicine

## 2021-02-21 ENCOUNTER — Emergency Department (HOSPITAL_BASED_OUTPATIENT_CLINIC_OR_DEPARTMENT_OTHER): Payer: Commercial Managed Care - PPO

## 2021-02-21 ENCOUNTER — Other Ambulatory Visit: Payer: Self-pay

## 2021-02-21 DIAGNOSIS — Z8616 Personal history of COVID-19: Secondary | ICD-10-CM | POA: Diagnosis not present

## 2021-02-21 DIAGNOSIS — R0789 Other chest pain: Secondary | ICD-10-CM | POA: Insufficient documentation

## 2021-02-21 DIAGNOSIS — Z87891 Personal history of nicotine dependence: Secondary | ICD-10-CM | POA: Insufficient documentation

## 2021-02-21 DIAGNOSIS — R0602 Shortness of breath: Secondary | ICD-10-CM | POA: Diagnosis not present

## 2021-02-21 DIAGNOSIS — Z79899 Other long term (current) drug therapy: Secondary | ICD-10-CM | POA: Insufficient documentation

## 2021-02-21 DIAGNOSIS — Z85820 Personal history of malignant melanoma of skin: Secondary | ICD-10-CM | POA: Insufficient documentation

## 2021-02-21 DIAGNOSIS — R202 Paresthesia of skin: Secondary | ICD-10-CM | POA: Insufficient documentation

## 2021-02-21 DIAGNOSIS — E039 Hypothyroidism, unspecified: Secondary | ICD-10-CM | POA: Diagnosis not present

## 2021-02-21 DIAGNOSIS — R42 Dizziness and giddiness: Secondary | ICD-10-CM | POA: Diagnosis not present

## 2021-02-21 LAB — D-DIMER, QUANTITATIVE: D-Dimer, Quant: 0.38 ug/mL-FEU (ref 0.00–0.50)

## 2021-02-21 LAB — BASIC METABOLIC PANEL
Anion gap: 7 (ref 5–15)
BUN: 7 mg/dL (ref 6–20)
CO2: 22 mmol/L (ref 22–32)
Calcium: 8.5 mg/dL — ABNORMAL LOW (ref 8.9–10.3)
Chloride: 106 mmol/L (ref 98–111)
Creatinine, Ser: 0.72 mg/dL (ref 0.44–1.00)
GFR, Estimated: >60 mL/min (ref >60)
Glucose, Bld: 89 mg/dL (ref 70–99)
Potassium: 4.1 mmol/L (ref 3.5–5.1)
Sodium: 135 mmol/L (ref 135–145)

## 2021-02-21 LAB — CBC WITH DIFFERENTIAL/PLATELET
Abs Immature Granulocytes: 0.01 10*3/uL (ref 0.00–0.07)
Basophils Absolute: 0 10*3/uL (ref 0.0–0.1)
Basophils Relative: 1 %
Eosinophils Absolute: 0.1 10*3/uL (ref 0.0–0.5)
Eosinophils Relative: 2 %
HCT: 36.7 % (ref 36.0–46.0)
Hemoglobin: 12.7 g/dL (ref 12.0–15.0)
Immature Granulocytes: 0 %
Lymphocytes Relative: 37 %
Lymphs Abs: 2.3 10*3/uL (ref 0.7–4.0)
MCH: 30.9 pg (ref 26.0–34.0)
MCHC: 34.6 g/dL (ref 30.0–36.0)
MCV: 89.3 fL (ref 80.0–100.0)
Monocytes Absolute: 0.4 10*3/uL (ref 0.1–1.0)
Monocytes Relative: 7 %
Neutro Abs: 3.4 10*3/uL (ref 1.7–7.7)
Neutrophils Relative %: 53 %
Platelets: 202 10*3/uL (ref 150–400)
RBC: 4.11 MIL/uL (ref 3.87–5.11)
RDW: 12.8 % (ref 11.5–15.5)
WBC: 6.3 10*3/uL (ref 4.0–10.5)
nRBC: 0 % (ref 0.0–0.2)

## 2021-02-21 LAB — TSH: TSH: 5.565 u[IU]/mL — ABNORMAL HIGH (ref 0.350–4.500)

## 2021-02-21 LAB — BRAIN NATRIURETIC PEPTIDE: B Natriuretic Peptide: 48.3 pg/mL (ref 0.0–100.0)

## 2021-02-21 LAB — TROPONIN I (HIGH SENSITIVITY): Troponin I (High Sensitivity): <2 ng/L (ref <18)

## 2021-02-21 NOTE — ED Triage Notes (Signed)
States was dx with aortic atherosclerosis earlier this year. States has been intermittently feeling SOB, "clammy", fingers tingling, dizzy. Worse today, some pain in left chest.

## 2021-02-21 NOTE — Discharge Instructions (Addendum)
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
## 2021-02-22 ENCOUNTER — Other Ambulatory Visit: Payer: Self-pay | Admitting: Genetic Counselor

## 2021-02-22 ENCOUNTER — Telehealth: Payer: Commercial Managed Care - PPO | Admitting: Physician Assistant

## 2021-02-22 ENCOUNTER — Inpatient Hospital Stay: Payer: Commercial Managed Care - PPO

## 2021-02-22 ENCOUNTER — Encounter: Payer: Self-pay | Admitting: Genetic Counselor

## 2021-02-22 ENCOUNTER — Inpatient Hospital Stay: Payer: Commercial Managed Care - PPO | Attending: Genetic Counselor | Admitting: Genetic Counselor

## 2021-02-22 DIAGNOSIS — Z803 Family history of malignant neoplasm of breast: Secondary | ICD-10-CM

## 2021-02-22 DIAGNOSIS — C439 Malignant melanoma of skin, unspecified: Secondary | ICD-10-CM

## 2021-02-22 DIAGNOSIS — C4372 Malignant melanoma of left lower limb, including hip: Secondary | ICD-10-CM | POA: Diagnosis not present

## 2021-02-22 LAB — GENETIC SCREENING ORDER
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
## 2021-03-01 ENCOUNTER — Ambulatory Visit: Payer: Commercial Managed Care - PPO | Admitting: Obstetrics and Gynecology

## 2021-03-08 ENCOUNTER — Ambulatory Visit: Payer: Commercial Managed Care - PPO | Admitting: Physician Assistant

## 2021-03-13 ENCOUNTER — Encounter: Payer: Self-pay | Admitting: Genetic Counselor

## 2021-03-13 ENCOUNTER — Other Ambulatory Visit: Payer: Self-pay | Admitting: Physician Assistant

## 2021-03-13 DIAGNOSIS — Z1379 Encounter for other screening for genetic and chromosomal anomalies: Secondary | ICD-10-CM | POA: Insufficient documentation

## 2021-03-14 ENCOUNTER — Telehealth: Payer: Self-pay | Admitting: Genetic Counselor

## 2021-03-14 MED ORDER — CLONAZEPAM 0.5 MG PO TABS: ORAL_TABLET | ORAL | 0 refills | Status: DC

## 2021-03-14 NOTE — Telephone Encounter (Signed)
LM on VM that results are back and to please call. 

## 2021-03-15 ENCOUNTER — Ambulatory Visit: Payer: Self-pay | Admitting: Genetic Counselor

## 2021-03-15 ENCOUNTER — Telehealth: Payer: Self-pay | Admitting: Genetic Counselor

## 2021-03-15 DIAGNOSIS — Z1379 Encounter for other screening for genetic and chromosomal anomalies: Secondary | ICD-10-CM

## 2021-03-15 NOTE — Telephone Encounter (Signed)
Revealed negative genetic testing.  Discussed that we do not know why she has melanoma or why there is cancer in the family. It could be due to a different gene that we are not testing, or maybe our current technology may not be able to pick something up.  It will be important for her to keep in contact with genetics to keep up with whether additional testing may be needed.  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

## 2021-03-15 NOTE — Progress Notes (Signed)
HPI:  Ms. Follansbee was previously seen in the [facility] due to a personal and family history of cancer and concerns regarding a hereditary predisposition to cancer. Please refer to our prior cancer genetics clinic note for more information regarding our discussion, assessment and recommendations, at the time. Ms. Bolinger's recent genetic test results were disclosed to her, as were recommendations warranted by these results. These results and recommendations are discussed in more detail below.  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  BRCA1 c.640G>A VUS identified.  The report date is March 08, 2021.  The CancerNext-Expanded gene panel offered by [facility] and includes sequencing and rearrangement analysis for the following 77 genes: AIP, ALK, APC*, ATM*, AXIN2, BAP1, BARD1, BLM, BMPR1A, BRCA1*, BRCA2*, BRIP1*, CDC73, CDH1*, CDK4, CDKN1B, CDKN2A, CHEK2*, CTNNA1, DICER1, FANCC, FH, FLCN, GALNT12, KIF1B, LZTR1, MAX, MEN1, MET, MLH1*, MSH2*, MSH3, MSH6*, MUTYH*, NBN, NF1*, NF2, NTHL1, PALB2*, PHOX2B, PMS2*, POT1, PRKAR1A, PTCH1, PTEN*, RAD51C*, RAD51D*, RB1, RECQL, RET, SDHA, SDHAF2, SDHB, SDHC, SDHD, SMAD4, SMARCA4, SMARCB1, SMARCE1, STK11, SUFU, TMEM127, TP53*, TSC1, TSC2, VHL and XRCC2 (sequencing and deletion/duplication); EGFR, EGLN1, HOXB13, KIT, MITF, PDGFRA, POLD1, and POLE (sequencing only); EPCAM and GREM1 (deletion/duplication only). DNA and RNA analyses performed for * genes.      FAMILY HISTORY:  We obtained a detailed, 4-generation family history.  Significant diagnoses are listed below: Family History  Problem Relation Age of Onset   Skin cancer Mother        basal cell   Brain cancer Paternal Aunt        ? meningioma   Non-Hodgkin's lymphoma Paternal  Uncle    Breast cancer Maternal Grandmother        x2   Leukemia Maternal Grandmother    Lung cancer Paternal Grandmother    Lung cancer Paternal Grandfather       The patient has two sons and a daughter who are cancer free.  She has one brother who is cancer free. Her father is deceased and her mother is living.  Her mother has had skin cancer and reportedly tested positive for an ovarian cancer gene for which she had a complete hysterectomy and double mastectomy.  Her mother has one brother who is cancer free.  The maternal grandmother had breast cancer in her 72's and again at 20 and later died of leukemia.  She also reportedly tested positive for this same gene.  The grandfather is living.   The patient's father died by suicide at 7.  He had a brother and sister.  The brother died of NHL and the sister had a benign brain tumor.  The paternal grandparents are both deceased.  They each had lung cancer.   Ms. Goodrich is aware of previous family history of genetic testing for hereditary cancer risks. Patient's maternal ancestors are of Caucasian descent, and paternal ancestors are of [place] descent. There is no reported Ashkenazi Jewish ancestry. There is no known consanguinity  GENETIC TEST RESULTS: Genetic testing reported out on March 08, 2021 through the CancerNext-Expanded+RNAinsight cancer panel found no pathogenic mutations. The CancerNext-Expanded gene panel offered by [facility] and includes sequencing and rearrangement analysis for the following 77 genes: AIP, ALK, APC*, ATM*, AXIN2, BAP1, BARD1, BLM, BMPR1A, BRCA1*, BRCA2*, BRIP1*, CDC73, CDH1*, CDK4, CDKN1B, CDKN2A, CHEK2*, CTNNA1, DICER1, FANCC, FH,  FLCN, GALNT12, KIF1B, LZTR1, MAX, MEN1, MET, MLH1*, MSH2*, MSH3, MSH6*, MUTYH*, NBN, NF1*, NF2, NTHL1, PALB2*, PHOX2B, PMS2*, POT1, PRKAR1A, PTCH1, PTEN*, RAD51C*, RAD51D*, RB1, RECQL, RET, SDHA, SDHAF2, SDHB, SDHC, SDHD, SMAD4, SMARCA4, SMARCB1, SMARCE1, STK11, SUFU, TMEM127, TP53*,  TSC1, TSC2, VHL and XRCC2 (sequencing and deletion/duplication); EGFR, EGLN1, HOXB13, KIT, MITF, PDGFRA, POLD1, and POLE (sequencing only); EPCAM and GREM1 (deletion/duplication only). DNA and RNA analyses performed for * genes. The test report has been scanned into EPIC and is located under the Molecular Pathology section of the Results Review tab.  A portion of the result report is included below for reference.     We discussed with Ms. Biel that because current genetic testing is not perfect, it is possible there may be a gene mutation in one of these genes that current testing cannot detect, but that chance is small.  We also discussed, that there could be another gene that has not yet been discovered, or that we have not yet tested, that is responsible for the cancer diagnoses in the family. It is also possible there is a hereditary cause for the cancer in the family that Ms. Dials did not inherit and therefore was not identified in her testing.  Therefore, it is important to remain in touch with cancer genetics in the future so that we can continue to offer Ms. Skow the most up to date genetic testing.   Genetic testing did identify a variant of uncertain significance (VUS) was identified in the BRCA1 gene called c.640G>A.  At this time, it is unknown if this variant is associated with increased cancer risk or if this is a normal finding, but most variants such as this get reclassified to being inconsequential. It should not be used to make medical management decisions. With time, we suspect the lab will determine the significance of this variant, if any. If we do learn more about it, we will try to contact Ms. Carder to discuss it further. However, it is important to stay in touch with [place] periodically and keep the address and phone number up to date.  ADDITIONAL GENETIC TESTING: We discussed with Ms. Dutton that her genetic testing was fairly extensive.  If there are genes identified to increase  cancer risk that can be analyzed in the future, we would be happy to discuss and coordinate this testing at that time.    CANCER SCREENING RECOMMENDATIONS: Ms. Kerney test result is considered negative (normal).  This means that we have not identified a hereditary cause for her personal and family history of cancer at this time. Most cancers happen by chance and this negative test suggests that her cancer may fall into this category.    While reassuring, this does not definitively rule out a hereditary predisposition to cancer. It is still possible that there could be genetic mutations that are undetectable by current technology. There could be genetic mutations in genes that have not been tested or identified to increase cancer risk.  Therefore, it is recommended she continue to follow the cancer management and screening guidelines provided by her primary healthcare provider.   An individual's cancer risk and medical management are not determined by genetic test results alone. Overall cancer risk assessment incorporates additional factors, including personal medical history, family history, and any available genetic information that may result in a personalized plan for cancer prevention and surveillance  RECOMMENDATIONS FOR FAMILY MEMBERS:  Individuals in this family might be at some increased risk of developing cancer, over the general  population risk, simply due to the family history of cancer.  We recommended women in this family have a yearly mammogram beginning at age 38, or 87 years younger than the earliest onset of cancer, an annual clinical breast exam, and perform monthly breast self-exams. Women in this family should also have a gynecological exam as recommended by their primary provider. All family members should be referred for colonoscopy starting at age 39.  FOLLOW-UP: Lastly, we discussed with Ms. Wettstein that cancer genetics is a rapidly advancing field and it is possible that new  genetic tests will be appropriate for her and/or her family members in the future. We encouraged her to remain in contact with cancer genetics on an annual basis so we can update her personal and family histories and let her know of advances in cancer genetics that may benefit this family.   Our contact number was provided. Ms. Capri's questions were answered to her satisfaction, and she knows she is welcome to call us at anytime with additional questions or concerns.   Roma Kayser, [place], [facility] Licensed, Certified Genetic Counselor [email or web address]

## 2021-03-22 ENCOUNTER — Ambulatory Visit (INDEPENDENT_AMBULATORY_CARE_PROVIDER_SITE_OTHER): Payer: Commercial Managed Care - PPO | Admitting: Obstetrics and Gynecology

## 2021-03-22 ENCOUNTER — Telehealth: Payer: Self-pay

## 2021-03-22 ENCOUNTER — Other Ambulatory Visit: Payer: Self-pay

## 2021-03-22 ENCOUNTER — Encounter: Payer: Self-pay | Admitting: Obstetrics and Gynecology

## 2021-03-22 VITALS — BP 110/64 | HR 82 | Ht 65.0 in | Wt 237.0 lb

## 2021-03-22 DIAGNOSIS — R109 Unspecified abdominal pain: Secondary | ICD-10-CM | POA: Diagnosis not present

## 2021-03-22 DIAGNOSIS — N644 Mastodynia: Secondary | ICD-10-CM

## 2021-03-22 NOTE — Patient Instructions (Addendum)
You can also try evening primrose oil and vit E for breast pain.   Breast Tenderness Breast tenderness is a common problem for women of all ages, but may also occur in men. Breast tenderness may range from mild discomfort to severe pain. In women, the pain usually comes and goes with the menstrual cycle, but it can also be constant. Breast tenderness has many possible causes, including hormone changes, infections, and taking certain medicines. You may have tests, such as a mammogram or an ultrasound, to check for any unusual findings. Having breast tenderness usually does not mean that you have breast cancer. Follow these instructions at home: Managing pain and discomfort  If directed, put ice to the painful area. To do this: Put ice in a plastic bag. Place a towel between your skin and the bag. Leave the ice on for 20 minutes, 2-3 times a day. Wear a supportive bra, especially during exercise. You may also want to wear a supportive bra while sleeping if your breasts are very tender. Medicines Take over-the-counter and prescription medicines only as told by your health care provider. If the cause of your pain is infection, you may be prescribed an antibiotic medicine. If you were prescribed an antibiotic, take it as told by your health care provider. Do not stop taking the antibiotic even if you start to feel better. Eating and drinking Your health care provider may recommend that you lessen the amount of fat in your diet. You can do this by: Limiting fried foods. Cooking foods using methods such as baking, boiling, grilling, and broiling. Decrease the amount of caffeine in your diet. Instead, drink more water and choose caffeine-free drinks. General instructions  Keep a log of the days and times when your breasts are most tender. Ask your health care provider how to do breast exams at home. This will help you notice if you have an unusual growth or lump. Keep all follow-up visits as told by  your health care provider. This is important. Contact a health care provider if: Any part of your breast is hard, red, and hot to the touch. This may be a sign of infection. You are a woman and: Not breastfeeding and you have fluid, especially blood or pus, coming out of your nipples. Have a new or painful lump in your breast that remains after your menstrual period ends. You have a fever. Your pain does not improve or it gets worse. Your pain is interfering with your daily activities. Summary Breast tenderness may range from mild discomfort to severe pain. Breast tenderness has many possible causes, including hormone changes, infections, and taking certain medicines. It can be treated with ice, wearing a supportive bra, and medicines. Make changes to your diet if told to by your health care provider. This information is not intended to replace advice given to you by your health care provider. Make sure you discuss any questions you have with your health care provider. Document Revised: 07/20/2018 Document Reviewed: 07/20/2018 Elsevier Patient Education  [place]. Musculoskeletal Pain Musculoskeletal pain refers to aches and pains in your bones, joints, muscles, and the tissues that surround them. This pain can occur in any part of the body. It can last for a short time (acute) or a long time (chronic). A physical exam, lab tests, and imaging studies may be done to find the cause of your musculoskeletal pain. Follow these instructions at home: Lifestyle Try to control or lower your stress levels. Stress increases muscle tension and  can worsen musculoskeletal pain. It is important to recognize when you are anxious or stressed and learn ways to manage it. This may include: Meditation or yoga. Cognitive or behavioral therapy. Acupuncture or massage therapy. You may continue all activities unless the activities cause more pain. When the pain gets better, slowly resume your normal  activities. Gradually increase the intensity and duration of your activities or exercise. Managing pain, stiffness, and swelling   Treatment may include medicines for pain and inflammation that are taken by mouth or applied to the skin. Take over-the-counter and prescription medicines only as told by your health care provider. When your pain is severe, bed rest may be helpful. Lie or sit in any position that is comfortable, but get out of bed and walk around at least every couple of hours. If directed, apply heat to the affected area as often as told by your health care provider. Use the heat source that your health care provider recommends, such as a moist heat pack or a heating pad. Place a towel between your skin and the heat source. Leave the heat on for 20-30 minutes. Remove the heat if your skin turns bright red. This is especially important if you are unable to feel pain, heat, or cold. You may have a greater risk of getting burned. If directed, put ice on the painful area. To do this: Put ice in a plastic bag. Place a towel between your skin and the bag. Leave the ice on for 20 minutes, 2-3 times a day. Remove the ice if your skin turns bright red. This is very important. If you cannot feel pain, heat, or cold, you have a greater risk of damage to the area. General instructions Your health care provider may recommend that you see a physical therapist. This person can help you come up with a safe exercise program. If told by your health care provider, do physical therapy exercises to improve movement and strength in the affected area. Keep all follow-up visits. This is important. This includes any physical therapy visits. Contact a health care provider if: Your pain gets worse. Medicines do not help ease your pain. You cannot use the part of your body that hurts, such as your arm, leg, or neck. You have trouble sleeping. You have trouble doing your normal activities. Get help right  away if: You have a new injury and your pain is worse or different. You feel numb or you have tingling in the painful area. Summary Musculoskeletal pain refers to aches and pains in your bones, joints, muscles, and the tissues that surround them. This pain can occur in any part of the body. Your health care provider may recommend that you see a physical therapist. This person can help you come up with a safe exercise program. Do any exercises as told by your physical therapist. Lower your stress level. Stress can worsen musculoskeletal pain. Ways to lower stress may include meditation, yoga, cognitive or behavioral therapy, acupuncture, and massage therapy. This information is not intended to replace advice given to you by your health care provider. Make sure you discuss any questions you have with your health care provider. Document Revised: 07/01/2019 Document Reviewed: 06/09/2019 Elsevier Patient Education  2022 [employer].

## 2021-03-22 NOTE — Telephone Encounter (Signed)
Referral order placed in Epic. 

## 2021-03-22 NOTE — Telephone Encounter (Signed)
Message Received: Today Salvadore Dom, MD  [facility] Triage    Please schedule her for PT for left sided abdominal wall pain.  Thanks,  Sharee Pimple
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
## 2021-03-26 NOTE — Telephone Encounter (Signed)
PT office documented in referral "I spoke with Ms. Lori Callahan and she would like to call back to schedule at a later time. I provided her with our telephone number to get scheduled. "

## 2021-03-27 ENCOUNTER — Encounter: Payer: Self-pay | Admitting: Physician Assistant

## 2021-03-27 ENCOUNTER — Ambulatory Visit (INDEPENDENT_AMBULATORY_CARE_PROVIDER_SITE_OTHER): Payer: Commercial Managed Care - PPO | Admitting: Physician Assistant

## 2021-03-27 ENCOUNTER — Other Ambulatory Visit: Payer: Self-pay

## 2021-03-27 VITALS — BP 108/70 | HR 84 | Temp 97.4°F | Ht 65.0 in | Wt 239.0 lb

## 2021-03-27 DIAGNOSIS — L089 Local infection of the skin and subcutaneous tissue, unspecified: Secondary | ICD-10-CM

## 2021-03-27 MED ORDER — IBUPROFEN 600 MG PO TABS: 600.0000 mg | ORAL_TABLET | Freq: Three times a day (TID) | ORAL | 0 refills | Status: DC | PRN

## 2021-03-27 MED ORDER — DOXYCYCLINE HYCLATE 100 MG PO TABS: 100.0000 mg | ORAL_TABLET | Freq: Two times a day (BID) | ORAL | 0 refills | Status: DC

## 2021-03-27 MED ORDER — ONDANSETRON HCL 4 MG PO TABS: 4.0000 mg | ORAL_TABLET | Freq: Three times a day (TID) | ORAL | 0 refills | Status: DC | PRN

## 2021-03-27 NOTE — Patient Instructions (Signed)
It was great to see you!  Let's start doxycycline for your infection Take ibuprofen scheduled for a few days to calm down inflammation May take zofran for nausea  I will be in touch with your infection count results  If any new/worsening symptoms -- go to the ER.  Take care,  Inda Coke PA-C
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
## 2021-03-28 ENCOUNTER — Encounter: Payer: Self-pay | Admitting: Physician Assistant

## 2021-03-28 LAB — CBC WITH DIFFERENTIAL/PLATELET
Basophils Absolute: 0.1 10*3/uL (ref 0.0–0.1)
Basophils Relative: 1.2 % (ref 0.0–3.0)
Eosinophils Absolute: 0.1 10*3/uL (ref 0.0–0.7)
Eosinophils Relative: 1.8 % (ref 0.0–5.0)
HCT: 38.4 % (ref 36.0–46.0)
Hemoglobin: 12.9 g/dL (ref 12.0–15.0)
Lymphocytes Relative: 34.4 % (ref 12.0–46.0)
Lymphs Abs: 2.5 10*3/uL (ref 0.7–4.0)
MCHC: 33.5 g/dL (ref 30.0–36.0)
MCV: 92.4 fl (ref 78.0–100.0)
Monocytes Absolute: 0.4 10*3/uL (ref 0.1–1.0)
Monocytes Relative: 5.3 % (ref 3.0–12.0)
Neutro Abs: 4.2 10*3/uL (ref 1.4–7.7)
Neutrophils Relative %: 57.3 % (ref 43.0–77.0)
Platelets: 227 10*3/uL (ref 150.0–400.0)
RBC: 4.16 Mil/uL (ref 3.87–5.11)
RDW: 13.5 % (ref 11.5–15.5)
WBC: 7.4 10*3/uL (ref 4.0–10.5)

## 2021-03-28 NOTE — Telephone Encounter (Signed)
Left detailed message on voicemail that I did call you but did not leave message sent My Chart message. I need to know if you went back to work today for the work note. Please call me back or send me a My Chart message.

## 2021-03-28 NOTE — Telephone Encounter (Signed)
Separate message sent to pt from [facility].

## 2021-03-29 ENCOUNTER — Encounter: Payer: Self-pay | Admitting: Physician Assistant

## 2021-03-30 ENCOUNTER — Encounter (HOSPITAL_BASED_OUTPATIENT_CLINIC_OR_DEPARTMENT_OTHER): Payer: Self-pay | Admitting: *Deleted

## 2021-03-30 ENCOUNTER — Other Ambulatory Visit: Payer: Self-pay | Admitting: Physician Assistant

## 2021-03-30 ENCOUNTER — Other Ambulatory Visit: Payer: Self-pay

## 2021-03-30 ENCOUNTER — Emergency Department (HOSPITAL_BASED_OUTPATIENT_CLINIC_OR_DEPARTMENT_OTHER)
Admission: EM | Admit: 2021-03-30 | Discharge: 2021-03-30 | Disposition: A | Discharge status: Home / Self Care | Payer: Commercial Managed Care - PPO | Attending: Emergency Medicine | Admitting: Emergency Medicine

## 2021-03-30 DIAGNOSIS — Z8616 Personal history of COVID-19: Secondary | ICD-10-CM | POA: Diagnosis not present

## 2021-03-30 DIAGNOSIS — L0291 Cutaneous abscess, unspecified: Secondary | ICD-10-CM

## 2021-03-30 DIAGNOSIS — Z85828 Personal history of other malignant neoplasm of skin: Secondary | ICD-10-CM | POA: Diagnosis not present

## 2021-03-30 DIAGNOSIS — Z79899 Other long term (current) drug therapy: Secondary | ICD-10-CM | POA: Insufficient documentation

## 2021-03-30 DIAGNOSIS — E039 Hypothyroidism, unspecified: Secondary | ICD-10-CM | POA: Insufficient documentation

## 2021-03-30 DIAGNOSIS — L02214 Cutaneous abscess of groin: Secondary | ICD-10-CM | POA: Diagnosis present

## 2021-03-30 LAB — CBC
HCT: 40 % (ref 36.0–46.0)
Hemoglobin: 13.5 g/dL (ref 12.0–15.0)
MCH: 30.9 pg (ref 26.0–34.0)
MCHC: 33.8 g/dL (ref 30.0–36.0)
MCV: 91.5 fL (ref 80.0–100.0)
Platelets: 208 10*3/uL (ref 150–400)
RBC: 4.37 MIL/uL (ref 3.87–5.11)
RDW: 12.6 % (ref 11.5–15.5)
WBC: 7.6 10*3/uL (ref 4.0–10.5)
nRBC: 0 % (ref 0.0–0.2)

## 2021-03-30 LAB — BASIC METABOLIC PANEL
Anion gap: 6 (ref 5–15)
BUN: 8 mg/dL (ref 6–20)
CO2: 28 mmol/L (ref 22–32)
Calcium: 9.4 mg/dL (ref 8.9–10.3)
Chloride: 103 mmol/L (ref 98–111)
Creatinine, Ser: 0.7 mg/dL (ref 0.44–1.00)
GFR, Estimated: >60 mL/min (ref >60)
Glucose, Bld: 90 mg/dL (ref 70–99)
Potassium: 4.5 mmol/L (ref 3.5–5.1)
Sodium: 137 mmol/L (ref 135–145)

## 2021-03-30 MED ORDER — ONDANSETRON 8 MG PO TBDP: 8.0000 mg | ORAL_TABLET | Freq: Three times a day (TID) | ORAL | 0 refills | Status: DC | PRN

## 2021-03-30 MED ORDER — HYDROCODONE-ACETAMINOPHEN 5-325 MG PO TABS: 2.0000 | ORAL_TABLET | ORAL | 0 refills | Status: DC | PRN

## 2021-03-30 MED ORDER — ACETAMINOPHEN-CODEINE #3 300-30 MG PO TABS: 1.0000 | ORAL_TABLET | ORAL | 0 refills | Status: DC | PRN
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
## 2021-03-30 NOTE — ED Triage Notes (Signed)
Pt reports that she as an abscess between labia and leg on left side.  Pt states that this has been recurring for about 4 years and this flare up began Saturday and she was placed on antibiotics on 1/17 by pcp.  Pt reports that she has been feeling worse and had fevers and PCP advised for her to come here to be evalauted.  Pt reports that it is draining.

## 2021-03-30 NOTE — Telephone Encounter (Signed)
Spoke to pt asked pt how she is feeling, told her saw message that it is draining now.Asked her ifvomited anymore? Pt said she just vomited once just a little bit ago. Asked if having headache or dizziness? Pt said she has a headache and feels lethargic. Told pt need to go to the ED to be evaluated per [facility] due to you are already on antibiotics and not feeling any better she is afraid you might be getting septic and need IV antibiotics. Pt verbalized understanding and will go to the ED. Pt given info for ED on [facility].

## 2021-03-30 NOTE — Discharge Instructions (Addendum)
Continue taking the doxycycline twice daily as prescribed.  You can continue taking the ibuprofen and Tylenol for pain.  I have prescribed you Zofran for nausea and a stronger narcotic medicine to take for severe pain.  Try to rely on Tylenol and ibuprofen when possible.  If you do develop fever, elevated heart rate, spreading of the redness please return back to the ED.  Otherwise, continue doing the warm compresses at home and taking the antibiotics.  Follow-up with your primary on Monday for recheck, please schedule an appointment with general surgery for follow-up evaluation.

## 2021-04-05 ENCOUNTER — Ambulatory Visit: Payer: Commercial Managed Care - PPO | Admitting: Physician Assistant

## 2021-04-09 ENCOUNTER — Ambulatory Visit: Payer: Commercial Managed Care - PPO | Admitting: Physician Assistant

## 2021-04-12 ENCOUNTER — Other Ambulatory Visit: Payer: Self-pay

## 2021-04-12 ENCOUNTER — Encounter: Payer: Self-pay | Admitting: Physician Assistant

## 2021-04-12 ENCOUNTER — Ambulatory Visit (INDEPENDENT_AMBULATORY_CARE_PROVIDER_SITE_OTHER): Payer: Commercial Managed Care - PPO | Admitting: Physician Assistant

## 2021-04-12 VITALS — BP 110/80 | HR 78 | Temp 97.6°F | Ht 65.0 in | Wt 237.5 lb

## 2021-04-12 DIAGNOSIS — E039 Hypothyroidism, unspecified: Secondary | ICD-10-CM | POA: Diagnosis not present

## 2021-04-12 DIAGNOSIS — Z136 Encounter for screening for cardiovascular disorders: Secondary | ICD-10-CM

## 2021-04-12 DIAGNOSIS — Z0000 Encounter for general adult medical examination without abnormal findings: Secondary | ICD-10-CM

## 2021-04-12 DIAGNOSIS — F3181 Bipolar II disorder: Secondary | ICD-10-CM | POA: Diagnosis not present

## 2021-04-12 DIAGNOSIS — L0291 Cutaneous abscess, unspecified: Secondary | ICD-10-CM

## 2021-04-12 DIAGNOSIS — Z13220 Encounter for screening for lipoid disorders: Secondary | ICD-10-CM | POA: Diagnosis not present

## 2021-04-12 DIAGNOSIS — F419 Anxiety disorder, unspecified: Secondary | ICD-10-CM | POA: Diagnosis not present

## 2021-04-12 DIAGNOSIS — C4372 Malignant melanoma of left lower limb, including hip: Secondary | ICD-10-CM

## 2021-04-12 DIAGNOSIS — Z1159 Encounter for screening for other viral diseases: Secondary | ICD-10-CM

## 2021-04-12 DIAGNOSIS — E669 Obesity, unspecified: Secondary | ICD-10-CM

## 2021-04-12 LAB — COMPREHENSIVE METABOLIC PANEL
ALT: 19 U/L (ref 0–35)
AST: 15 U/L (ref 0–37)
Albumin: 4.2 g/dL (ref 3.5–5.2)
Alkaline Phosphatase: 66 U/L (ref 39–117)
BUN: 9 mg/dL (ref 6–23)
CO2: 23 mEq/L (ref 19–32)
Calcium: 9.1 mg/dL (ref 8.4–10.5)
Chloride: 106 mEq/L (ref 96–112)
Creatinine, Ser: 0.76 mg/dL (ref 0.40–1.20)
GFR: 101.56 mL/min (ref >60.00)
Glucose, Bld: 86 mg/dL (ref 70–99)
Potassium: 4.3 mEq/L (ref 3.5–5.1)
Sodium: 136 mEq/L (ref 135–145)
Total Bilirubin: 0.4 mg/dL (ref 0.2–1.2)
Total Protein: 7.1 g/dL (ref 6.0–8.3)

## 2021-04-12 LAB — CBC WITH DIFFERENTIAL/PLATELET
Basophils Absolute: 0 10*3/uL (ref 0.0–0.1)
Basophils Relative: 0.5 % (ref 0.0–3.0)
Eosinophils Absolute: 0.1 10*3/uL (ref 0.0–0.7)
Eosinophils Relative: 1.1 % (ref 0.0–5.0)
HCT: 39.5 % (ref 36.0–46.0)
Hemoglobin: 13.3 g/dL (ref 12.0–15.0)
Lymphocytes Relative: 33.1 % (ref 12.0–46.0)
Lymphs Abs: 2.1 10*3/uL (ref 0.7–4.0)
MCHC: 33.6 g/dL (ref 30.0–36.0)
MCV: 91.6 fl (ref 78.0–100.0)
Monocytes Absolute: 0.4 10*3/uL (ref 0.1–1.0)
Monocytes Relative: 6 % (ref 3.0–12.0)
Neutro Abs: 3.8 10*3/uL (ref 1.4–7.7)
Neutrophils Relative %: 59.3 % (ref 43.0–77.0)
Platelets: 221 10*3/uL (ref 150.0–400.0)
RBC: 4.31 Mil/uL (ref 3.87–5.11)
RDW: 13.1 % (ref 11.5–15.5)
WBC: 6.5 10*3/uL (ref 4.0–10.5)

## 2021-04-12 LAB — LIPID PANEL
Cholesterol: 194 mg/dL (ref 0–200)
HDL: 55.7 mg/dL (ref >39.00)
LDL Cholesterol: 115 mg/dL — ABNORMAL HIGH (ref 0–99)
NonHDL: 138.18
Total CHOL/HDL Ratio: 3
Triglycerides: 116 mg/dL (ref 0.0–149.0)
VLDL: 23.2 mg/dL (ref 0.0–40.0)

## 2021-04-12 LAB — HEMOGLOBIN A1C: Hgb A1c MFr Bld: 5 % (ref 4.6–6.5)

## 2021-04-12 LAB — TSH: TSH: 5.26 u[IU]/mL (ref 0.35–5.50)

## 2021-04-12 MED ORDER — CLONAZEPAM 0.5 MG PO TABS: ORAL_TABLET | ORAL | 0 refills | Status: DC

## 2021-04-12 NOTE — Patient Instructions (Addendum)
It was great to see you!  Psych recommendation [email or web address]  Please go to the lab for blood work.   Our office will call you with your results unless you have chosen to receive results via MyChart.  If your blood work is normal we will follow-up each year for physicals and as scheduled for chronic medical problems.  If anything is abnormal we will treat accordingly and get you in for a follow-up.  Take care,  Aldona Bar
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
## 2021-04-13 ENCOUNTER — Encounter: Payer: Self-pay | Admitting: Physician Assistant

## 2021-04-13 LAB — HEPATITIS C ANTIBODY
Hepatitis C Ab: NONREACTIVE
SIGNAL TO CUT-OFF: 0.06 (ref <1.00)

## 2021-04-17 ENCOUNTER — Encounter: Payer: Self-pay | Admitting: Physician Assistant

## 2021-04-17 NOTE — Telephone Encounter (Signed)
Spoke to pt told her the papers you sent are not the same as the last ones we filled out. 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 Pt said she did not even look at them she will contact work and get back to me. Told her okay.

## 2021-04-17 NOTE — Telephone Encounter (Signed)
Left message on voicemail to call office.  

## 2021-04-17 NOTE — Telephone Encounter (Signed)
Patient has returned call.    Please follow up at [phone].

## 2021-04-19 ENCOUNTER — Telehealth: Payer: Commercial Managed Care - PPO | Admitting: Physician Assistant

## 2021-04-19 DIAGNOSIS — Z20818 Contact with and (suspected) exposure to other bacterial communicable diseases: Secondary | ICD-10-CM

## 2021-04-19 DIAGNOSIS — J029 Acute pharyngitis, unspecified: Secondary | ICD-10-CM | POA: Diagnosis not present

## 2021-04-19 MED ORDER — AMOXICILLIN 500 MG PO TABS: 500.0000 mg | ORAL_TABLET | Freq: Two times a day (BID) | ORAL | 0 refills | Status: DC

## 2021-04-19 NOTE — Progress Notes (Signed)

## 2021-04-19 NOTE — Progress Notes (Signed)
I have spent 5 minutes in review of e-visit questionnaire, review and updating patient chart, medical decision making and response to patient.   Elison Worrel Cody Kline Bulthuis, PA-C    

## 2021-05-09 ENCOUNTER — Other Ambulatory Visit: Payer: Self-pay | Admitting: Physician Assistant

## 2021-05-09 NOTE — Telephone Encounter (Signed)
Last Visit: 04/12/21 ? ?Next Visit: none scheduled ? ?Last Refill: 04/12/21 ? ?Quanitiy: 30  ? ?Pt requesting higher dosage ? ?

## 2021-05-10 MED ORDER — CLONAZEPAM 0.5 MG PO TABS: ORAL_TABLET | ORAL | 2 refills | Status: AC

## 2021-05-10 NOTE — Telephone Encounter (Signed)
Samantha please see message. 

## 2021-05-10 NOTE — Telephone Encounter (Signed)
Spoke to pt so as I mentioned before Lori Callahan is okay  to continue current rx, but she is not comfortable with increasing the dosage. Pt verbalized understanding and said that is fine. Told her if she feels like she needs an increased dosage, she will need to discuss her psychiatrist taking over this rx. Pt verbalized understanding. Told her will send Rx same dose. Pt verbalized understanding. ?

## 2021-05-10 NOTE — Telephone Encounter (Signed)
Spoke to pt asked her if still seeing Psychiatry? Pt said yes. Told her Lori Callahan said she is not comfortable increasing your Klonopin if you are seeing Psychiatry they are the ones who should order since they are managing your mental health. Pt said Lori Callahan has been prescribing Klonopin for her not Psychiatry and they do not want to prescribe since she is. Told her okay I will let Lori Callahan know and get back to you. Pt verbalized understanding. ?

## 2021-05-10 NOTE — Telephone Encounter (Signed)
Please call patient.  ?Is she seeing psychiatry? ?I am not comfortable increasing her klonopin for uncontrolled anxiety if she is seeing psychiatry. I can give a refill of the current script, but even that should likely come from psych so they are managing all of her mental health medications. ?
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
## 2021-06-07 ENCOUNTER — Ambulatory Visit: Payer: Commercial Managed Care - PPO | Admitting: Obstetrics and Gynecology

## 2021-07-17 ENCOUNTER — Other Ambulatory Visit: Payer: Self-pay | Admitting: Physician Assistant

## 2021-07-17 DIAGNOSIS — N6320 Unspecified lump in the left breast, unspecified quadrant: Secondary | ICD-10-CM

## 2021-07-19 ENCOUNTER — Other Ambulatory Visit: Payer: Self-pay | Admitting: Physician Assistant

## 2021-07-19 ENCOUNTER — Ambulatory Visit
Admission: RE | Admit: 2021-07-19 | Discharge: 2021-07-19 | Disposition: A | Discharge status: Home / Self Care | Payer: Commercial Managed Care - PPO | Source: Ambulatory Visit | Attending: Nurse Practitioner | Admitting: Nurse Practitioner

## 2021-07-19 DIAGNOSIS — N6320 Unspecified lump in the left breast, unspecified quadrant: Secondary | ICD-10-CM

## 2021-08-14 ENCOUNTER — Ambulatory Visit: Payer: Commercial Managed Care - PPO | Admitting: Obstetrics and Gynecology
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
## 2021-08-23 ENCOUNTER — Ambulatory Visit: Payer: Commercial Managed Care - PPO | Admitting: Obstetrics and Gynecology

## 2021-08-23 DIAGNOSIS — Z0289 Encounter for other administrative examinations: Secondary | ICD-10-CM

## 2021-08-27 ENCOUNTER — Other Ambulatory Visit: Payer: Self-pay | Admitting: Physician Assistant

## 2021-09-05 DIAGNOSIS — N644 Mastodynia: Secondary | ICD-10-CM | POA: Insufficient documentation

## 2021-09-13 DIAGNOSIS — R002 Palpitations: Secondary | ICD-10-CM | POA: Insufficient documentation
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
## 2021-09-14 ENCOUNTER — Ambulatory Visit: Payer: Commercial Managed Care - PPO | Admitting: Cardiology

## 2021-09-14 DIAGNOSIS — R002 Palpitations: Secondary | ICD-10-CM

## 2021-09-14 DIAGNOSIS — I700 Atherosclerosis of aorta: Secondary | ICD-10-CM

## 2021-09-25 ENCOUNTER — Telehealth: Payer: Commercial Managed Care - PPO | Admitting: Physician Assistant

## 2021-09-25 DIAGNOSIS — J9801 Acute bronchospasm: Secondary | ICD-10-CM | POA: Diagnosis not present

## 2021-09-25 DIAGNOSIS — J392 Other diseases of pharynx: Secondary | ICD-10-CM

## 2021-09-25 NOTE — Progress Notes (Signed)
I have spent 5 minutes in review of e-visit questionnaire, review and updating patient chart, medical decision making and response to patient.   Kolyn Rozario Cody Bynum Mccullars, PA-C    
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
## 2021-10-12 ENCOUNTER — Telehealth: Payer: Commercial Managed Care - PPO | Admitting: Physician Assistant

## 2021-10-12 DIAGNOSIS — R110 Nausea: Secondary | ICD-10-CM | POA: Diagnosis not present

## 2021-10-12 MED ORDER — ONDANSETRON HCL 4 MG PO TABS: 4.0000 mg | ORAL_TABLET | Freq: Three times a day (TID) | ORAL | 0 refills | Status: DC | PRN

## 2021-10-12 NOTE — Progress Notes (Signed)

## 2021-10-15 ENCOUNTER — Encounter: Payer: Self-pay | Admitting: Physician Assistant
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
## 2021-11-09 ENCOUNTER — Ambulatory Visit: Payer: Commercial Managed Care - PPO | Attending: Cardiology | Admitting: Cardiology

## 2021-11-09 DIAGNOSIS — I700 Atherosclerosis of aorta: Secondary | ICD-10-CM

## 2021-11-09 DIAGNOSIS — R002 Palpitations: Secondary | ICD-10-CM

## 2021-12-07 ENCOUNTER — Telehealth: Payer: Commercial Managed Care - PPO | Admitting: Physician Assistant

## 2021-12-07 DIAGNOSIS — R110 Nausea: Secondary | ICD-10-CM

## 2021-12-07 MED ORDER — PROMETHAZINE HCL 25 MG PO TABS: 25.0000 mg | ORAL_TABLET | Freq: Three times a day (TID) | ORAL | 0 refills | Status: DC | PRN

## 2021-12-07 NOTE — Patient Instructions (Signed)
Bonney Leitz, thank you for joining Mar Daring, PA-C for today's virtual visit.  While this provider is not your primary care provider (PCP), if your PCP is located in our provider database this encounter information will be shared with them immediately following your visit.  Consent: (Patient) Lori Callahan provided verbal consent for this virtual visit at the beginning of the encounter.  Current Medications:  Current Outpatient Medications:    promethazine (PHENERGAN) 25 MG tablet, Take 1 tablet (25 mg total) by mouth every 8 (eight) hours as needed for nausea or vomiting., Disp: 20 tablet, Rfl: 0   clonazePAM (KLONOPIN) 0.5 MG tablet, TAKE 1 TABLET BY MOUTH EVERY DAY AT BEDTIME AS NEEDED FOR ANXIETY, Disp: 30 tablet, Rfl: 2   FLUoxetine (PROZAC) 20 MG capsule, Take 20 mg by mouth daily., Disp: , Rfl:    levothyroxine (SYNTHROID) 75 MCG tablet, TAKE 1 TABLET BY MOUTH EVERY DAY, Disp: 90 tablet, Rfl: 1   ondansetron (ZOFRAN) 4 MG tablet, Take 1 tablet (4 mg total) by mouth every 8 (eight) hours as needed for nausea or vomiting., Disp: 20 tablet, Rfl: 0   Oxcarbazepine (TRILEPTAL) 300 MG tablet, Take 900 mg by mouth every evening., Disp: , Rfl:    propranolol (INDERAL) 40 MG tablet, Take by mouth 2 (two) times daily. ORDERED TID TAKES BID, Disp: , Rfl:    Medications ordered in this encounter:  Meds ordered this encounter  Medications   promethazine (PHENERGAN) 25 MG tablet    Sig: Take 1 tablet (25 mg total) by mouth every 8 (eight) hours as needed for nausea or vomiting.    Dispense:  20 tablet    Refill:  0    Order Specific Question:   Supervising Provider    Answer:   Chase Picket [id number]     *If you need refills on other medications prior to your next appointment, please contact your pharmacy*  Follow-Up: Call back or seek an in-person evaluation if the symptoms worsen or if the condition fails to improve as anticipated.  [facility] [phone]  Other Instructions  Nausea, Adult Nausea is feeling like you may vomit. Feeling like you may vomit is usually not serious, but it may be an early sign of a more serious medical problem. Vomiting is when stomach contents forcefully come out of your mouth. If you vomit, or if you are not able to drink enough fluids, you may not have enough water in your body (get dehydrated). If you do not have enough water in your body, you may: Feel tired. Feel thirsty. Have a dry mouth. Have cracked lips. Pee (urinate) less often. Older adults and people who have other diseases or a weak body defense system (immune system) have a higher risk of not having enough water in the body. The main goals of treating this condition are: To relieve your nausea. To ensure your nausea occurs less often. To prevent vomiting and losing too much fluid. Follow these instructions at home: Watch your symptoms for any changes. Tell your doctor about them. Eating and drinking     Take an ORS (oral rehydration solution). This is a drink that is sold at pharmacies and stores. Drink clear fluids in small amounts as you are able. These include: Water. Ice chips. Fruit juice that has water added (diluted fruit juice). Low-calorie sports drinks. Eat bland, easy-to-digest foods in small amounts as you are able, such as: Bananas. Applesauce. Rice. Low-fat (lean) meats. Toast.  Crackers. Avoid drinking fluids that have a lot of sugar or caffeine in them. This includes energy drinks, sports drinks, and soda. Avoid alcohol. Avoid spicy or fatty foods. General instructions Take over-the-counter and prescription medicines only as told by your doctor. Rest at home while you get better. Drink enough fluid to keep your pee (urine) pale yellow. Take slow and deep breaths when you feel like you may vomit. Avoid food or things that have strong smells. Wash your hands often with soap and water for at least 20 seconds.  If you cannot use soap and water, use hand sanitizer. Make sure that everyone in your home washes their hands well and often. Keep all follow-up visits. Contact a doctor if: You feel worse. You feel like you may vomit and this lasts for more than 2 days. You vomit. You are not able to drink fluids without vomiting. You have new symptoms. You have a fever. You have a headache. You have muscle cramps. You have a rash. You have pain while peeing. You feel light-headed or dizzy. Get help right away if: You have pain in your chest, neck, arm, or jaw. You feel very weak or you faint. You have vomit that is bright red or looks like coffee grounds. You have bloody or black poop (stools) or poop that looks like tar. You have a very bad headache, a stiff neck, or both. You have very bad pain, cramping, or bloating in your belly (abdomen). You have trouble breathing or you are breathing very quickly. Your heart is beating very quickly. Your skin feels cold and clammy. You feel confused. You have signs of losing too much water in your body, such as: Dark pee, very little pee, or no pee. Cracked lips. Dry mouth. Sunken eyes. Sleepiness. Weakness. These symptoms may be an emergency. Get help right away. Call 911. Do not wait to see if the symptoms will go away. Do not drive yourself to the hospital. Summary Nausea is feeling like you are about vomit. If you vomit, or if you are not able to drink enough fluids, you may not have enough water in your body (get dehydrated). Eat and drink what your doctor tells you. Take over-the-counter and prescription medicines only as told by your doctor. Contact a doctor right away if your symptoms get worse or you have new symptoms. Keep all follow-up visits. This information is not intended to replace advice given to you by your health care provider. Make sure you discuss any questions you have with your health care provider. Document Revised:  09/01/2020 Document Reviewed: 09/01/2020 Elsevier Patient Education  [place].    If you have been instructed to have an in-person evaluation today at a local Urgent Care facility, please use the link below. It will take you to a list of all of our available [facility], including address, phone number and hours of operation. Please do not delay care.  [facility]  If you or a family member do not have a primary care provider, use the link below to schedule a visit and establish care. When you choose a [facility] primary care physician or advanced practice provider, you gain a long-term partner in health. Find a Primary Care Provider  Learn more about 's in-office and virtual care options: [facility]

## 2021-12-07 NOTE — Progress Notes (Signed)
Virtual Visit Consent   Lori Callahan, you are scheduled for a virtual visit with a [facility] provider today. Just as with appointments in the office, your consent must be obtained to participate. Your consent will be active for this visit and any virtual visit you may have with one of our providers in the next 365 days. If you have a MyChart account, a copy of this consent can be sent to you electronically.  As this is a virtual visit, video technology does not allow for your provider to perform a traditional examination. This may limit your provider's ability to fully assess your condition. If your provider identifies any concerns that need to be evaluated in person or the need to arrange testing (such as labs, EKG, etc.), we will make arrangements to do so. Although advances in technology are sophisticated, we cannot ensure that it will always work on either your end or our end. If the connection with a video visit is poor, the visit may have to be switched to a telephone visit. With either a video or telephone visit, we are not always able to ensure that we have a secure connection.  By engaging in this virtual visit, you consent to the provision of healthcare and authorize for your insurance to be billed (if applicable) for the services provided during this visit. Depending on your insurance coverage, you may receive a charge related to this service.  I need to obtain your verbal consent now. Are you willing to proceed with your visit today? Lori Callahan has provided verbal consent on 12/07/2021 for a virtual visit (video or telephone). Mar Daring, PA-C  Date: 12/07/2021 9:35 AM  Virtual Visit via Video Note   I, Mar Daring, connected with  Lori Callahan  ([id number], 08-06-1985) on 12/07/21 at  9:30 AM EDT by a video-enabled telemedicine application and verified that I am speaking with the correct person using two identifiers.  Location: Patient: Virtual Visit Location  Patient: Home Provider: Virtual Visit Location Provider: Home Office   I discussed the limitations of evaluation and management by telemedicine and the availability of in person appointments. The patient expressed understanding and agreed to proceed.    History of Present Illness: Lori Callahan is a 36 y.o. who identifies as a female who was assigned female at birth, and is being seen today for increased anxiety and nausea.  HPI: Emesis  This is a recurrent problem. Episode onset: having increased anxiety with bipolar that is increasing nausea and IBS type symptoms. The problem occurs intermittently. The problem has been gradually worsening. There has been no fever. Associated symptoms include abdominal pain. Pertinent negatives include no chest pain, chills, diarrhea, dizziness, fever, headaches or weight loss. She has tried nothing for the symptoms. The treatment provided no relief.     Problems:  Patient Active Problem List   Diagnosis Date Noted   Palpitations 09/13/2021   Genetic testing 03/13/2021   Family history of breast cancer 02/22/2021   S/P laparoscopic hysterectomy 09/18/2020   MRSA (methicillin resistant Staphylococcus aureus) infection 06/21/2020   Acquired hypothyroidism 06/09/2020   Nevus of toe of left foot 05/02/2020   Aortic atherosclerosis ([place]) 04/11/2020   Malignant melanoma of left foot ([place]) 03/17/2020   Bipolar 2 disorder ([place]) 05/25/2019   Anxiety 05/25/2019    Allergies:  Allergies  Allergen Reactions   Reglan [Metoclopramide] Shortness Of Breath   Medications:  Current Outpatient Medications:    promethazine (PHENERGAN) 25 MG tablet,  Take 1 tablet (25 mg total) by mouth every 8 (eight) hours as needed for nausea or vomiting., Disp: 20 tablet, Rfl: 0   clonazePAM (KLONOPIN) 0.5 MG tablet, TAKE 1 TABLET BY MOUTH EVERY DAY AT BEDTIME AS NEEDED FOR ANXIETY, Disp: 30 tablet, Rfl: 2   FLUoxetine (PROZAC) 20 MG capsule, Take 20 mg by mouth daily., Disp: ,  Rfl:    levothyroxine (SYNTHROID) 75 MCG tablet, TAKE 1 TABLET BY MOUTH EVERY DAY, Disp: 90 tablet, Rfl: 1   ondansetron (ZOFRAN) 4 MG tablet, Take 1 tablet (4 mg total) by mouth every 8 (eight) hours as needed for nausea or vomiting., Disp: 20 tablet, Rfl: 0   Oxcarbazepine (TRILEPTAL) 300 MG tablet, Take 900 mg by mouth every evening., Disp: , Rfl:    propranolol (INDERAL) 40 MG tablet, Take by mouth 2 (two) times daily. ORDERED TID TAKES BID, Disp: , Rfl:   Observations/Objective: Patient is well-developed, well-nourished in no acute distress.  Resting comfortably at home.  Head is normocephalic, atraumatic.  No labored breathing.  Speech is clear and coherent with logical content.  Patient is alert and oriented at baseline.    Assessment and Plan: 1. Nausea - promethazine (PHENERGAN) 25 MG tablet; Take 1 tablet (25 mg total) by mouth every 8 (eight) hours as needed for nausea or vomiting.  Dispense: 20 tablet; Refill: 0  - Promethazine for nausea - Work note provided - Follow up with Psychiatrist as needed  Follow Up Instructions: I discussed the assessment and treatment plan with the patient. The patient was provided an opportunity to ask questions and all were answered. The patient agreed with the plan and demonstrated an understanding of the instructions.  A copy of instructions were sent to the patient via MyChart unless otherwise noted below.    The patient was advised to call back or seek an in-person evaluation if the symptoms worsen or if the condition fails to improve as anticipated.  Time:  I spent 8 minutes with the patient via telehealth technology discussing the above problems/concerns.    Mar Daring, PA-C

## 2021-12-12 ENCOUNTER — Encounter: Payer: Self-pay | Admitting: Physician Assistant

## 2021-12-12 ENCOUNTER — Telehealth (INDEPENDENT_AMBULATORY_CARE_PROVIDER_SITE_OTHER): Payer: Commercial Managed Care - PPO | Admitting: Physician Assistant

## 2021-12-12 VITALS — Ht 65.0 in | Wt 238.0 lb

## 2021-12-12 DIAGNOSIS — F3181 Bipolar II disorder: Secondary | ICD-10-CM

## 2021-12-12 NOTE — Telephone Encounter (Signed)
FMLA form printed and completed and faxed to [facility] Team at [phone].
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
## 2021-12-23 ENCOUNTER — Encounter: Payer: Commercial Managed Care - PPO | Admitting: Nurse Practitioner

## 2021-12-23 ENCOUNTER — Telehealth: Payer: Commercial Managed Care - PPO | Admitting: Physician Assistant

## 2021-12-23 DIAGNOSIS — R197 Diarrhea, unspecified: Secondary | ICD-10-CM | POA: Diagnosis not present

## 2021-12-23 DIAGNOSIS — R112 Nausea with vomiting, unspecified: Secondary | ICD-10-CM

## 2021-12-23 DIAGNOSIS — R110 Nausea: Secondary | ICD-10-CM

## 2021-12-23 MED ORDER — CIPROFLOXACIN HCL 500 MG PO TABS: 500.0000 mg | ORAL_TABLET | Freq: Two times a day (BID) | ORAL | 0 refills | Status: AC

## 2021-12-23 NOTE — Progress Notes (Signed)
Virtual Visit via Video Note   I, Inda Coke, connected with  Lori Callahan  ([id number], 09/06/1985) on 12/23/21 at 11:30 AM EDT by a video-enabled telemedicine application and verified that I am speaking with the correct person using two identifiers.  Location: Patient: Home Provider: [place] office   I discussed the limitations of evaluation and management by telemedicine and the availability of in person appointments. The patient expressed understanding and agreed to proceed.    History of Present Illness: Lori Callahan is a 36 y.o. who identifies as a female who was assigned female at birth, and is being seen today for GI illness.  Recently brought a tortoise into the home. She is having diarrhea and vomiting since this morning. Vomiting x 2. She is staying hydrated but not trying to eat much. Denies blood in stool or fever. Mild abdominal cramping. Children at home have URI sx but no other significant GI sx.  Problems:  Patient Active Problem List   Diagnosis Date Noted   Palpitations 09/13/2021   Genetic testing 03/13/2021   Family history of breast cancer 02/22/2021   S/P laparoscopic hysterectomy 09/18/2020   MRSA (methicillin resistant Staphylococcus aureus) infection 06/21/2020   Acquired hypothyroidism 06/09/2020   Nevus of toe of left foot 05/02/2020   Aortic atherosclerosis ([place]) 04/11/2020   Malignant melanoma of left foot ([place]) 03/17/2020   Bipolar 2 disorder ([place]) 05/25/2019   Anxiety 05/25/2019    Allergies:  Allergies  Allergen Reactions   Reglan [Metoclopramide] Shortness Of Breath   Medications:  Current Outpatient Medications:    ciprofloxacin (CIPRO) 500 MG tablet, Take 1 tablet (500 mg total) by mouth 2 (two) times daily for 3 days., Disp: 6 tablet, Rfl: 0   amphetamine-dextroamphetamine (ADDERALL XR) 5 MG 24 hr capsule, Take 5 mg by mouth daily., Disp: , Rfl:    busPIRone (BUSPAR) 5 MG tablet, Take 5 mg by mouth 3 (three)  times daily., Disp: , Rfl:    clonazePAM (KLONOPIN) 0.5 MG tablet, TAKE 1 TABLET BY MOUTH EVERY DAY AT BEDTIME AS NEEDED FOR ANXIETY, Disp: 30 tablet, Rfl: 2   levothyroxine (SYNTHROID) 75 MCG tablet, TAKE 1 TABLET BY MOUTH EVERY DAY, Disp: 90 tablet, Rfl: 1   Oxcarbazepine (TRILEPTAL) 300 MG tablet, Take 900 mg by mouth every evening., Disp: , Rfl:    promethazine (PHENERGAN) 25 MG tablet, Take 1 tablet (25 mg total) by mouth every 8 (eight) hours as needed for nausea or vomiting., Disp: 20 tablet, Rfl: 0   propranolol (INDERAL) 60 MG tablet, Take 30 mg by mouth 2 (two) times daily., Disp: , Rfl:    venlafaxine XR (EFFEXOR-XR) 75 MG 24 hr capsule, Take 75 mg by mouth daily., Disp: , Rfl:   Observations/Objective: Patient is well-developed, well-nourished in no acute distress.  Resting comfortably  at home.  Head is normocephalic, atraumatic.  No labored breathing.  Speech is clear and coherent with logical content.  Patient is alert and oriented at baseline.   Assessment and Plan: 1. Diarrhea of presumed infectious origin  2. Nausea and vomiting, unspecified vomiting type  Suspect viral gastroenteritis. No red flags on discussion. Recommend supportive care -- including zofran, fluids, rest, BRAT diet, excellent hand hygiene. No red flags warranting abx need at this time, should this be salmonella, however I did send in pocket rx for her to take cipro 500 mg BID x 3 days should her symptoms significantly progress. Low threshold to obtain stool studies and any  other additional work-up if indicated. If any worsening over the weekend, needs to go to the ER.  Follow Up Instructions: I discussed the assessment and treatment plan with the patient. The patient was provided an opportunity to ask questions and all were answered. The patient agreed with the plan and demonstrated an understanding of the instructions.  A copy of instructions were sent to the patient via MyChart unless otherwise  noted below.   The patient was advised to call back or seek an in-person evaluation if the symptoms worsen or if the condition fails to improve as anticipated.  Inda Coke, [place]

## 2021-12-23 NOTE — Patient Instructions (Signed)
It was great to see you!  Nontyphoidal Salmonella gastroenteritis is usually self-limited.  Fever generally resolves within 48 to 72 hours, and diarrhea resolves within 4 to 10 days.  If your symptoms persist, let me know and we will get stool studies checked for you.

## 2021-12-23 NOTE — Progress Notes (Signed)
Virtual visit already completed today for current symptoms

## 2022-01-03 NOTE — Telephone Encounter (Signed)
Please advise  Pt scheduled for 10/31 virtual visit.   If in person needed, route back for follow up.

## 2022-01-03 NOTE — Telephone Encounter (Addendum)
I have printed Old FMLA paperwork and put new paperwork with it. See message. FMLA forms are in your folder.

## 2022-01-08 ENCOUNTER — Telehealth (INDEPENDENT_AMBULATORY_CARE_PROVIDER_SITE_OTHER): Payer: Commercial Managed Care - PPO | Admitting: Physician Assistant

## 2022-01-08 ENCOUNTER — Encounter: Payer: Self-pay | Admitting: Physician Assistant

## 2022-01-08 VITALS — Ht 65.0 in | Wt 240.0 lb

## 2022-01-08 DIAGNOSIS — F3181 Bipolar II disorder: Secondary | ICD-10-CM

## 2022-01-08 DIAGNOSIS — F419 Anxiety disorder, unspecified: Secondary | ICD-10-CM

## 2022-01-08 NOTE — Progress Notes (Signed)
I acted as a Education administrator for [employer], PA-C [name], LPN  Virtual Visit via Video Note   I, Inda Coke, Pa, connected with  Lori Callahan  ([id number], September 22, 1985) on 01/08/22 at 10:20 AM EDT by a video-enabled telemedicine application and verified that I am speaking with the correct person using two identifiers.  Location: Patient: Home Provider: [place] office   I discussed the limitations of evaluation and management by telemedicine and the availability of in person appointments. The patient expressed understanding and agreed to proceed.    History of Present Illness: Lori Callahan is a 36 y.o. who identifies as a female who was assigned female at birth, and is being seen today for Bipolar, anxiety/depression,  FMLA form completion.   Pt needs increase in days off and she is working 11 hour days now not 8 hours. She is also required to work weekends for the time being. She continues to see Dr. Altamese Taylor. She has anxiety and panic attacks when having to work long hours. Denies SI/HI at [facility] time. She is compliant with her care with Dr Altamese [place].  Problems:  Patient Active Problem List   Diagnosis Date Noted   Palpitations 09/13/2021   Genetic testing 03/13/2021   Family history of breast cancer 02/22/2021   S/P laparoscopic hysterectomy 09/18/2020   MRSA (methicillin resistant Staphylococcus aureus) infection 06/21/2020   Acquired hypothyroidism 06/09/2020   Nevus of toe of left foot 05/02/2020   Aortic atherosclerosis ([place]) 04/11/2020   Malignant melanoma of left foot ([place]) 03/17/2020   Bipolar 2 disorder ([place]) 05/25/2019   Anxiety 05/25/2019    Allergies:  Allergies  Allergen Reactions   Reglan [Metoclopramide] Shortness Of Breath   Medications:  Current Outpatient Medications:    amphetamine-dextroamphetamine (ADDERALL XR) 5 MG 24 hr capsule, Take 5 mg by mouth daily., Disp: , Rfl:    busPIRone (BUSPAR) 5 MG tablet, Take 5 mg by mouth 3 (three)  times daily., Disp: , Rfl:    clonazePAM (KLONOPIN) 0.5 MG tablet, TAKE 1 TABLET BY MOUTH EVERY DAY AT BEDTIME AS NEEDED FOR ANXIETY, Disp: 30 tablet, Rfl: 2   levothyroxine (SYNTHROID) 75 MCG tablet, TAKE 1 TABLET BY MOUTH EVERY DAY, Disp: 90 tablet, Rfl: 1   Oxcarbazepine (TRILEPTAL) 300 MG tablet, Take 900 mg by mouth every evening., Disp: , Rfl:    promethazine (PHENERGAN) 25 MG tablet, Take 1 tablet (25 mg total) by mouth every 8 (eight) hours as needed for nausea or vomiting., Disp: 20 tablet, Rfl: 0   propranolol (INDERAL) 60 MG tablet, Take 30 mg by mouth 2 (two) times daily., Disp: , Rfl:    venlafaxine XR (EFFEXOR-XR) 75 MG 24 hr capsule, Take 75 mg by mouth daily., Disp: , Rfl:   Observations/Objective: Patient is well-developed, well-nourished in no acute distress.  Resting comfortably  at home.  Head is normocephalic, atraumatic.  No labored breathing.  Speech is clear and coherent with logical content.  Patient is alert and oriented at baseline.   Assessment and Plan: Anxiety; Bipolar 2 disorder (HCC) Will increase her temporary FMLA to help with her mood during this busy season with her job Follow-up with [place] in 1-2 months, sooner if concerns Continue plan to see Dr Altamese [place] as scheduled  Follow Up Instructions: I discussed the assessment and treatment plan with the patient. The patient was provided an opportunity to ask questions and all were answered. The patient agreed with the plan and demonstrated an understanding of the  instructions.  A copy of instructions were sent to the patient via MyChart unless otherwise noted below.   The patient was advised to call back or seek an in-person evaluation if the symptoms worsen or if the condition fails to improve as anticipated.  Inda Coke, [place]

## 2022-01-08 NOTE — Telephone Encounter (Signed)
Spoke to pt told her will place copy at the front desk for her to pick up.

## 2022-02-15 ENCOUNTER — Telehealth (INDEPENDENT_AMBULATORY_CARE_PROVIDER_SITE_OTHER): Payer: Commercial Managed Care - PPO | Admitting: Physician Assistant

## 2022-02-15 ENCOUNTER — Encounter: Payer: Self-pay | Admitting: Physician Assistant

## 2022-02-15 VITALS — Ht 65.0 in | Wt 240.0 lb

## 2022-02-15 DIAGNOSIS — R051 Acute cough: Secondary | ICD-10-CM

## 2022-02-15 MED ORDER — AMOXICILLIN-POT CLAVULANATE 875-125 MG PO TABS: 1.0000 | ORAL_TABLET | Freq: Two times a day (BID) | ORAL | 0 refills | Status: DC

## 2022-02-15 NOTE — Progress Notes (Signed)
Virtual Visit via Video Note   I, Inda Coke, connected with  Lori Callahan  ([id number], 04-02-1985) on 02/15/22 at 11:20 AM EST by a video-enabled telemedicine application and verified that I am speaking with the correct person using two identifiers.  Location: Patient: Home Provider: [place] office   I discussed the limitations of evaluation and management by telemedicine and the availability of in person appointments. The patient expressed understanding and agreed to proceed.    History of Present Illness: Lori Callahan is a 36 y.o. who identifies as a female who was assigned female at birth, and is being seen today for upper respiratory infection.  Patient reports that for the past week she has had cough, congestion, sinus pressure.  2 of her children have recently been sick with URI symptoms but their symptoms have resolved.  She denies: Shortness of breath, chest pain, poor appetite, weakness.  She is eating and drinking okay.  She denies true measured fever.  She continues to use electronic cigarettes.  Problems:  Patient Active Problem List   Diagnosis Date Noted   Palpitations 09/13/2021   Genetic testing 03/13/2021   Family history of breast cancer 02/22/2021   S/P laparoscopic hysterectomy 09/18/2020   MRSA (methicillin resistant Staphylococcus aureus) infection 06/21/2020   Acquired hypothyroidism 06/09/2020   Nevus of toe of left foot 05/02/2020   Aortic atherosclerosis ([place]) 04/11/2020   Malignant melanoma of left foot ([place]) 03/17/2020   Bipolar 2 disorder ([place]) 05/25/2019   Anxiety 05/25/2019    Allergies:  Allergies  Allergen Reactions   Reglan [Metoclopramide] Shortness Of Breath   Medications:  Current Outpatient Medications:    amoxicillin-clavulanate (AUGMENTIN) 875-125 MG tablet, Take 1 tablet by mouth 2 (two) times daily., Disp: 14 tablet, Rfl: 0   amphetamine-dextroamphetamine (ADDERALL XR) 5 MG 24 hr capsule, Take 5 mg by mouth  daily., Disp: , Rfl:    busPIRone (BUSPAR) 5 MG tablet, Take 5 mg by mouth 3 (three) times daily., Disp: , Rfl:    clonazePAM (KLONOPIN) 0.5 MG tablet, TAKE 1 TABLET BY MOUTH EVERY DAY AT BEDTIME AS NEEDED FOR ANXIETY, Disp: 30 tablet, Rfl: 2   levothyroxine (SYNTHROID) 75 MCG tablet, TAKE 1 TABLET BY MOUTH EVERY DAY, Disp: 90 tablet, Rfl: 1   Oxcarbazepine (TRILEPTAL) 300 MG tablet, Take 900 mg by mouth every evening., Disp: , Rfl:    promethazine (PHENERGAN) 25 MG tablet, Take 1 tablet (25 mg total) by mouth every 8 (eight) hours as needed for nausea or vomiting., Disp: 20 tablet, Rfl: 0   propranolol (INDERAL) 60 MG tablet, Take 30 mg by mouth 2 (two) times daily., Disp: , Rfl:    venlafaxine XR (EFFEXOR-XR) 75 MG 24 hr capsule, Take 75 mg by mouth daily., Disp: , Rfl:   Observations/Objective: Patient is well-developed, well-nourished in no acute distress.  Resting comfortably  at home.  Head is normocephalic, atraumatic.  No labored breathing.  Speech is clear and coherent with logical content.  Patient is alert and oriented at baseline.   Assessment and Plan: 1. Acute cough No red flags on discussion Suspect sinusitis Treat with oral Augmentin per orders Recommend over-the-counter dextromethorphan for cough suppressant Follow-up if new or worsening symptoms  Follow Up Instructions: I discussed the assessment and treatment plan with the patient. The patient was provided an opportunity to ask questions and all were answered. The patient agreed with the plan and demonstrated an understanding of the instructions.  A copy of instructions  were sent to the patient via MyChart unless otherwise noted below.   The patient was advised to call back or seek an in-person evaluation if the symptoms worsen or if the condition fails to improve as anticipated.  Inda Coke, [place]

## 2022-02-15 NOTE — Patient Instructions (Signed)
It was great to see you!  Please start oral antibiotic for your sinus infection, I have sent this into your pharmacy  I recommend over-the-counter cough syrup with dextromethorphan, 12-hour.  This is commonly called Delsym by any brand is acceptable.  Consider an antihistamine such as Claritin or Allegra for postnasal drip.  Follow-up with any new or worsening symptoms.

## 2022-03-04 ENCOUNTER — Other Ambulatory Visit: Payer: Self-pay | Admitting: Physician Assistant

## 2022-05-15 ENCOUNTER — Ambulatory Visit: Payer: Commercial Managed Care - PPO | Admitting: Physician Assistant

## 2022-07-02 ENCOUNTER — Encounter: Payer: Self-pay | Admitting: Physician Assistant

## 2022-07-02 ENCOUNTER — Telehealth (INDEPENDENT_AMBULATORY_CARE_PROVIDER_SITE_OTHER): Payer: Commercial Managed Care - PPO | Admitting: Physician Assistant

## 2022-07-02 VITALS — BP 120/80 | HR 96 | Temp 97.5°F | Ht 65.0 in | Wt 253.4 lb

## 2022-07-02 DIAGNOSIS — M7989 Other specified soft tissue disorders: Secondary | ICD-10-CM | POA: Diagnosis not present

## 2022-07-02 DIAGNOSIS — E039 Hypothyroidism, unspecified: Secondary | ICD-10-CM

## 2022-07-02 LAB — COMPREHENSIVE METABOLIC PANEL
ALT: 16 U/L (ref 0–35)
AST: 19 U/L (ref 0–37)
Albumin: 4 g/dL (ref 3.5–5.2)
Alkaline Phosphatase: 59 U/L (ref 39–117)
BUN: 10 mg/dL (ref 6–23)
CO2: 25 mEq/L (ref 19–32)
Calcium: 8.6 mg/dL (ref 8.4–10.5)
Chloride: 108 mEq/L (ref 96–112)
Creatinine, Ser: 0.81 mg/dL (ref 0.40–1.20)
GFR: 93.28 mL/min (ref >60.00)
Glucose, Bld: 94 mg/dL (ref 70–99)
Potassium: 4.1 mEq/L (ref 3.5–5.1)
Sodium: 140 mEq/L (ref 135–145)
Total Bilirubin: 0.2 mg/dL (ref 0.2–1.2)
Total Protein: 6.7 g/dL (ref 6.0–8.3)

## 2022-07-02 LAB — LIPID PANEL
Cholesterol: 197 mg/dL (ref 0–200)
HDL: 57.4 mg/dL (ref >39.00)
LDL Cholesterol: 110 mg/dL — ABNORMAL HIGH (ref 0–99)
NonHDL: 139.49
Total CHOL/HDL Ratio: 3
Triglycerides: 146 mg/dL (ref 0.0–149.0)
VLDL: 29.2 mg/dL (ref 0.0–40.0)

## 2022-07-02 LAB — URINALYSIS
Bilirubin Urine: NEGATIVE
Hgb urine dipstick: NEGATIVE
Ketones, ur: NEGATIVE
Leukocytes,Ua: NEGATIVE
Nitrite: NEGATIVE
Specific Gravity, Urine: 1.02 (ref 1.000–1.030)
Total Protein, Urine: NEGATIVE
Urine Glucose: NEGATIVE
Urobilinogen, UA: 0.2 (ref 0.0–1.0)
pH: 7.5 (ref 5.0–8.0)

## 2022-07-02 LAB — CBC WITH DIFFERENTIAL/PLATELET
Basophils Absolute: 0 10*3/uL (ref 0.0–0.1)
Basophils Relative: 0.6 % (ref 0.0–3.0)
Eosinophils Absolute: 0.1 10*3/uL (ref 0.0–0.7)
Eosinophils Relative: 2.4 % (ref 0.0–5.0)
HCT: 36.6 % (ref 36.0–46.0)
Hemoglobin: 12.5 g/dL (ref 12.0–15.0)
Lymphocytes Relative: 36.9 % (ref 12.0–46.0)
Lymphs Abs: 1.8 10*3/uL (ref 0.7–4.0)
MCHC: 34.2 g/dL (ref 30.0–36.0)
MCV: 92.6 fl (ref 78.0–100.0)
Monocytes Absolute: 0.3 10*3/uL (ref 0.1–1.0)
Monocytes Relative: 6.3 % (ref 3.0–12.0)
Neutro Abs: 2.7 10*3/uL (ref 1.4–7.7)
Neutrophils Relative %: 53.8 % (ref 43.0–77.0)
Platelets: 215 10*3/uL (ref 150.0–400.0)
RBC: 3.95 Mil/uL (ref 3.87–5.11)
RDW: 13.9 % (ref 11.5–15.5)
WBC: 5 10*3/uL (ref 4.0–10.5)

## 2022-07-02 LAB — T3, FREE: T3, Free: 2.7 pg/mL (ref 2.3–4.2)

## 2022-07-02 LAB — T4, FREE: Free T4: 0.6 ng/dL (ref 0.60–1.60)

## 2022-07-02 LAB — TSH: TSH: 5.77 u[IU]/mL — ABNORMAL HIGH (ref 0.35–5.50)

## 2022-07-02 NOTE — Patient Instructions (Signed)
It was great to see you!  Consider Wegovy or Zepbound -- call your insurance and if this covered, we can try to send in
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
## 2022-07-03 ENCOUNTER — Other Ambulatory Visit: Payer: Self-pay | Admitting: Physician Assistant

## 2022-07-03 MED ORDER — FUROSEMIDE 20 MG PO TABS: 20.0000 mg | ORAL_TABLET | Freq: Every day | ORAL | 0 refills | Status: DC | PRN

## 2022-07-25 ENCOUNTER — Encounter: Payer: Self-pay | Admitting: Physician Assistant

## 2022-07-26 ENCOUNTER — Other Ambulatory Visit: Payer: Self-pay | Admitting: Physician Assistant

## 2022-07-26 MED ORDER — WEGOVY 0.25 MG/0.5ML ~~LOC~~ SOAJ: 0.2500 mg | SUBCUTANEOUS | 1 refills | Status: DC

## 2022-07-27 ENCOUNTER — Other Ambulatory Visit: Payer: Self-pay | Admitting: Physician Assistant

## 2022-07-30 ENCOUNTER — Encounter: Payer: Self-pay | Admitting: Physician Assistant

## 2022-07-30 DIAGNOSIS — N644 Mastodynia: Secondary | ICD-10-CM

## 2022-08-03 ENCOUNTER — Telehealth: Payer: Self-pay

## 2022-08-03 ENCOUNTER — Other Ambulatory Visit (HOSPITAL_COMMUNITY): Payer: Self-pay

## 2022-08-03 NOTE — Telephone Encounter (Signed)
Received fax from pt's pharmacy requesting PA for [facility]. Per test claim, this is a plan/benefit exclusion and is not covered.

## 2022-09-03 ENCOUNTER — Other Ambulatory Visit: Payer: Self-pay | Admitting: Physician Assistant

## 2022-09-03 DIAGNOSIS — N6320 Unspecified lump in the left breast, unspecified quadrant: Secondary | ICD-10-CM

## 2022-09-03 DIAGNOSIS — N644 Mastodynia: Secondary | ICD-10-CM

## 2022-09-26 ENCOUNTER — Ambulatory Visit
Admission: RE | Admit: 2022-09-26 | Discharge: 2022-09-26 | Disposition: A | Discharge status: Home / Self Care | Payer: Commercial Managed Care - PPO | Source: Ambulatory Visit | Attending: Physician Assistant | Admitting: Physician Assistant

## 2022-09-26 ENCOUNTER — Ambulatory Visit: Payer: Commercial Managed Care - PPO

## 2022-09-26 DIAGNOSIS — N6320 Unspecified lump in the left breast, unspecified quadrant: Secondary | ICD-10-CM

## 2022-09-26 DIAGNOSIS — N644 Mastodynia: Secondary | ICD-10-CM

## 2022-10-07 ENCOUNTER — Encounter: Payer: Self-pay | Admitting: Physician Assistant

## 2022-10-07 NOTE — Telephone Encounter (Signed)
Please see message. Lori Callahan was denied by insurance due to plan exclusion, will not cover weight loss medications.

## 2022-12-01 IMAGING — US US BREAST*L* LIMITED INC AXILLA
1 series · 13 of 15 positions shown · non-contrast
Comparison: Diagnostic mammogram and ultrasound dated 06/16/2020

CLINICAL DATA: Follow-up for probably benign mass in the LEFT
breast. This probably benign finding was originally identified on
diagnostic mammogram and ultrasound dated 06/16/2020.

EXAM:
DIGITAL DIAGNOSTIC UNILATERAL LEFT MAMMOGRAM WITH TOMOSYNTHESIS AND
CAD; ULTRASOUND LEFT BREAST LIMITED
TECHNIQUE: Left digital diagnostic mammography and breast tomosynthesis was
performed. The images were evaluated with computer-aided detection.;
Targeted ultrasound examination of the left breast was performed.

[Series 1: us breast*left* limited inc axilla · 0.08mm/px · 15 acquisitions, 13 frames shown]
[im 1/15]
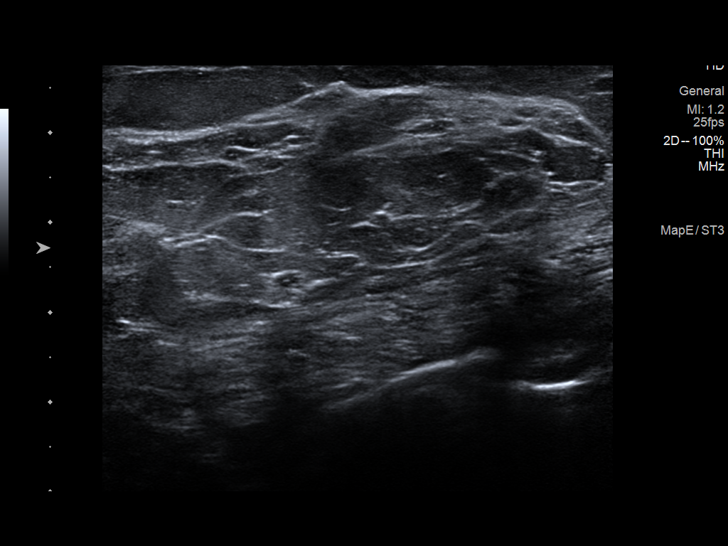
[im 2/15]
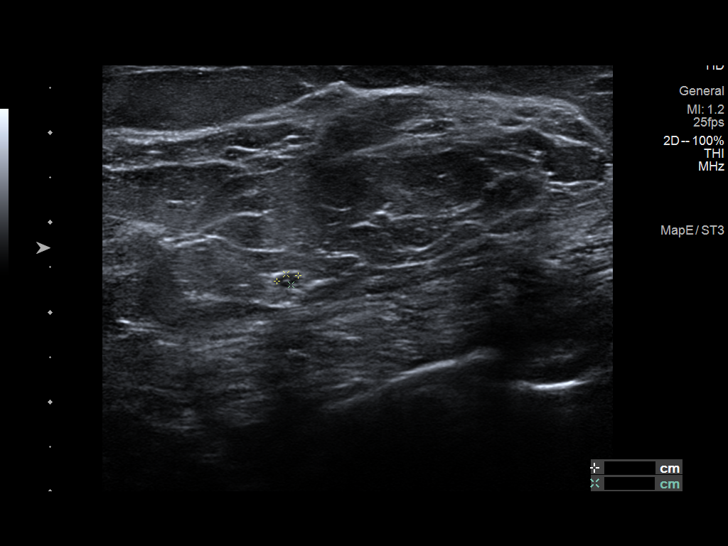
[im 3/15]
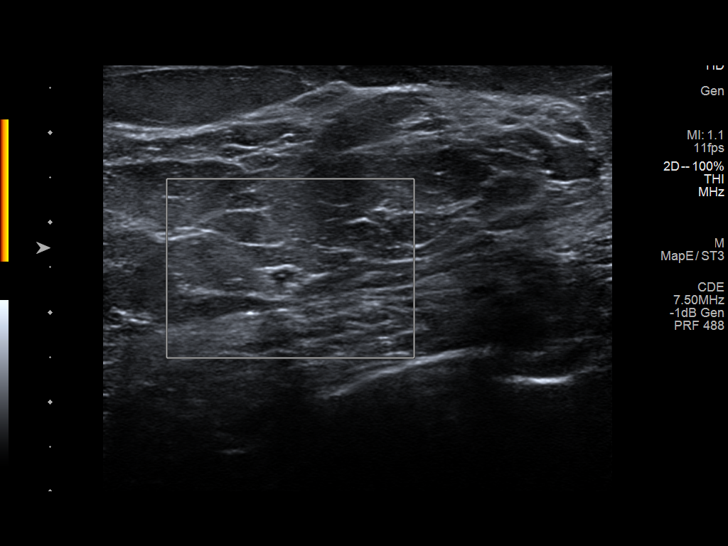
[im 5/15]
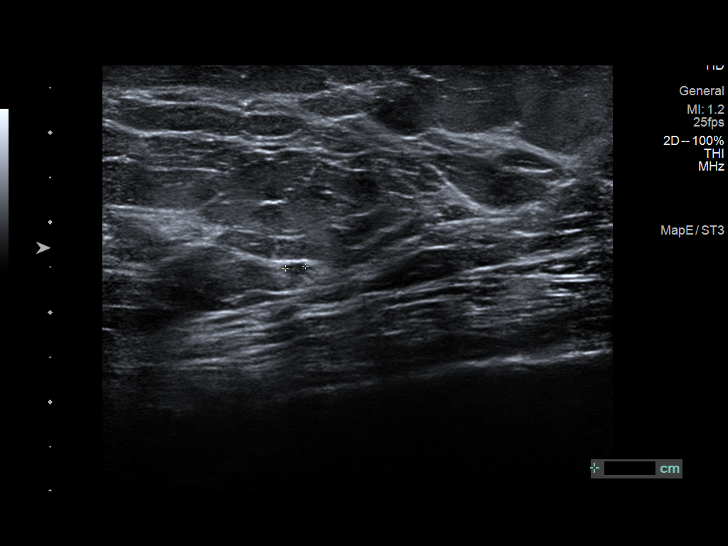
[im 6/15]
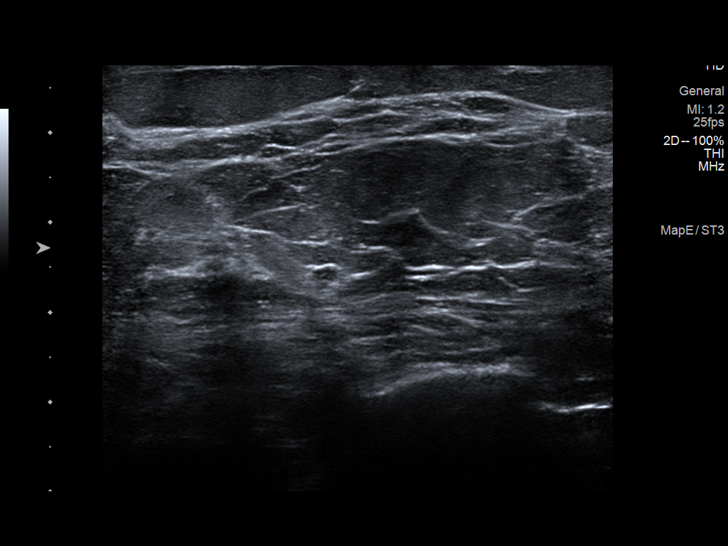
[im 7/15]
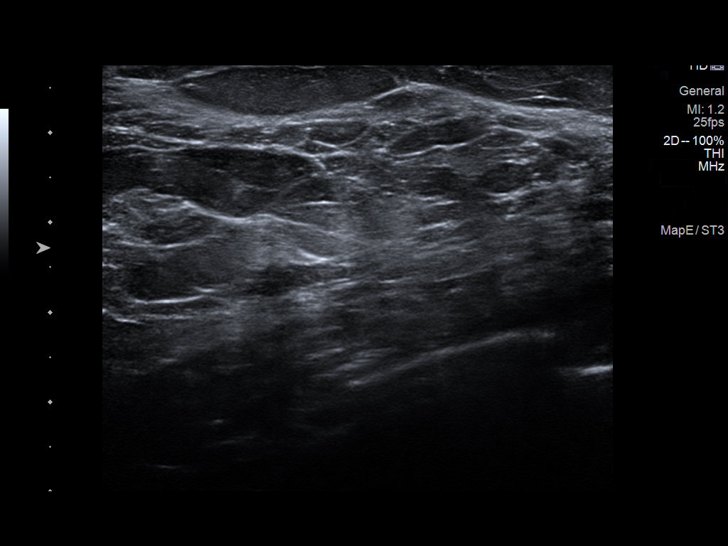
[im 8/15]
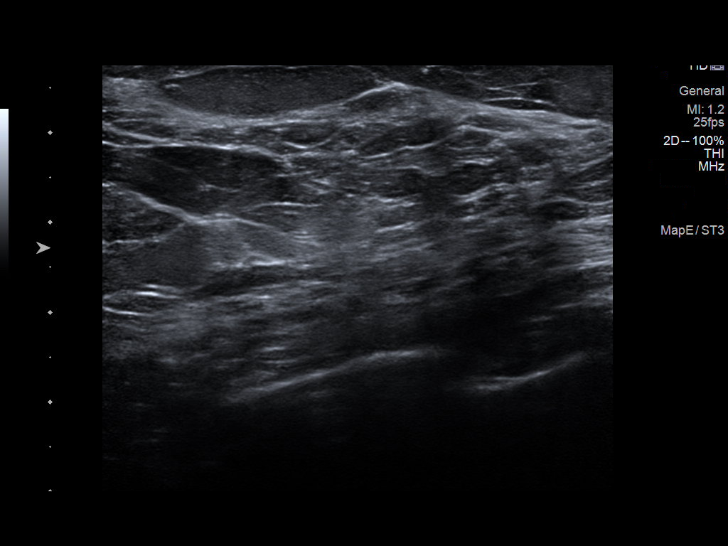
[im 9/15]
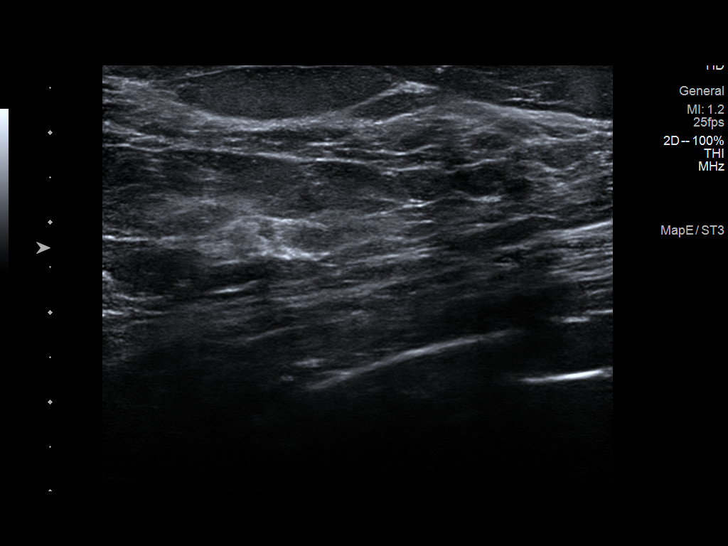
[im 10/15]
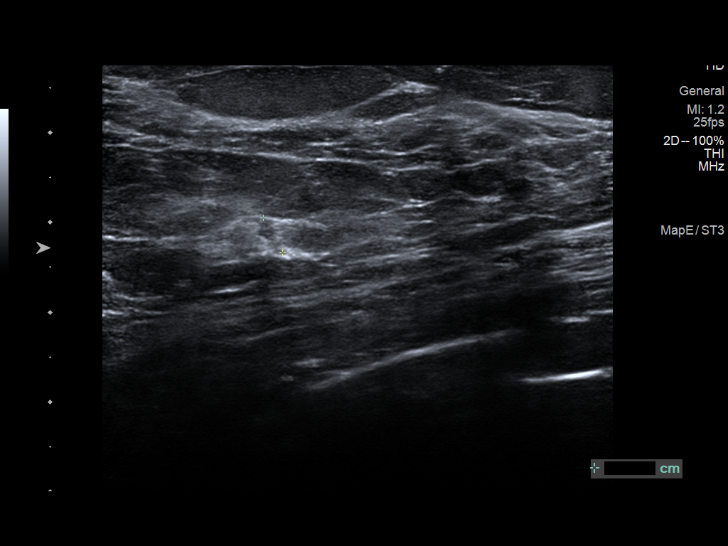
[im 11/15]
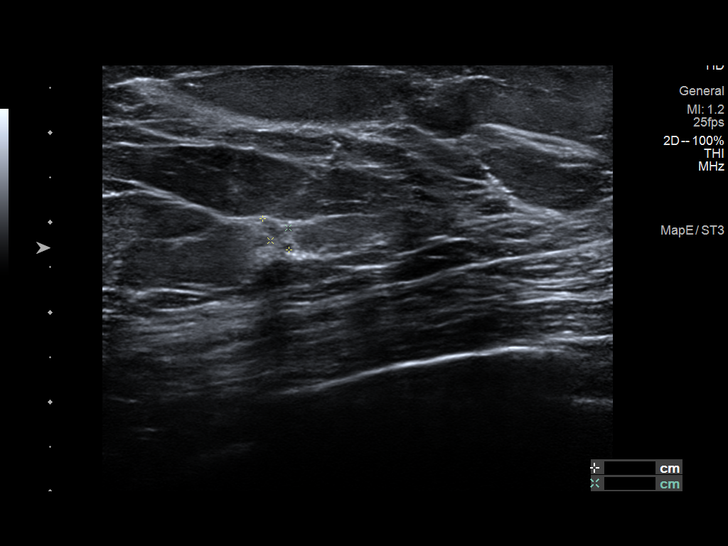
[im 13/15]
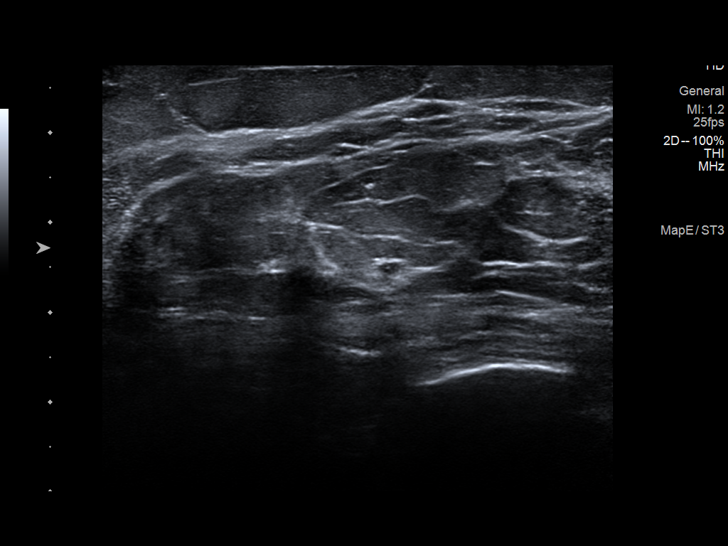
[im 14/15]
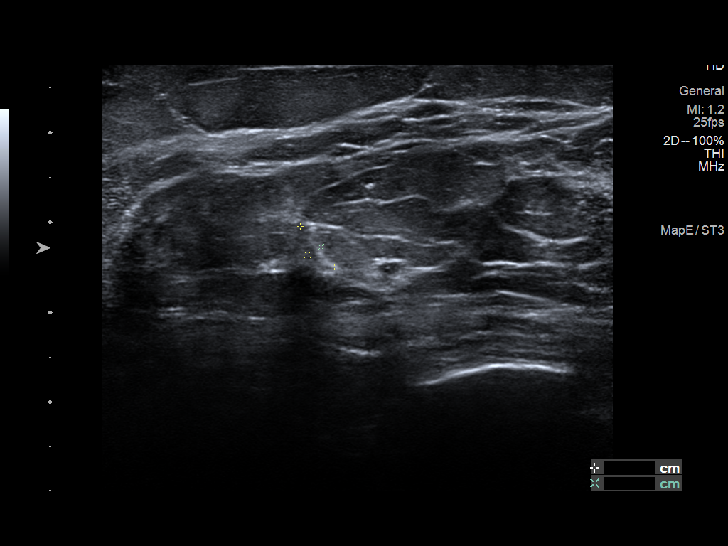
[im 15/15]
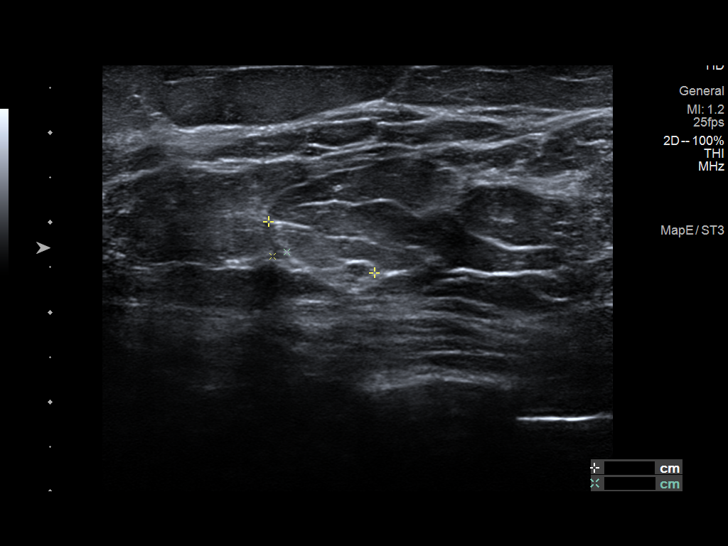

[13 of 15 positions shown; findings below may reference images not displayed]

ACR Breast Density Category b: There are scattered areas of
fibroglandular density.
FINDINGS: The oval circumscribed low-density mass within the outer LEFT breast
is stable mammographically. There are no new dominant masses,
suspicious calcifications or secondary signs of malignancy within
the LEFT breast.

Targeted ultrasound is performed, again showing an oval hypoechoic
mass in the LEFT breast at the 2 o'clock axis, 5 cm from the nipple,
stable to slightly smaller compared to the previous ultrasound,
currently measuring 3 x 1 x 2 mm.

Now also appreciated is a thin tubular structure immediately
adjacent to this hypoechoic mass, measuring 2 mm greatest thickness
and measuring 6 mm extent. The aforementioned previously
demonstrated mass and this newly appreciated tubular structure
overall measure 13 mm extent. This is suspected to represent a
benign cluster of microcysts or a benign intramammary lymph node
with adjacent blood vessel or mildly prominent duct.
IMPRESSION: Probably benign cluster of microcysts versus intramammary lymph node
with adjacent blood vessel or mildly prominent duct. Corresponding
mammographic findings are stable. Recommend additional follow-up
diagnostic mammogram in 6 months to ensure continued stability and
follow-up ultrasound at that time to ensure stability of today's
sonographic appearance.

RECOMMENDATION:
1. LEFT breast diagnostic mammogram and ultrasound in 6 months.
2. Patient describes a family history of breast cancer. Consider
genetics consultation. Also, given patient's family history of
breast cancer, would consider the addition of annual screening
breast MRI to annual screening mammography. Per American Cancer
Society guidelines, annual screening MRI of the breasts is
recommended if a risk assessment calculation for breast cancer,
preferably using the Tyrer-Cuzick or Gail model, measures greater
than 20%.

I have discussed the findings and recommendations with the patient.
If applicable, a reminder letter will be sent to the patient
regarding the next appointment.

BI-RADS CATEGORY  3: Probably benign.

## 2022-12-01 IMAGING — MG MM DIGITAL DIAGNOSTIC UNILAT*L* W/ TOMO W/ CAD
4 series · 5 of 12 positions shown · non-contrast
Comparison: Diagnostic mammogram and ultrasound dated 06/16/2020

CLINICAL DATA: Follow-up for probably benign mass in the LEFT
breast. This probably benign finding was originally identified on
diagnostic mammogram and ultrasound dated 06/16/2020.

EXAM:
DIGITAL DIAGNOSTIC UNILATERAL LEFT MAMMOGRAM WITH TOMOSYNTHESIS AND
CAD; ULTRASOUND LEFT BREAST LIMITED
TECHNIQUE: Left digital diagnostic mammography and breast tomosynthesis was
performed. The images were evaluated with computer-aided detection.;
Targeted ultrasound examination of the left breast was performed.

[L MLO synth-2D]
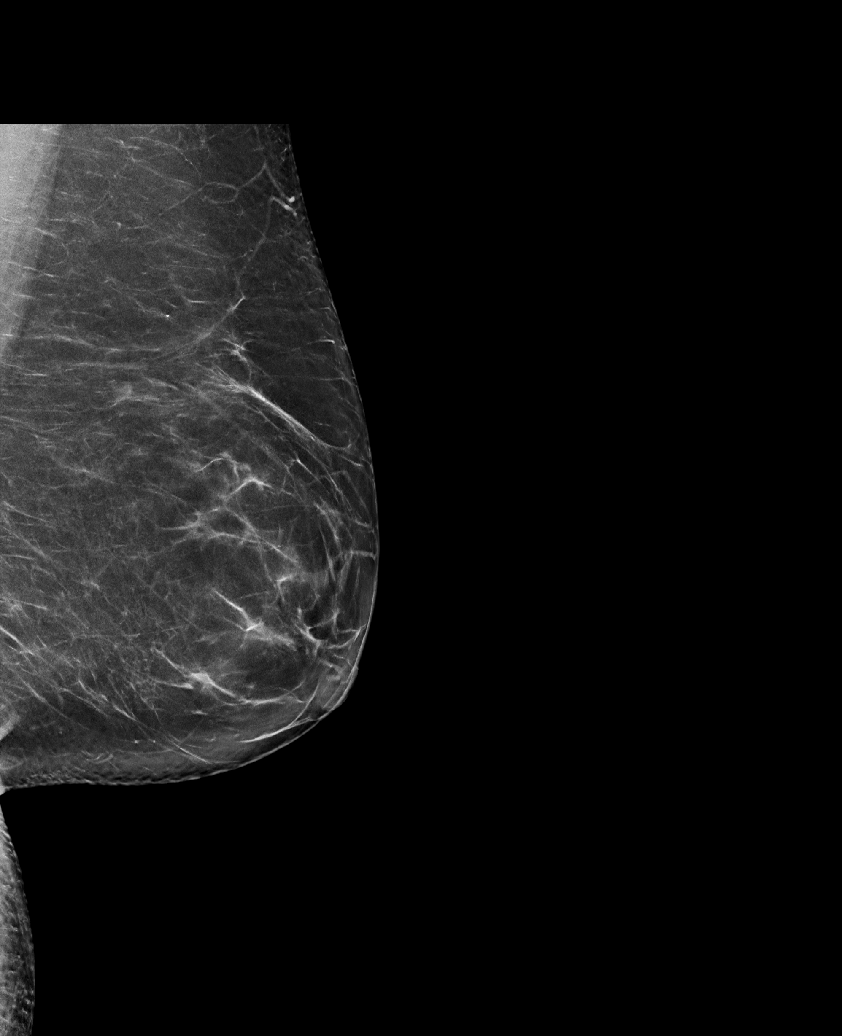

[L CC synth-2D]
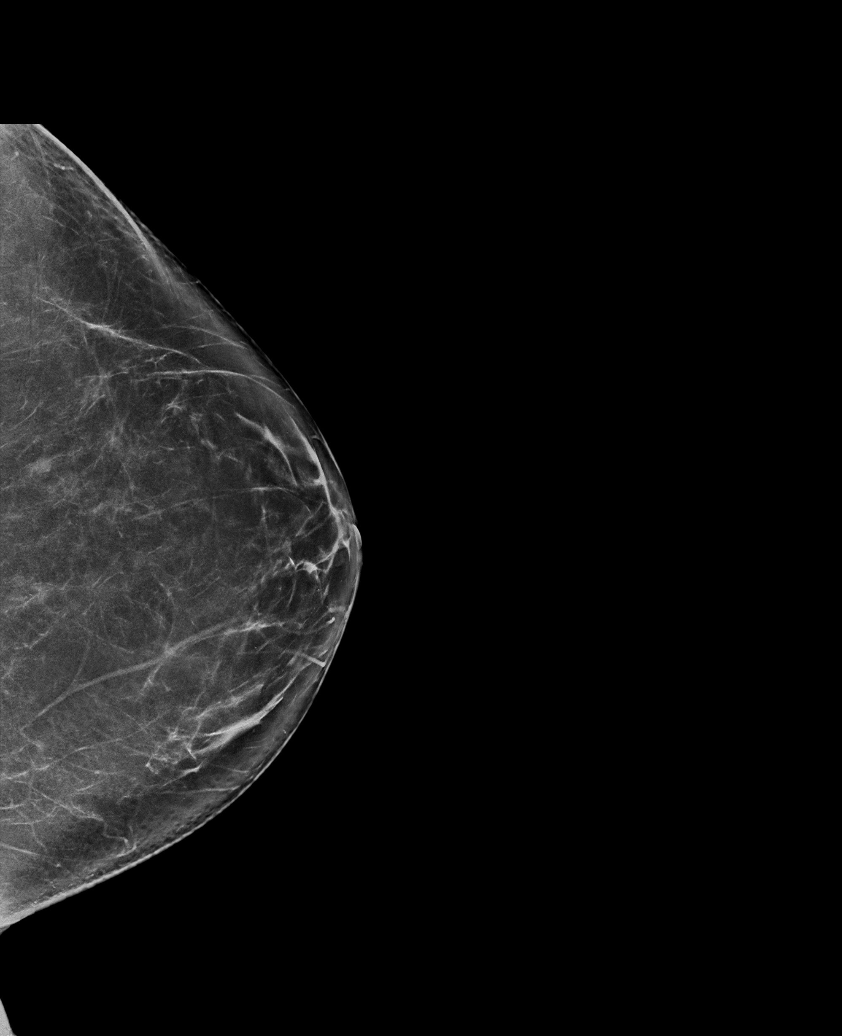

[L MLO tomo · 2 of 84 frames shown]
[frame 28/84]
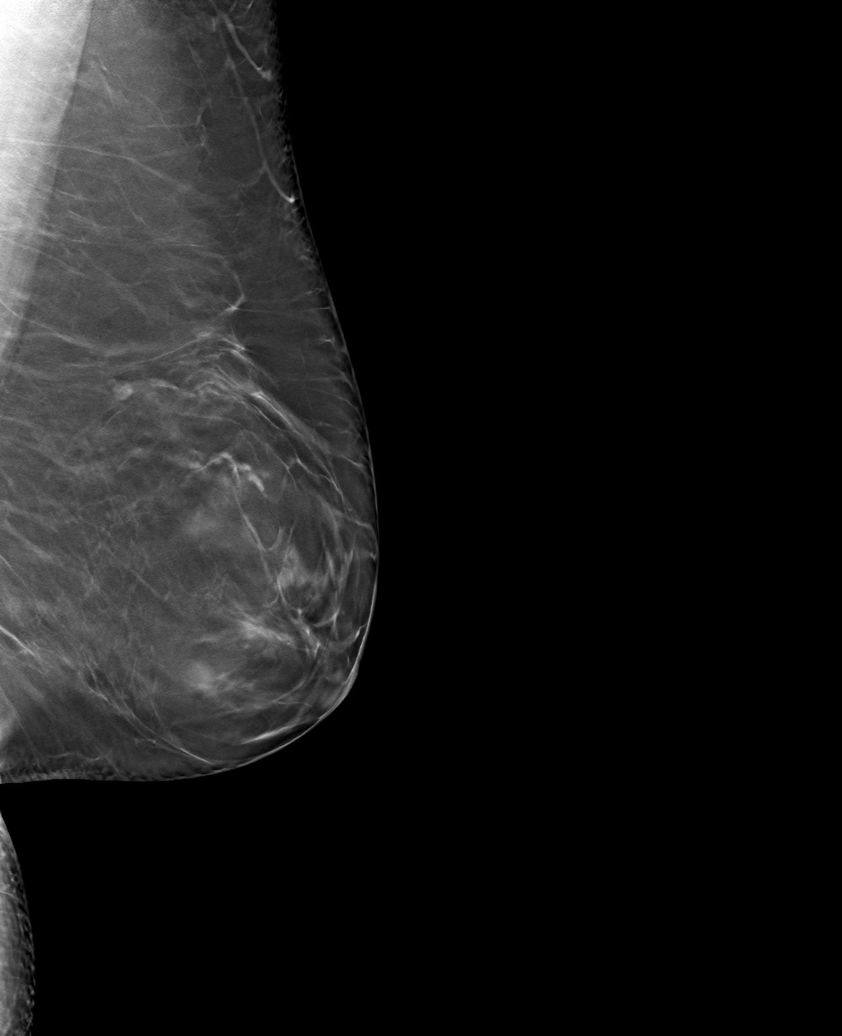
[frame 43/84]
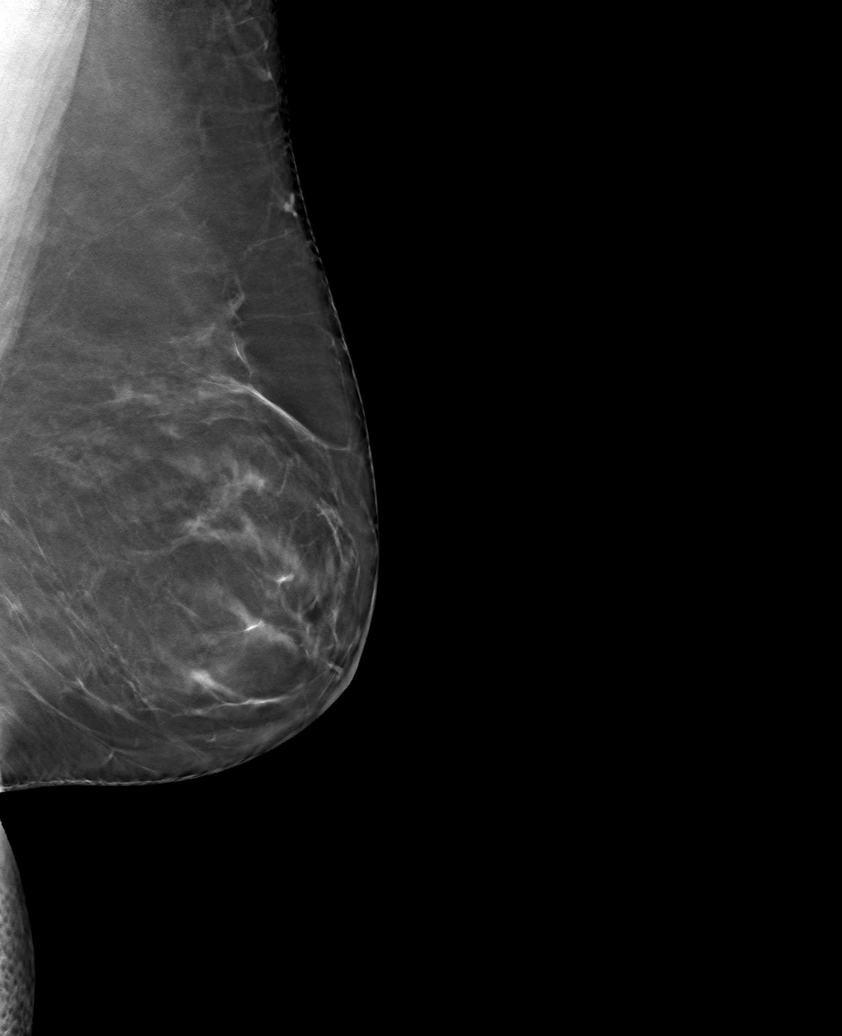

[L CC tomo · tomo slice 41/80.0]
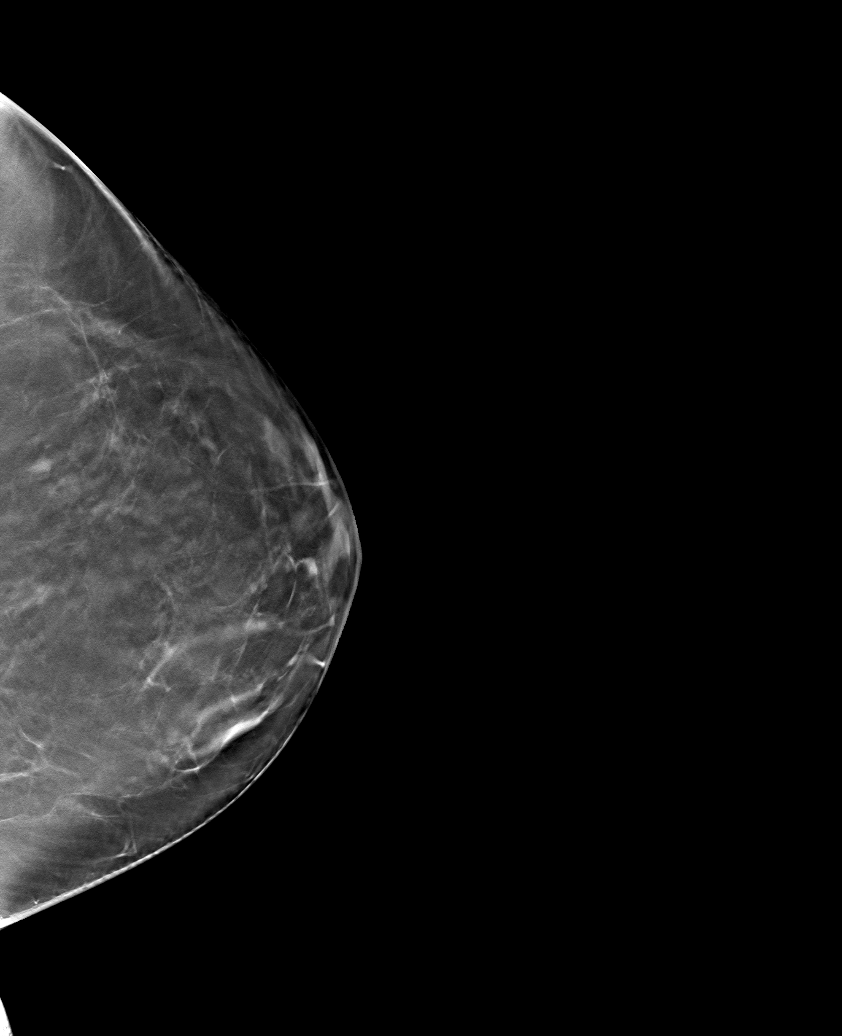

[5 of 12 positions shown; findings below may reference images not displayed]

ACR Breast Density Category b: There are scattered areas of
fibroglandular density.
FINDINGS: The oval circumscribed low-density mass within the outer LEFT breast
is stable mammographically. There are no new dominant masses,
suspicious calcifications or secondary signs of malignancy within
the LEFT breast.

Targeted ultrasound is performed, again showing an oval hypoechoic
mass in the LEFT breast at the 2 o'clock axis, 5 cm from the nipple,
stable to slightly smaller compared to the previous ultrasound,
currently measuring 3 x 1 x 2 mm.

Now also appreciated is a thin tubular structure immediately
adjacent to this hypoechoic mass, measuring 2 mm greatest thickness
and measuring 6 mm extent. The aforementioned previously
demonstrated mass and this newly appreciated tubular structure
overall measure 13 mm extent. This is suspected to represent a
benign cluster of microcysts or a benign intramammary lymph node
with adjacent blood vessel or mildly prominent duct.
IMPRESSION: Probably benign cluster of microcysts versus intramammary lymph node
with adjacent blood vessel or mildly prominent duct. Corresponding
mammographic findings are stable. Recommend additional follow-up
diagnostic mammogram in 6 months to ensure continued stability and
follow-up ultrasound at that time to ensure stability of today's
sonographic appearance.

RECOMMENDATION:
1. LEFT breast diagnostic mammogram and ultrasound in 6 months.
2. Patient describes a family history of breast cancer. Consider
genetics consultation. Also, given patient's family history of
breast cancer, would consider the addition of annual screening
breast MRI to annual screening mammography. Per American Cancer
Society guidelines, annual screening MRI of the breasts is
recommended if a risk assessment calculation for breast cancer,
preferably using the Tyrer-Cuzick or Gail model, measures greater
than 20%.

I have discussed the findings and recommendations with the patient.
If applicable, a reminder letter will be sent to the patient
regarding the next appointment.

BI-RADS CATEGORY  3: Probably benign.

## 2023-03-24 ENCOUNTER — Other Ambulatory Visit: Payer: Self-pay

## 2023-03-24 ENCOUNTER — Emergency Department (HOSPITAL_BASED_OUTPATIENT_CLINIC_OR_DEPARTMENT_OTHER): Payer: Commercial Managed Care - PPO

## 2023-03-24 ENCOUNTER — Encounter (HOSPITAL_BASED_OUTPATIENT_CLINIC_OR_DEPARTMENT_OTHER): Payer: Self-pay | Admitting: Emergency Medicine

## 2023-03-24 DIAGNOSIS — R519 Headache, unspecified: Secondary | ICD-10-CM | POA: Diagnosis present

## 2023-03-24 DIAGNOSIS — Z5321 Procedure and treatment not carried out due to patient leaving prior to being seen by health care provider: Secondary | ICD-10-CM | POA: Insufficient documentation

## 2023-03-24 DIAGNOSIS — R200 Anesthesia of skin: Secondary | ICD-10-CM | POA: Insufficient documentation

## 2023-03-24 DIAGNOSIS — R42 Dizziness and giddiness: Secondary | ICD-10-CM | POA: Diagnosis not present

## 2023-03-24 LAB — CBC WITH DIFFERENTIAL/PLATELET
Abs Immature Granulocytes: 0.02 10*3/uL (ref 0.00–0.07)
Basophils Absolute: 0 10*3/uL (ref 0.0–0.1)
Basophils Relative: 1 %
Eosinophils Absolute: 0 10*3/uL (ref 0.0–0.5)
Eosinophils Relative: 1 %
HCT: 37.7 % (ref 36.0–46.0)
Hemoglobin: 13 g/dL (ref 12.0–15.0)
Immature Granulocytes: 0 %
Lymphocytes Relative: 42 %
Lymphs Abs: 3.4 10*3/uL (ref 0.7–4.0)
MCH: 30.7 pg (ref 26.0–34.0)
MCHC: 34.5 g/dL (ref 30.0–36.0)
MCV: 88.9 fL (ref 80.0–100.0)
Monocytes Absolute: 0.4 10*3/uL (ref 0.1–1.0)
Monocytes Relative: 5 %
Neutro Abs: 4.2 10*3/uL (ref 1.7–7.7)
Neutrophils Relative %: 51 %
Platelets: 228 10*3/uL (ref 150–400)
RBC: 4.24 MIL/uL (ref 3.87–5.11)
RDW: 13 % (ref 11.5–15.5)
WBC: 8.1 10*3/uL (ref 4.0–10.5)
nRBC: 0 % (ref 0.0–0.2)

## 2023-03-24 LAB — CBG MONITORING, ED: Glucose-Capillary: 96 mg/dL (ref 70–99)

## 2023-03-24 LAB — COMPREHENSIVE METABOLIC PANEL
ALT: 22 U/L (ref 0–44)
AST: 19 U/L (ref 15–41)
Albumin: 4.7 g/dL (ref 3.5–5.0)
Alkaline Phosphatase: 61 U/L (ref 38–126)
Anion gap: 8 (ref 5–15)
BUN: 8 mg/dL (ref 6–20)
CO2: 25 mmol/L (ref 22–32)
Calcium: 9.3 mg/dL (ref 8.9–10.3)
Chloride: 104 mmol/L (ref 98–111)
Creatinine, Ser: 0.86 mg/dL (ref 0.44–1.00)
GFR, Estimated: >60 mL/min (ref >60)
Glucose, Bld: 100 mg/dL — ABNORMAL HIGH (ref 70–99)
Potassium: 3.5 mmol/L (ref 3.5–5.1)
Sodium: 137 mmol/L (ref 135–145)
Total Bilirubin: 0.4 mg/dL (ref 0.0–1.2)
Total Protein: 7.7 g/dL (ref 6.5–8.1)

## 2023-03-24 NOTE — ED Provider Triage Note (Signed)
 Emergency Medicine Provider Triage Evaluation Note  Lori Callahan , a 38 y.o. female  was evaluated in triage.  Pt complains of headache, dizziness and unilateral left-sided numbness that started at 4 this afternoon.  Review of Systems  Positive:  Negative:   Physical Exam  BP (!) 170/104   Pulse (!) 127   Temp 97.6 F (36.4 C)   Resp 16   LMP 09/04/2020   SpO2 100%  Gen:   Awake, no distress   Resp:  Normal effort  MSK:   Moves extremities without difficulty  Other:  Negative Van, decreased sensation left forearm otherwise neuroexam without focal deficit  Medical Decision Making  Medically screening exam initiated at 9:39 PM.  Appropriate orders placed.  Lori Callahan was informed that the remainder of the evaluation will be completed by another provider, this initial triage assessment does not replace that evaluation, and the importance of remaining in the ED until their evaluation is complete.     Donnajean Lynwood DEL, PA-C 03/24/23 [id number]

## 2023-03-24 NOTE — ED Triage Notes (Signed)
 Left arm numbness started around 4PM Dizziness  No weakness, gcs15 aox4

## 2023-03-25 ENCOUNTER — Emergency Department (HOSPITAL_BASED_OUTPATIENT_CLINIC_OR_DEPARTMENT_OTHER)
Admission: EM | Admit: 2023-03-25 | Discharge: 2023-03-25 | Discharge status: Against Medical Advice | Payer: Commercial Managed Care - PPO | Attending: Emergency Medicine | Admitting: Emergency Medicine

## 2023-03-25 LAB — PREGNANCY, URINE: Preg Test, Ur: NEGATIVE

## 2023-03-27 ENCOUNTER — Telehealth: Payer: Commercial Managed Care - PPO | Admitting: Physician Assistant

## 2023-03-27 DIAGNOSIS — J111 Influenza due to unidentified influenza virus with other respiratory manifestations: Secondary | ICD-10-CM

## 2023-03-27 MED ORDER — OSELTAMIVIR PHOSPHATE 75 MG PO CAPS
75.0000 mg | ORAL_CAPSULE | Freq: Two times a day (BID) | ORAL | 0 refills | Status: AC
Start: 2023-03-27 — End: ?

## 2023-03-27 NOTE — Patient Instructions (Signed)
Daneil Dolin, thank you for joining Margaretann Loveless, PA-C for today's virtual visit.  While this provider is not your primary care provider (PCP), if your PCP is located in our provider database this encounter information will be shared with them immediately following your visit.   A [facility] MyChart account gives you access to today's visit and all your visits, tests, and labs performed at [facility] " click here if you don't have a [facility] MyChart account or go to [email or web address]  Consent: (Patient) Lori Callahan provided verbal consent for this virtual visit at the beginning of the encounter.  Current Medications:  Current Outpatient Medications:    oseltamivir (TAMIFLU) 75 MG capsule, Take 1 capsule (75 mg total) by mouth 2 (two) times daily., Disp: 10 capsule, Rfl: 0   Semaglutide-Weight Management (WEGOVY) 0.25 MG/0.5ML SOAJ, Inject 0.25 mg into the skin once a week., Disp: 2 mL, Rfl: 1   amphetamine-dextroamphetamine (ADDERALL XR) 5 MG 24 hr capsule, Take 5 mg by mouth daily. (Patient not taking: Reported on 07/02/2022), Disp: , Rfl:    busPIRone (BUSPAR) 5 MG tablet, Take 5 mg by mouth 3 (three) times daily., Disp: , Rfl:    clonazePAM (KLONOPIN) 0.5 MG tablet, TAKE 1 TABLET BY MOUTH EVERY DAY AT BEDTIME AS NEEDED FOR ANXIETY (Patient taking differently: Take 0.5 mg by mouth 2 (two) times daily. TAKE 1 TABLET BY MOUTH EVERY DAY AT BEDTIME AS NEEDED FOR ANXIETY), Disp: 30 tablet, Rfl: 2   furosemide (LASIX) 20 MG tablet, TAKE 1 TABLET BY MOUTH ONCE DAILY AS NEEDED FOR FLUID, Disp: 30 tablet, Rfl: 0   levothyroxine (SYNTHROID) 75 MCG tablet, TAKE 1 TABLET BY MOUTH EVERY DAY, Disp: 90 tablet, Rfl: 1   ondansetron (ZOFRAN-ODT) 4 MG disintegrating tablet, Take 4 mg by mouth every 8 (eight) hours as needed., Disp: , Rfl:    Oxcarbazepine (TRILEPTAL) 300 MG tablet, Take 900 mg by mouth every evening., Disp: , Rfl:    propranolol (INDERAL) 60 MG tablet, Take  30 mg by mouth 2 (two) times daily. (Patient not taking: Reported on 07/02/2022), Disp: , Rfl:    SEROQUEL 300 MG tablet, Take 300 mg by mouth at bedtime., Disp: , Rfl:    Medications ordered in this encounter:  Meds ordered this encounter  Medications   oseltamivir (TAMIFLU) 75 MG capsule    Sig: Take 1 capsule (75 mg total) by mouth 2 (two) times daily.    Dispense:  10 capsule    Refill:  0    Supervising Provider:   Merrilee Jansky [[id number]]     *If you need refills on other medications prior to your next appointment, please contact your pharmacy*  Follow-Up: Call back or seek an in-person evaluation if the symptoms worsen or if the condition fails to improve as anticipated.  [facility] [phone]  Other Instructions  Influenza, Adult Influenza is also called the flu. It's an infection that affects your respiratory tract. This includes your nose, throat, windpipe, and lungs. The flu is contagious. This means it spreads easily from person to person. It causes symptoms that are like a cold. It can also cause a high fever and body aches. What are the causes? The flu is caused by the influenza virus. You can get it by: Breathing in droplets that are in the air after an infected person coughs or sneezes. Touching something that has the virus on it and then touching your mouth, nose, or eyes.  What increases the risk? You may be more likely to get the flu if: You don't wash your hands often. You're near a lot of people during cold and flu season. You touch your mouth, eyes, or nose without washing your hands first. You don't get a flu shot each year. You may also be more at risk for the flu and serious problems, such as a lung infection called pneumonia, if: You're older than 65. You're pregnant. Your immune system is weak. Your immune system is your body's defense system. You have a long-term, or chronic, condition, such as: Heart, kidney, or lung  disease. Diabetes. A liver disorder. Asthma. You're very overweight. You have anemia. This is when you don't have enough red blood cells in your body. What are the signs or symptoms? Flu symptoms often start all of a sudden. They may last 4-14 days and include: Fever and chills. Headaches, body aches, or muscle aches. Sore throat. Cough. Runny or stuffy nose. Discomfort in your chest. Not wanting to eat as much as normal. Feeling weak or tired. Feeling dizzy. Nausea or vomiting. How is this diagnosed? The flu may be diagnosed based on your symptoms and medical history. You may also have a physical exam. A swab may be taken from your nose or throat and tested for the virus. How is this treated? If the flu is found early, you can be treated with antiviral medicine. This may be given to you by mouth or through an IV. It can help you feel less sick and get better faster. Taking care of yourself at home can also help your symptoms get better. Your health care provider may tell you to: Take over-the-counter medicines. Drink lots of fluids. The flu often goes away on its own. If you have very bad symptoms or problems caused by the flu, you may need to be treated in a hospital. Follow these instructions at home: Activity Rest as needed. Get lots of sleep. Stay home from work or school as told by your provider. Leave home only to go see your provider. Do not leave home for other reasons until you don't have a fever for 24 hours without taking medicine. Eating and drinking Take an oral rehydration solution (ORS). This is a drink that is sold at pharmacies and stores. Drink enough fluid to keep your pee pale yellow. Try to drink small amounts of clear fluids. These include water, ice chips, fruit juice mixed with water, and low-calorie sports drinks. Try to eat bland foods that are easy to digest. These include bananas, applesauce, rice, lean meats, toast, and crackers. Avoid drinks that  have a lot of sugar or caffeine in them. These include energy drinks, regular sports drinks, and soda. Do not drink alcohol. Do not eat spicy or fatty foods. General instructions     Take your medicines only as told by your provider. Use a cool mist humidifier to add moisture to the air in your home. This can make it easier for you to breathe. You should also clean the humidifier every day. To do so: Empty the water. Pour clean water in. Cover your mouth and nose when you cough or sneeze. Wash your hands with soap and water often and for at least 20 seconds. It's extra important to do so after you cough or sneeze. If you can't use soap and water, use hand sanitizer. How is this prevented?  Get a flu shot every year. Ask your provider when you should get your flu shot.  Stay away from people who are sick during fall and winter. Fall and winter are cold and flu season. Contact a health care provider if: You get new symptoms. You have chest pain. You have watery poop, also called diarrhea. You have a fever. Your cough gets worse. You start to have more mucus. You feel like you may vomit, or you vomit. Get help right away if: You become short of breath or have trouble breathing. Your skin or nails turn blue. You have very bad pain or stiffness in your neck. You get a sudden headache or pain in your face or ear. You vomit each time you eat or drink. These symptoms may be an emergency. Call 911 right away. Do not wait to see if the symptoms will go away. Do not drive yourself to the hospital. This information is not intended to replace advice given to you by your health care provider. Make sure you discuss any questions you have with your health care provider. Document Revised: 04/04/2022 Document Reviewed: 04/04/2022 Elsevier Patient Education  2024 Elsevier Inc.    If you have been instructed to have an in-person evaluation today at a local Urgent Care facility, please use the  link below. It will take you to a list of all of our available [facility], including address, phone number and hours of operation. Please do not delay care.  [facility]  If you or a family member do not have a primary care provider, use the link below to schedule a visit and establish care. When you choose a [facility] primary care physician or advanced practice provider, you gain a long-term partner in health. Find a Primary Care Provider  Learn more about [facility]'s in-office and virtual care options: [facility] - Get Care Now

## 2023-03-27 NOTE — Progress Notes (Signed)
Virtual Visit Consent   Lori Callahan, you are scheduled for a virtual visit with a [facility] provider today. Just as with appointments in the office, your consent must be obtained to participate. Your consent will be active for this visit and any virtual visit you may have with one of our providers in the next 365 days. If you have a MyChart account, a copy of this consent can be sent to you electronically.  As this is a virtual visit, video technology does not allow for your provider to perform a traditional examination. This may limit your provider's ability to fully assess your condition. If your provider identifies any concerns that need to be evaluated in person or the need to arrange testing (such as labs, EKG, etc.), we will make arrangements to do so. Although advances in technology are sophisticated, we cannot ensure that it will always work on either your end or our end. If the connection with a video visit is poor, the visit may have to be switched to a telephone visit. With either a video or telephone visit, we are not always able to ensure that we have a secure connection.  By engaging in this virtual visit, you consent to the provision of healthcare and authorize for your insurance to be billed (if applicable) for the services provided during this visit. Depending on your insurance coverage, you may receive a charge related to this service.  I need to obtain your verbal consent now. Are you willing to proceed with your visit today? Lori Callahan has provided verbal consent on 03/27/2023 for a virtual visit (video or telephone). Margaretann Loveless, PA-C  Date: 03/27/2023 11:06 AM  Virtual Visit via Video Note   I, Margaretann Loveless, connected with  Lori Callahan  ([id number], 11/11/1985) on 03/27/23 at 11:00 AM EST by a video-enabled telemedicine application and verified that I am speaking with the correct person using two identifiers.  Location: Patient: Virtual Visit Location  Patient: Home Provider: Virtual Visit Location Provider: Home Office   I discussed the limitations of evaluation and management by telemedicine and the availability of in person appointments. The patient expressed understanding and agreed to proceed.    History of Present Illness: Lori Callahan is a 38 y.o. who identifies as a female who was assigned female at birth, and is being seen today for migraine, nausea, nasal congestion.  HPI: URI  This is a new problem. The current episode started in the past 7 days. The problem has been gradually worsening. Maximum temperature: subjective fevers, chills, body aches; these symptoms started last night. The fever has been present for Less than 1 day. Associated symptoms include congestion, coughing, ear pain (intermittent), headaches, nausea, sinus pain and vomiting (once yesterday). Pertinent negatives include no chest pain, diarrhea, plugged ear sensation, rhinorrhea, sneezing, sore throat or wheezing. Associated symptoms comments: Decreased appetite and intake due to nausea, post nasal drainage. She has tried nothing for the symptoms. The treatment provided no relief.    Went to ER on 03/24/23-03/25/23 for headache, dizziness and numbness. Labs unremarkable, Head CT normal, Urine pregnancy negative.   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 (Patient not taking: Reported on 07/02/2022), Disp: , Rfl:    busPIRone (BUSPAR) 5 MG tablet, Take 5 mg by mouth 3 (three) times daily., Disp: , Rfl:    clonazePAM (KLONOPIN) 0.5 MG tablet, TAKE 1 TABLET BY MOUTH EVERY DAY AT BEDTIME AS NEEDED FOR ANXIETY (Patient taking differently: Take 0.5 mg by mouth 2 (two) times daily. TAKE 1 TABLET BY MOUTH EVERY DAY AT BEDTIME AS NEEDED FOR ANXIETY), Disp: 30 tablet, Rfl: 2   furosemide (LASIX) 20 MG tablet, TAKE 1 TABLET BY MOUTH ONCE DAILY AS NEEDED FOR FLUID, Disp: 30 tablet, Rfl: 0   levothyroxine (SYNTHROID) 75 MCG tablet, TAKE 1 TABLET BY MOUTH EVERY DAY, Disp: 90 tablet, Rfl: 1   ondansetron (ZOFRAN-ODT) 4 MG disintegrating tablet, Take 4 mg by mouth every 8 (eight) hours as needed., Disp: , Rfl:    Oxcarbazepine (TRILEPTAL) 300 MG tablet, Take 900 mg by mouth every evening., Disp: , Rfl:    propranolol (INDERAL) 60 MG tablet, Take 30 mg by mouth 2 (two) times daily. (Patient not taking: Reported on 07/02/2022), Disp: , Rfl:    SEROQUEL 300 MG tablet, Take 300 mg by mouth at bedtime., Disp: , Rfl:   Observations/Objective: Patient is well-developed, well-nourished in no acute distress.  Resting comfortably at home.  Head is normocephalic, atraumatic.  No labored breathing.  Speech is clear and coherent with logical content.  Patient is alert and oriented at baseline.    Assessment and Plan: 1. Influenza (Primary) - oseltamivir (TAMIFLU) 75 MG capsule; Take 1 capsule (75 mg total) by mouth 2 (two) times daily.  Dispense: 10 capsule; Refill: 0  - Suspected viral URI  - Possible flu as flu  A is prevalent in the community at this time - Limited testing availability that would delay appropriate treatment waiting on results - Tamiflu prescribed for possible flu - Work note provided - Push fluids - Symptomatic management OTC of choice as needed - Seek in person evaluation if symptoms worsen or fail to improve   Follow Up Instructions: I discussed the assessment and treatment plan with the patient. The patient was provided an opportunity to ask questions and all were answered. The patient agreed with the plan and demonstrated an understanding of the instructions.  A copy of instructions were sent to the patient via MyChart unless otherwise noted below.    The patient was advised to call back or seek an in-person evaluation if the symptoms worsen or if the condition fails to improve as anticipated.    Margaretann Loveless, PA-C

## 2023-05-05 ENCOUNTER — Ambulatory Visit: Payer: Medicaid Other | Admitting: Physician Assistant

## 2023-08-26 ENCOUNTER — Telehealth: Admitting: Physician Assistant

## 2023-08-26 DIAGNOSIS — R110 Nausea: Secondary | ICD-10-CM | POA: Diagnosis not present

## 2023-08-26 MED ORDER — ONDANSETRON 4 MG PO TBDP: 4.0000 mg | ORAL_TABLET | Freq: Three times a day (TID) | ORAL | 0 refills | Status: DC | PRN

## 2023-08-26 NOTE — Progress Notes (Signed)

## 2023-09-05 DIAGNOSIS — R030 Elevated blood-pressure reading, without diagnosis of hypertension: Secondary | ICD-10-CM | POA: Diagnosis not present

## 2023-09-05 DIAGNOSIS — K219 Gastro-esophageal reflux disease without esophagitis: Secondary | ICD-10-CM | POA: Diagnosis not present

## 2023-09-05 DIAGNOSIS — R079 Chest pain, unspecified: Secondary | ICD-10-CM | POA: Diagnosis not present

## 2023-10-14 DIAGNOSIS — Z0120 Encounter for dental examination and cleaning without abnormal findings: Secondary | ICD-10-CM | POA: Diagnosis not present

## 2023-10-26 DIAGNOSIS — U071 COVID-19: Secondary | ICD-10-CM | POA: Diagnosis not present

## 2023-10-26 DIAGNOSIS — R110 Nausea: Secondary | ICD-10-CM | POA: Diagnosis not present

## 2023-10-28 ENCOUNTER — Telehealth: Admitting: Emergency Medicine

## 2023-10-28 DIAGNOSIS — R0602 Shortness of breath: Secondary | ICD-10-CM

## 2023-10-28 NOTE — Patient Instructions (Signed)
  Lori Callahan, thank you for joining Jon CHRISTELLA Belt, NP for today's virtual visit.  While this provider is not your primary care provider (PCP), if your PCP is located in our provider database this encounter information will be shared with them immediately following your visit.   A [facility] MyChart account gives you access to today's visit and all your visits, tests, and labs performed at [facility]  click here if you don't have a [facility] MyChart account or go to [email or web address]  Consent: (Patient) Lori Callahan provided verbal consent for this virtual visit at the beginning of the encounter.  Current Medications:  Current Outpatient Medications:    clonazePAM  (KLONOPIN ) 0.5 MG tablet, TAKE 1 TABLET BY MOUTH EVERY DAY AT BEDTIME AS NEEDED FOR ANXIETY (Patient taking differently: Take 0.5 mg by mouth 2 (two) times daily. TAKE 1 TABLET BY MOUTH EVERY DAY AT BEDTIME AS NEEDED FOR ANXIETY), Disp: 30 tablet, Rfl: 2   ondansetron  (ZOFRAN -ODT) 4 MG disintegrating tablet, Take 1 tablet (4 mg total) by mouth every 8 (eight) hours as needed., Disp: 20 tablet, Rfl: 0   Oxcarbazepine  (TRILEPTAL ) 300 MG tablet, Take 900 mg by mouth every evening., Disp: , Rfl:    SEROQUEL  300 MG tablet, Take 300 mg by mouth at bedtime., Disp: , Rfl:    Medications ordered in this encounter:  No orders of the defined types were placed in this encounter.    *If you need refills on other medications prior to your next appointment, please contact your pharmacy*  Follow-Up: Call back or seek an in-person evaluation if the symptoms worsen or if the condition fails to improve as anticipated.  [facility] [phone]  Other Instructions  Please go to an urgent care today to get your oxygen levels checked and to have someone listen to your breathing.    If you have been instructed to have an in-person evaluation today at a local Urgent Care facility, please use the link  below. It will take you to a list of all of our available [facility], including address, phone number and hours of operation. Please do not delay care.  [facility]  If you or a family member do not have a primary care provider, use the link below to schedule a visit and establish care. When you choose a [facility] primary care physician or advanced practice provider, you gain a long-term partner in health. Find a Primary Care Provider  Learn more about [facility] in-office and virtual care options:  - Get Care Now

## 2023-10-28 NOTE — Progress Notes (Signed)
 Virtual Visit Consent   DONIELLE Callahan, you are scheduled for a virtual visit with a [facility] provider today. Just as with appointments in the office, your consent must be obtained to participate. Your consent will be active for this visit and any virtual visit you may have with one of our providers in the next 365 days. If you have a MyChart account, a copy of this consent can be sent to you electronically.  As this is a virtual visit, video technology does not allow for your provider to perform a traditional examination. This may limit your provider's ability to fully assess your condition. If your provider identifies any concerns that need to be evaluated in person or the need to arrange testing (such as labs, EKG, etc.), we will make arrangements to do so. Although advances in technology are sophisticated, we cannot ensure that it will always work on either your end or our end. If the connection with a video visit is poor, the visit may have to be switched to a telephone visit. With either a video or telephone visit, we are not always able to ensure that we have a secure connection.  By engaging in this virtual visit, you consent to the provision of healthcare and authorize for your insurance to be billed (if applicable) for the services provided during this visit. Depending on your insurance coverage, you may receive a charge related to this service.  I need to obtain your verbal consent now. Are you willing to proceed with your visit today? Lori Callahan has provided verbal consent on 10/28/2023 for a virtual visit (video or telephone). Jon CHRISTELLA Belt, NP  Date: 10/28/2023 12:19 PM   Virtual Visit via Video Note   I, Jon CHRISTELLA Belt, connected with  MARIADEL Callahan  ([id number], April 10, 1985) on 10/28/23 at 12:15 PM EDT by a video-enabled telemedicine application and verified that I am speaking with the correct person using two identifiers.  Location: Patient: Virtual Visit Location Patient:  Home Provider: Virtual Visit Location Provider: Home Office   I discussed the limitations of evaluation and management by telemedicine and the availability of in person appointments. The patient expressed understanding and agreed to proceed.    History of Present Illness: Lori Callahan is a 38 y.o. who identifies as a female who was assigned female at birth, and is being seen today for SOB.  Husband had covid, now pt is sick. She assumes she has covid. Sick x 5 days. No respiratory sx until 2 days ago.   Stuffy nose - using afrin. Does have post nasal drainage Cough sometimes productive, she has not looked at sputum No fever or body aches anymore - had first couple of days.   Feels winded but does not think it is severe, even at rest. Is constantly slightly short of breath. Can lay down to sleep but does feel SOB worsens when she lays down  Is not able to check SpO2 at home   HPI: HPI  Problems:  Patient Active Problem List   Diagnosis Date Noted   Palpitations 09/13/2021   Mastalgia 09/05/2021   Genetic testing 03/13/2021   Family history of breast cancer 02/22/2021   S/P laparoscopic hysterectomy 09/18/2020   MRSA (methicillin resistant Staphylococcus aureus) infection 06/21/2020   Acquired hypothyroidism 06/09/2020   Nevus of toe of left foot 05/02/2020   Aortic atherosclerosis (HCC) 04/11/2020   Malignant melanoma of left foot (HCC) 03/17/2020   Bipolar 2 disorder (HCC) 05/25/2019   Anxiety  05/25/2019    Allergies:  Allergies  Allergen Reactions   Reglan  [Metoclopramide ] Shortness Of Breath   Medications:  Current Outpatient Medications:    clonazePAM  (KLONOPIN ) 0.5 MG tablet, TAKE 1 TABLET BY MOUTH EVERY DAY AT BEDTIME AS NEEDED FOR ANXIETY (Patient taking differently: Take 0.5 mg by mouth 2 (two) times daily. TAKE 1 TABLET BY MOUTH EVERY DAY AT BEDTIME AS NEEDED FOR ANXIETY), Disp: 30 tablet, Rfl: 2   ondansetron  (ZOFRAN -ODT) 4 MG disintegrating tablet, Take 1  tablet (4 mg total) by mouth every 8 (eight) hours as needed., Disp: 20 tablet, Rfl: 0   Oxcarbazepine  (TRILEPTAL ) 300 MG tablet, Take 900 mg by mouth every evening., Disp: , Rfl:    SEROQUEL  300 MG tablet, Take 300 mg by mouth at bedtime., Disp: , Rfl:   Observations/Objective: Patient is well-developed, well-nourished in no acute distress.  Resting comfortably  at home.  Head is normocephalic, atraumatic.  No labored breathing. She does not appear to be to be severely SOB on video Speech is clear and coherent with logical content.  Patient is alert and oriented at baseline.    Assessment and Plan: 1. SOB (shortness of breath) (Primary)  Needs in person eval to check spo2 and listen to lungs  Follow Up Instructions: I discussed the assessment and treatment plan with the patient. The patient was provided an opportunity to ask questions and all were answered. The patient agreed with the plan and demonstrated an understanding of the instructions.  A copy of instructions were sent to the patient via MyChart unless otherwise noted below.   The patient was advised to call back or seek an in-person evaluation if the symptoms worsen or if the condition fails to improve as anticipated.    Jon CHRISTELLA Belt, NP

## 2023-11-05 ENCOUNTER — Ambulatory Visit: Admitting: Physician Assistant

## 2023-11-14 ENCOUNTER — Ambulatory Visit (INDEPENDENT_AMBULATORY_CARE_PROVIDER_SITE_OTHER): Admitting: Family

## 2023-11-14 VITALS — BP 112/80 | HR 91 | Temp 97.7°F | Ht 65.0 in | Wt 241.4 lb

## 2023-11-14 DIAGNOSIS — T148XXA Other injury of unspecified body region, initial encounter: Secondary | ICD-10-CM
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
## 2023-11-21 ENCOUNTER — Encounter: Payer: Self-pay | Admitting: Physician Assistant

## 2023-11-21 ENCOUNTER — Ambulatory Visit (INDEPENDENT_AMBULATORY_CARE_PROVIDER_SITE_OTHER): Admitting: Physician Assistant

## 2023-11-21 VITALS — BP 120/84 | HR 100 | Temp 98.4°F | Ht 65.0 in | Wt 240.4 lb

## 2023-11-21 DIAGNOSIS — S31109D Unspecified open wound of abdominal wall, unspecified quadrant without penetration into peritoneal cavity, subsequent encounter: Secondary | ICD-10-CM

## 2023-11-21 DIAGNOSIS — S31109A Unspecified open wound of abdominal wall, unspecified quadrant without penetration into peritoneal cavity, initial encounter: Secondary | ICD-10-CM

## 2023-11-21 MED ORDER — MUPIROCIN 2 % EX OINT: 1.0000 | TOPICAL_OINTMENT | Freq: Two times a day (BID) | CUTANEOUS | 0 refills | Status: DC

## 2023-11-21 MED ORDER — DOXYCYCLINE HYCLATE 100 MG PO TABS: 100.0000 mg | ORAL_TABLET | Freq: Two times a day (BID) | ORAL | 0 refills | Status: DC
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
## 2023-12-02 ENCOUNTER — Encounter: Payer: Self-pay | Admitting: Dermatology

## 2023-12-02 ENCOUNTER — Ambulatory Visit: Admitting: Dermatology

## 2023-12-02 ENCOUNTER — Other Ambulatory Visit: Payer: Self-pay | Admitting: Dermatology

## 2023-12-02 VITALS — BP 119/85

## 2023-12-02 DIAGNOSIS — L578 Other skin changes due to chronic exposure to nonionizing radiation: Secondary | ICD-10-CM

## 2023-12-02 DIAGNOSIS — D1801 Hemangioma of skin and subcutaneous tissue: Secondary | ICD-10-CM | POA: Diagnosis not present

## 2023-12-02 DIAGNOSIS — L814 Other melanin hyperpigmentation: Secondary | ICD-10-CM

## 2023-12-02 DIAGNOSIS — L0291 Cutaneous abscess, unspecified: Secondary | ICD-10-CM | POA: Diagnosis not present

## 2023-12-02 DIAGNOSIS — D2271 Melanocytic nevi of right lower limb, including hip: Secondary | ICD-10-CM

## 2023-12-02 DIAGNOSIS — D2272 Melanocytic nevi of left lower limb, including hip: Secondary | ICD-10-CM | POA: Diagnosis not present

## 2023-12-02 DIAGNOSIS — Z1283 Encounter for screening for malignant neoplasm of skin: Secondary | ICD-10-CM | POA: Diagnosis not present

## 2023-12-02 DIAGNOSIS — L821 Other seborrheic keratosis: Secondary | ICD-10-CM

## 2023-12-02 DIAGNOSIS — L732 Hidradenitis suppurativa: Secondary | ICD-10-CM

## 2023-12-02 DIAGNOSIS — Z85820 Personal history of malignant melanoma of skin: Secondary | ICD-10-CM

## 2023-12-02 DIAGNOSIS — W908XXA Exposure to other nonionizing radiation, initial encounter: Secondary | ICD-10-CM | POA: Diagnosis not present

## 2023-12-02 DIAGNOSIS — D485 Neoplasm of uncertain behavior of skin: Secondary | ICD-10-CM

## 2023-12-02 MED ORDER — DOXYCYCLINE HYCLATE 100 MG PO TABS
100.0000 mg | ORAL_TABLET | Freq: Two times a day (BID) | ORAL | 2 refills | Status: DC
Start: 2023-12-02 — End: 2024-01-16

## 2023-12-02 MED ORDER — MUPIROCIN 2 % EX OINT
1.0000 | TOPICAL_OINTMENT | Freq: Two times a day (BID) | CUTANEOUS | 2 refills | Status: DC
Start: 2023-12-02 — End: 2024-01-16

## 2023-12-02 NOTE — Progress Notes (Unsigned)
 New Patient Visit   Subjective  Lori Callahan is a 38 y.o. female who presents for the following: Open sore of abdomen at c-section scar that never completely goes away. It abscesses then bursts then heal and the cycle starts over. The scar is 38 years old. She noticed the sore earlier this year. Sometimes it is more painful than others. She noticed today that there is a hole. She recently noticed a similar bump under left axilla and under right breast. 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 It was removed by a surgical oncologist. She had lymphoscintigraphy of left groin. 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  The following portions of the chart were reviewed this encounter and updated as appropriate: medications, allergies, medical history  Review of Systems:  No other skin or systemic complaints except as noted in HPI or Assessment and Plan.  Objective  Well appearing patient in no apparent distress; mood and affect are within normal limits.   A focused examination was performed of the following areas: abdomen   Relevant exam findings are noted in the Assessment and Plan.  Left Lower Leg - Anterior 3 mm irregular brown macule  Right post lat thigh 5 mm irregular brown macule  Assessment & Plan   LENTIGINES, SEBORRHEIC KERATOSES, HEMANGIOMAS - Benign normal skin lesions - Benign-appearing - Call for any changes  MELANOCYTIC NEVI - Tan-brown and/or pink-flesh-colored symmetric macules and papules - Benign appearing on exam today - Observation - Call clinic for new or changing moles - Recommend daily use of broad spectrum spf 30+ sunscreen to sun-exposed areas.   ACTINIC DAMAGE - Chronic condition, secondary to cumulative UV/sun exposure - diffuse scaly erythematous macules with underlying dyspigmentation - Recommend daily broad spectrum sunscreen SPF 30+ to sun-exposed areas, reapply every 2 hours as needed.   - Staying in the shade or wearing long sleeves, sun glasses (UVA+UVB protection) and wide brim hats (4-inch brim around the entire circumference of the hat) are also recommended for sun protection.  - Call for new or changing lesions.  SKIN CANCER SCREENING PERFORMED TODAY   HISTORY OF MELANOMA OF LEFT DORSUM FOOT - 4 mm round plaque - No evidence of recurrence today - No lymphadenopathy - Recommend regular full body skin exams - Recommend daily broad spectrum sunscreen SPF 30+ to sun-exposed areas, reapply every 2 hours as needed.  - Call if any new or changing lesions are noted between office visits  HIDRADENITIS SUPPURATIVA HURLEY STAGE: 2  Hidradenitis Suppurativa is a chronic; persistent; non-curable, but treatable condition due to abnormal inflamed sweat glands in the body folds (axilla, inframammary, groin, medial thighs), causing recurrent painful draining cysts and scarring. It can be associated with severe scarring acne and cysts; also abscesses and scarring of scalp. The goal is control and prevention of flares, as it is not curable. Scars are permanent and can be thickened. Treatment may include daily use of topical medication and oral antibiotics.  Oral isotretinoin may also be helpful.  For some cases, Humira or Cosentyx (biologic injections) may be prescribed to decrease the inflammatory process and prevent flares.  When indicated, inflamed cysts may also be treated surgically.  Treatment Plan: Recurrent abscesses and secondary infection in the lower abdomen skin fold, likely due to hidradenitis suppurativa, characterized by purulent cystic nodules with potential MRSA involvement. - Perform bacterial culture of the affected area. - Prescribe benzoyl peroxide wash to  reduce bacterial contact. - Prescribe mupirocin  ointment to cover MRSA and promote healing. - Prescribe doxycycline  100 mg orally twice daily for 14 days to address infection and inflammation. - Advise washing  under breasts, arms, and abdomen with benzoyl peroxide. - Discuss dietary modifications to reduce inflammation, including reducing sugar, starch, and carbs, and increasing intake of anti-inflammatory foods like spinach, kale, blueberries, and raspberries. - Discuss weight management and exercise to reduce frequency and intensity of flares. - Plan to discuss long-term management and potential oral medications at the next visit in three months.   NEOPLASM OF UNCERTAIN BEHAVIOR OF SKIN (2) Left Lower Leg - Anterior Epidermal / dermal shaving  Lesion diameter (cm):  0.3 Informed consent: discussed and consent obtained   Timeout: patient name, date of birth, surgical site, and procedure verified   Procedure prep:  Patient was prepped and draped in usual sterile fashion Prep type:  Isopropyl alcohol Anesthesia: the lesion was anesthetized in a standard fashion   Anesthetic:  1% lidocaine  w/ epinephrine 1-100,000 buffered w/ 8.4% NaHCO3 Instrument used: flexible razor blade   Hemostasis achieved with: pressure, aluminum chloride and electrodesiccation   Outcome: patient tolerated procedure well   Post-procedure details: sterile dressing applied and wound care instructions given   Dressing type: bandage and petrolatum     Specimen 1 - Surgical pathology Differential Diagnosis: Nevus vs dysplastic nevus  Check Margins: No Right post lat thigh Epidermal / dermal shaving  Lesion diameter (cm):  0.5 Informed consent: discussed and consent obtained   Timeout: patient name, date of birth, surgical site, and procedure verified   Procedure prep:  Patient was prepped and draped in usual sterile fashion Prep type:  Isopropyl alcohol Anesthesia: the lesion was anesthetized in a standard fashion   Anesthetic:  1% lidocaine  w/ epinephrine 1-100,000 buffered w/ 8.4% NaHCO3 Instrument used: flexible razor blade   Hemostasis achieved with: pressure, aluminum chloride and electrodesiccation   Outcome:  patient tolerated procedure well   Post-procedure details: sterile dressing applied and wound care instructions given   Dressing type: bandage and petrolatum     Specimen 2 - Surgical pathology Differential Diagnosis: Nevus vs dysplastic nevus  Check Margins: No ABSCESS Suprapubic Area doxycycline  (VIBRA -TABS) 100 MG tablet - Suprapubic Area Take 1 tablet (100 mg total) by mouth 2 (two) times daily. Take for 14 days for each flare. Take with food and plenty of fluid  mupirocin  ointment (BACTROBAN ) 2 % - Suprapubic Area Apply 1 Application topically 2 (two) times daily.  Aerobic Culture - Suprapubic Area  Return in about 3 months (around 03/02/2024) for TBSE, history of Melanoma.  I, Roseline Hutchinson, CMA, am acting as scribe for [employer], DO .   Documentation: I have reviewed the above documentation for accuracy and completeness, and I agree with the above.  Delon Lenis, DO

## 2023-12-02 NOTE — Patient Instructions (Addendum)
 VISIT SUMMARY:  Today, you had a dermatology consultation to address concerns about a scar, skin check, and recurrent skin issues. We discussed your history of melanoma, recurrent abscesses, and cysts, and performed a thorough skin examination.  YOUR PLAN:  -HIDRADENITIS SUPPURATIVA WITH RECURRENT ABSCESSES AND SECONDARY INFECTION: Hidradenitis suppurativa is a chronic skin condition that causes painful lumps under the skin, often leading to abscesses and infections. We will perform a bacterial culture of the affected area, and you are prescribed a benzoyl peroxide wash and mupirocin  ointment to reduce bacteria and promote healing. Additionally, you will take doxycycline  100 mg orally twice daily for 14 days. Please wash under your breasts, arms, and abdomen with the benzoyl peroxide wash. We also discussed dietary changes to reduce inflammation and the importance of weight management and exercise. We will discuss long-term management at your next visit in three months.  -ATYPICAL MELANOCYTIC NEVI, LEFT ANTERIOR SHIN AND RIGHT POSTERIOR LATERAL THIGH, PENDING BIOPSY: Atypical melanocytic nevi are unusual moles that may resemble melanoma. We performed a shave biopsy on the lesions on your left anterior shin and right posterior lateral thigh to rule out malignancy. Please keep the biopsy areas clean with soap and water, apply Aquaphor or Vaseline, and cover with a Band-Aid for about two weeks. We will communicate the biopsy results via MyChart.  -PERSONAL HISTORY OF MALIGNANT MELANOMA OF SKIN, STATUS POST EXCISION, LEFT DORSAL FOOT: You have a history of malignant melanoma, which was removed from your left dorsal foot in February 2022. There are no signs of recurrence, but you are at high risk for developing another melanoma. We conducted a full body skin examination and recommend skin checks every six months. Please use sunscreen daily, especially Centella sunscreen for your face, neck, and chest. We also  discussed the importance of self-examination and early detection of suspicious lesions.  INSTRUCTIONS:  Please follow up in three months for a discussion on long-term management of hidradenitis suppurativa. We will communicate your biopsy results via MyChart. Continue regular skin checks every six months and use sunscreen daily.     Patient Handout: Wound Care for Skin Biopsy Site  Taking Care of Your Skin Biopsy Site  Proper care of the biopsy site is essential for promoting healing and minimizing scarring. This handout provides instructions on how to care for your biopsy site to ensure optimal recovery.  1. Cleaning the Wound:  Clean the biopsy site daily with gentle soap and water. Gently pat the area dry with a clean, soft towel. Avoid harsh scrubbing or rubbing the area, as this can irritate the skin and delay healing.  2. Applying Aquaphor and Bandage:  After cleaning the wound, apply a thin layer of Aquaphor ointment to the biopsy site. Cover the area with a sterile bandage to protect it from dirt, bacteria, and friction. Change the bandage daily or as needed if it becomes soiled or wet.  3. Continued Care for One Week:  Repeat the cleaning, Aquaphor application, and bandaging process daily for one week following the biopsy procedure. Keeping the wound clean and moist during this initial healing period will help prevent infection and promote optimal healing.  4. Massaging Aquaphor into the Area:  ---After one week, discontinue the use of bandages but continue to apply Aquaphor to the biopsy site. ----Gently massage the Aquaphor into the area using circular motions. ---Massaging the skin helps to promote circulation and prevent the formation of scar tissue.   Additional Tips:  Avoid exposing the biopsy site to direct  sunlight during the healing process, as this can cause hyperpigmentation or worsen scarring. If you experience any signs of infection, such as increased  redness, swelling, warmth, or drainage from the wound, contact your healthcare provider immediately. Follow any additional instructions provided by your healthcare provider for caring for the biopsy site and managing any discomfort. Conclusion:  Taking proper care of your skin biopsy site is crucial for ensuring optimal healing and minimizing scarring. By following these instructions for cleaning, applying Aquaphor, and massaging the area, you can promote a smooth and successful recovery. If you have any questions or concerns about caring for your biopsy site, don't hesitate to contact your healthcare provider for guidance.     Important Information  Due to recent changes in healthcare laws, you may see results of your pathology and/or laboratory studies on MyChart before the doctors have had a chance to review them. We understand that in some cases there may be results that are confusing or concerning to you. Please understand that not all results are received at the same time and often the doctors may need to interpret multiple results in order to provide you with the best plan of care or course of treatment. Therefore, we ask that you please give us  2 business days to thoroughly review all your results before contacting the office for clarification. Should we see a critical lab result, you will be contacted sooner.   If You Need Anything After Your Visit  If you have any questions or concerns for your doctor, please call our main line at [phone] If no one answers, please leave a voicemail as directed and we will return your call as soon as possible. Messages left after 4 pm will be answered the following business day.   You may also send us  a message via MyChart. We typically respond to MyChart messages within 1-2 business days.  For prescription refills, please ask your pharmacy to contact our office. Our fax number is [phone].  If you have an urgent issue when the clinic is closed  that cannot wait until the next business day, you can page your doctor at the number below.    Please note that while we do our best to be available for urgent issues outside of office hours, we are not available 24/7.   If you have an urgent issue and are unable to reach us , you may choose to seek medical care at your doctor's office, retail clinic, urgent care center, or emergency room.  If you have a medical emergency, please immediately call 911 or go to the emergency department. In the event of inclement weather, please call our main line at [phone] for an update on the status of any delays or closures.  Dermatology Medication Tips: Please keep the boxes that topical medications come in in order to help keep track of the instructions about where and how to use these. Pharmacies typically print the medication instructions only on the boxes and not directly on the medication tubes.   If your medication is too expensive, please contact our office at [phone] or send us  a message through MyChart.   We are unable to tell what your co-pay for medications will be in advance as this is different depending on your insurance coverage. However, we may be able to find a substitute medication at lower cost or fill out paperwork to get insurance to cover a needed medication.   If a prior authorization is required to get your medication covered by  your insurance company, please allow us  1-2 business days to complete this process.  Drug prices often vary depending on where the prescription is filled and some pharmacies may offer cheaper prices.  The website www.goodrx.com contains coupons for medications through different pharmacies. The prices here do not account for what the cost may be with help from insurance (it may be cheaper with your insurance), but the website can give you the price if you did not use any insurance.  - You can print the associated coupon and take it with your prescription  to the pharmacy.  - You may also stop by our office during regular business hours and pick up a GoodRx coupon card.  - If you need your prescription sent electronically to a different pharmacy, notify our office through [facility] or by phone at [phone]

## 2023-12-03 ENCOUNTER — Encounter: Payer: Self-pay | Admitting: Dermatology

## 2023-12-03 LAB — SURGICAL PATHOLOGY

## 2023-12-04 ENCOUNTER — Ambulatory Visit: Payer: Self-pay | Admitting: Dermatology

## 2023-12-05 LAB — AEROBIC CULTURE

## 2023-12-10 ENCOUNTER — Other Ambulatory Visit: Payer: Self-pay | Admitting: Physician Assistant

## 2023-12-10 ENCOUNTER — Ambulatory Visit: Payer: Self-pay | Admitting: Dermatology

## 2023-12-10 DIAGNOSIS — Z1231 Encounter for screening mammogram for malignant neoplasm of breast: Secondary | ICD-10-CM

## 2023-12-25 ENCOUNTER — Encounter: Payer: Self-pay | Admitting: Dermatology

## 2024-01-14 ENCOUNTER — Encounter: Payer: Self-pay | Admitting: Cardiology

## 2024-01-14 DIAGNOSIS — C4372 Malignant melanoma of left lower limb, including hip: Secondary | ICD-10-CM | POA: Diagnosis not present
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
## 2024-01-16 ENCOUNTER — Other Ambulatory Visit (HOSPITAL_COMMUNITY): Payer: Self-pay

## 2024-01-16 ENCOUNTER — Ambulatory Visit (HOSPITAL_COMMUNITY)
Admission: RE | Admit: 2024-01-16 | Discharge: 2024-01-16 | Disposition: A | Discharge status: Home / Self Care | Payer: Self-pay | Source: Ambulatory Visit | Attending: Cardiology | Admitting: Cardiology

## 2024-01-16 ENCOUNTER — Ambulatory Visit: Payer: Self-pay | Admitting: Cardiology

## 2024-01-16 ENCOUNTER — Encounter: Payer: Self-pay | Admitting: Cardiology

## 2024-01-16 ENCOUNTER — Ambulatory Visit: Attending: Cardiology | Admitting: Cardiology

## 2024-01-16 VITALS — BP 124/74 | HR 103 | Ht 65.0 in | Wt 241.2 lb

## 2024-01-16 DIAGNOSIS — I700 Atherosclerosis of aorta: Secondary | ICD-10-CM | POA: Insufficient documentation

## 2024-01-16 MED ORDER — PROPRANOLOL HCL 10 MG PO TABS: 10.0000 mg | ORAL_TABLET | Freq: Three times a day (TID) | ORAL | 3 refills | Status: AC | PRN

## 2024-01-16 MED ORDER — PROPRANOLOL HCL 10 MG PO TABS: 10.0000 mg | ORAL_TABLET | Freq: Three times a day (TID) | ORAL | 3 refills | Status: DC

## 2024-01-16 NOTE — Patient Instructions (Addendum)
 Medication Instructions:  Take propranolol 10 mg up to 3 times per day for palpitations  *If you need a refill on your cardiac medications before your next appointment, please call your pharmacy*  Lab Work: NONE If you have labs (blood work) drawn today and your tests are completely normal, you will receive your results only by: MyChart Message (if you have MyChart) OR A paper copy in the mail If you have any lab test that is abnormal or we need to change your treatment, we will call you to review the results.  Testing/Procedures: Coronary Calcium Score Your physician has requested that you have a coronary calcium score performed. This is not covered by insurance and will be an out-of-pocket cost of approximately $99.   Follow-Up: At [facility], you and your health needs are our priority.  As part of our continuing mission to provide you with exceptional heart care, our providers are all part of one team.  This team includes your primary Cardiologist (physician) and Advanced Practice Providers or APPs (Physician Assistants and Nurse Practitioners) who all work together to provide you with the care you need, when you need it.  Your next appointment:   As needed  Provider:   L[place], MD  We recommend signing up for the patient portal called MyChart.  Sign up information is provided on this After Visit Summary.  MyChart is used to connect with patients for Virtual Visits (Telemedicine).  Patients are able to view lab/test results, encounter notes, upcoming appointments, etc.  Non-urgent messages can be sent to your provider as well.   To learn more about what you can do with MyChart, go to [email or web address].

## 2024-02-11 DIAGNOSIS — R42 Dizziness and giddiness: Secondary | ICD-10-CM | POA: Diagnosis not present

## 2024-02-11 DIAGNOSIS — C4372 Malignant melanoma of left lower limb, including hip: Secondary | ICD-10-CM | POA: Diagnosis not present

## 2024-02-20 ENCOUNTER — Encounter: Admitting: Physician Assistant

## 2024-02-22 DIAGNOSIS — R110 Nausea: Secondary | ICD-10-CM | POA: Diagnosis not present

## 2024-02-22 DIAGNOSIS — R519 Headache, unspecified: Secondary | ICD-10-CM | POA: Diagnosis not present

## 2024-02-24 ENCOUNTER — Ambulatory Visit: Admitting: Physician Assistant

## 2024-02-26 ENCOUNTER — Encounter: Payer: Self-pay | Admitting: Physician Assistant

## 2024-02-26 ENCOUNTER — Ambulatory Visit: Admitting: Physician Assistant

## 2024-02-26 VITALS — BP 98/70 | HR 84 | Temp 97.3°F | Resp 14 | Ht 65.0 in | Wt 239.6 lb

## 2024-02-26 DIAGNOSIS — R946 Abnormal results of thyroid function studies: Secondary | ICD-10-CM | POA: Insufficient documentation

## 2024-02-26 DIAGNOSIS — R002 Palpitations: Secondary | ICD-10-CM

## 2024-02-26 DIAGNOSIS — R6882 Decreased libido: Secondary | ICD-10-CM | POA: Insufficient documentation

## 2024-02-26 DIAGNOSIS — R454 Irritability and anger: Secondary | ICD-10-CM | POA: Insufficient documentation

## 2024-02-26 DIAGNOSIS — F909 Attention-deficit hyperactivity disorder, unspecified type: Secondary | ICD-10-CM | POA: Insufficient documentation

## 2024-02-26 DIAGNOSIS — L732 Hidradenitis suppurativa: Secondary | ICD-10-CM | POA: Diagnosis not present

## 2024-02-26 DIAGNOSIS — M25552 Pain in left hip: Secondary | ICD-10-CM

## 2024-02-26 DIAGNOSIS — M25571 Pain in right ankle and joints of right foot: Secondary | ICD-10-CM | POA: Diagnosis not present

## 2024-02-26 DIAGNOSIS — M25551 Pain in right hip: Secondary | ICD-10-CM | POA: Diagnosis not present

## 2024-02-26 DIAGNOSIS — M2550 Pain in unspecified joint: Secondary | ICD-10-CM | POA: Diagnosis not present

## 2024-02-26 DIAGNOSIS — G479 Sleep disorder, unspecified: Secondary | ICD-10-CM | POA: Insufficient documentation

## 2024-02-26 DIAGNOSIS — E66812 Obesity, class 2: Secondary | ICD-10-CM | POA: Insufficient documentation

## 2024-02-26 DIAGNOSIS — R5383 Other fatigue: Secondary | ICD-10-CM | POA: Diagnosis not present

## 2024-02-26 DIAGNOSIS — R232 Flushing: Secondary | ICD-10-CM | POA: Insufficient documentation

## 2024-02-26 DIAGNOSIS — E039 Hypothyroidism, unspecified: Secondary | ICD-10-CM

## 2024-02-26 DIAGNOSIS — M25572 Pain in left ankle and joints of left foot: Secondary | ICD-10-CM

## 2024-02-26 DIAGNOSIS — E079 Disorder of thyroid, unspecified: Secondary | ICD-10-CM | POA: Diagnosis not present

## 2024-02-26 DIAGNOSIS — Z789 Other specified health status: Secondary | ICD-10-CM | POA: Insufficient documentation

## 2024-02-26 DIAGNOSIS — E559 Vitamin D deficiency, unspecified: Secondary | ICD-10-CM | POA: Insufficient documentation

## 2024-02-26 DIAGNOSIS — N898 Other specified noninflammatory disorders of vagina: Secondary | ICD-10-CM | POA: Insufficient documentation

## 2024-02-26 DIAGNOSIS — N951 Menopausal and female climacteric states: Secondary | ICD-10-CM | POA: Insufficient documentation

## 2024-02-26 LAB — IBC + FERRITIN
Ferritin: 39.6 ng/mL (ref 10.0–291.0)
Iron: 73 ug/dL (ref 42–145)
Saturation Ratios: 22.4 % (ref 20.0–50.0)
TIBC: 326.2 ug/dL (ref 250.0–450.0)
Transferrin: 233 mg/dL (ref 212.0–360.0)

## 2024-02-26 LAB — HEMOGLOBIN A1C: Hgb A1c MFr Bld: 5.1 % (ref 4.6–6.5)

## 2024-02-26 LAB — CBC WITH DIFFERENTIAL/PLATELET
Basophils Absolute: 0 K/uL (ref 0.0–0.1)
Basophils Relative: 0.4 % (ref 0.0–3.0)
Eosinophils Absolute: 0.1 K/uL (ref 0.0–0.7)
Eosinophils Relative: 1.1 % (ref 0.0–5.0)
HCT: 36.3 % (ref 36.0–46.0)
Hemoglobin: 12.5 g/dL (ref 12.0–15.0)
Lymphocytes Relative: 35.9 % (ref 12.0–46.0)
Lymphs Abs: 2.5 K/uL (ref 0.7–4.0)
MCHC: 34.4 g/dL (ref 30.0–36.0)
MCV: 91.2 fl (ref 78.0–100.0)
Monocytes Absolute: 0.4 K/uL (ref 0.1–1.0)
Monocytes Relative: 5 % (ref 3.0–12.0)
Neutro Abs: 4.1 K/uL (ref 1.4–7.7)
Neutrophils Relative %: 57.6 % (ref 43.0–77.0)
Platelets: 247 K/uL (ref 150.0–400.0)
RBC: 3.98 Mil/uL (ref 3.87–5.11)
RDW: 13.3 % (ref 11.5–15.5)
WBC: 7.1 K/uL (ref 4.0–10.5)

## 2024-02-26 LAB — VITAMIN B12: Vitamin B-12: 152 pg/mL — ABNORMAL LOW (ref 211–911)

## 2024-02-26 LAB — COMPREHENSIVE METABOLIC PANEL WITH GFR
ALT: 21 U/L (ref 3–35)
AST: 18 U/L (ref 5–37)
Albumin: 4.4 g/dL (ref 3.5–5.2)
Alkaline Phosphatase: 67 U/L (ref 39–117)
BUN: 9 mg/dL (ref 6–23)
CO2: 27 meq/L (ref 19–32)
Calcium: 9.2 mg/dL (ref 8.4–10.5)
Chloride: 103 meq/L (ref 96–112)
Creatinine, Ser: 0.84 mg/dL (ref 0.40–1.20)
GFR: 88.26 mL/min (ref >60.00)
Glucose, Bld: 72 mg/dL (ref 70–99)
Potassium: 3.9 meq/L (ref 3.5–5.1)
Sodium: 139 meq/L (ref 135–145)
Total Bilirubin: 0.3 mg/dL (ref 0.2–1.2)
Total Protein: 7.1 g/dL (ref 6.0–8.3)

## 2024-02-26 LAB — C-REACTIVE PROTEIN: CRP: <0.5 mg/dL — ABNORMAL LOW (ref 1.0–20.0)

## 2024-02-26 LAB — SEDIMENTATION RATE: Sed Rate: 15 mm/h (ref 0–20)

## 2024-02-26 LAB — T3, FREE: T3, Free: 2.8 pg/mL (ref 2.3–4.2)

## 2024-02-26 LAB — VITAMIN D 25 HYDROXY (VIT D DEFICIENCY, FRACTURES): VITD: 11.93 ng/mL — ABNORMAL LOW (ref 30.00–100.00)

## 2024-02-26 LAB — T4, FREE: Free T4: 0.48 ng/dL — ABNORMAL LOW (ref 0.60–1.60)

## 2024-02-26 LAB — TSH: TSH: 3.55 u[IU]/mL (ref 0.35–5.50)

## 2024-02-26 MED ORDER — CLINDAMYCIN PHOSPHATE 1 % EX SWAB: CUTANEOUS | 1 refills | Status: DC

## 2024-02-26 MED ORDER — DOXYCYCLINE HYCLATE 100 MG PO TABS: 100.0000 mg | ORAL_TABLET | Freq: Two times a day (BID) | ORAL | 0 refills | Status: DC
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
## 2024-02-28 LAB — CYCLIC CITRUL PEPTIDE ANTIBODY, IGG/IGA: Cyclic Citrullin Peptide Ab: 10 U (ref 0–19)

## 2024-02-29 ENCOUNTER — Ambulatory Visit: Payer: Self-pay | Admitting: Physician Assistant

## 2024-02-29 LAB — ANA: Anti Nuclear Antibody (ANA): POSITIVE — AB

## 2024-02-29 LAB — ANTI-NUCLEAR AB-TITER (ANA TITER): ANA Titer 1: 1:40 {titer} — ABNORMAL HIGH

## 2024-02-29 LAB — RHEUMATOID FACTOR: Rheumatoid fact SerPl-aCnc: <10 [IU]/mL

## 2024-02-29 MED ORDER — VITAMIN D (ERGOCALCIFEROL) 1.25 MG (50000 UNIT) PO CAPS: 50000.0000 [IU] | ORAL_CAPSULE | ORAL | 0 refills | Status: AC

## 2024-03-02 ENCOUNTER — Ambulatory Visit

## 2024-03-02 DIAGNOSIS — E538 Deficiency of other specified B group vitamins: Secondary | ICD-10-CM

## 2024-03-02 MED ORDER — CYANOCOBALAMIN 1000 MCG/ML IJ SOLN: INTRAMUSCULAR | 0 refills | Status: AC

## 2024-03-02 MED ORDER — CYANOCOBALAMIN 1000 MCG/ML IJ SOLN
1000.0000 ug | Freq: Once | INTRAMUSCULAR | Status: AC
  Administered 2024-03-02: 1000 ug via INTRAMUSCULAR

## 2024-03-02 NOTE — Progress Notes (Signed)
 Patient is in office today for a nurse visit for B12 Injection. Patient Injection was given in the  Right quadriceps. Patient tolerated injection well. Patient was taught how to draw up and administer B 12 injections to herself. Patient expressed that she was going to show her husband as well just incase she endured any issues. Patient was sent home with syringes, alcohol pads, gauze pads, and needles to inject with.

## 2024-03-12 ENCOUNTER — Ambulatory Visit: Payer: Self-pay | Admitting: Physician Assistant

## 2024-03-12 NOTE — Telephone Encounter (Signed)
 FYI Only or Action Required?: Action required by provider: clinical question for provider. No appts in pt region today. Unable to be seen today in person anywhere d/t obligations. Vaginal abscess ongoing/recurrent x7 years. Unable to take doxycyline prescribed at appt 2 weeks ago d/t the significant nausea it causes. Declines having it lanced d/t significant pain when having this done in the past. Pt would like to know if a different medication can be prescribed for this. Please advise.  Patient was last seen in primary care on 02/26/2024 by Job Lukes, PA.  Called Nurse Triage reporting Abscess and Groin Pain.  Symptoms began several days ago.  Interventions attempted: OTC medications: ibuprofen  and tylenol .  Symptoms are: gradually worsening.  Triage Disposition: See HCP Within 4 Hours (Or PCP Triage)  Patient/caregiver understands and will follow disposition?: No, wishes to speak with PCP     Pt has hx hidradenitis suppurativa. Hx of recurrent abscesses on vaginal area on C-section scar x7 years that fill, pop and heal. Currently with an abscess on left labia that won't pop. Red. Size of a half a ping pong ball. Onset 2 days ago. 10/10 pain.   Saw PCP 2 weeks ago for a different abscess on her labia. Prescribed doxycycline . Did not pick up/start d/t it making her very sick. Starts new job on Monday and doesn't want to take d/t the side effects and not being able to miss work during the training period. That abscess has improved but current abscess on left labia getting worse. Has appt in March with a dermatologist.   Unable to attend in person appt or UC today d/t obligations. Declines having it lanced d/t significant pain when having this done in the past. Would like to know if a different abx or medication can be prescribed. Forwarding request to office. Advised UC or ED for worsening symptoms.      Copied from CRM #[id number]. Topic: Clinical - Red Word Triage >> Mar 12, 2024  10:23 AM Antony RAMAN wrote: Red Word that prompted transfer to Nurse Triage: really bad pain in groin area due to an abscess Reason for Disposition  [1] SEVERE pain AND [2] not improved 2 hours after pain medicine  Answer Assessment - Initial Assessment Questions 1. SYMPTOM: What's the main symptom you're concerned about? (e.g., rash, itching, swelling, dryness)     Abscess on left labia  2. LOCATION: Where is the  abscess located? (e.g., inside/outside, left/right)     Left labia  3. ONSET: When did the  abscess  start?     2 days ago  4. PAIN: Is there any pain? If Yes, ask: How bad is it? (Scale: 0-10; none, mild, moderate, severe)     10/10  5. CAUSE: What do you think is causing the symptoms?     Recurrent abscess in groin  6. OTHER SYMPTOMS: Do you have any other symptoms? (e.g., fever, vaginal bleeding, pain with urination)     Denies  Protocols used: Vulvar Symptoms-A-AH

## 2024-03-15 NOTE — Telephone Encounter (Signed)
 Please call patient an schedule an appt per [facility].

## 2024-03-15 NOTE — Telephone Encounter (Signed)
 Noted

## 2024-03-15 NOTE — Telephone Encounter (Signed)
 Pt is unable to come in for a visit due to starting a new job. Will call us  back to inform us  of lunch hours so pt can schedule a virtual if needed. Pt stated she had already spoken to [facility] regarding referral to surgeon for this issue.

## 2024-03-25 ENCOUNTER — Telehealth: Admitting: Family

## 2024-03-25 DIAGNOSIS — R110 Nausea: Secondary | ICD-10-CM

## 2024-03-25 MED ORDER — ONDANSETRON 4 MG PO TBDP: 4.0000 mg | ORAL_TABLET | Freq: Three times a day (TID) | ORAL | 0 refills | Status: DC | PRN

## 2024-03-25 NOTE — Progress Notes (Signed)
 " Virtual Visit Consent   Lori Callahan, you are scheduled for a virtual visit with a [facility] provider today. Just as with appointments in the office, your consent must be obtained to participate. Your consent will be active for this visit and any virtual visit you may have with one of our providers in the next 365 days. If you have a MyChart account, a copy of this consent can be sent to you electronically.  As this is a virtual visit, video technology does not allow for your provider to perform a traditional examination. This may limit your provider's ability to fully assess your condition. If your provider identifies any concerns that need to be evaluated in person or the need to arrange testing (such as labs, EKG, etc.), we will make arrangements to do so. Although advances in technology are sophisticated, we cannot ensure that it will always work on either your end or our end. If the connection with a video visit is poor, the visit may have to be switched to a telephone visit. With either a video or telephone visit, we are not always able to ensure that we have a secure connection.  By engaging in this virtual visit, you consent to the provision of healthcare and authorize for your insurance to be billed (if applicable) for the services provided during this visit. Depending on your insurance coverage, you may receive a charge related to this service.  I need to obtain your verbal consent now. Are you willing to proceed with your visit today? Lori Callahan has provided verbal consent on 03/25/2024 for a virtual visit (video or telephone). Bari Learn, FNP  Date: 03/25/2024 5:47 PM   Virtual Visit via Video Note   I, Bari Learn, connected with  Lori Callahan  ([id number], [birth year]) on 03/25/24 at  5:45 PM EST by a video-enabled telemedicine application and verified that I am speaking with the correct person using two identifiers.  Location: Patient: Virtual Visit Location Patient:  Home Provider: Virtual Visit Location Provider: Home Office   I discussed the limitations of evaluation and management by telemedicine and the availability of in person appointments. The patient expressed understanding and agreed to proceed.    History of Present Illness: Lori Callahan is a 39 y.o. who identifies as a female who was assigned female at birth, and is being seen today for nausea that started two days on and off. Reports she has had this in the past and would use zofran  that works well for her. She has hx of hysterectomy and no possibility of pregnancy.   Denies any nausea or diarrhea.   Her has BM was this AM.   She has never seen a GI provider.   Does report she has had increase stress that could be causing the nausea.   HPI: HPI  Problems:  Patient Active Problem List   Diagnosis Date Noted   Abnormal finding on thyroid  function test 02/26/2024   Attention deficit hyperactivity disorder 02/26/2024   Class 2 obesity 02/26/2024   Difficulty sleeping 02/26/2024   Fatigue 02/26/2024   Hot flashes 02/26/2024   Irritability 02/26/2024   Low libido 02/26/2024   Menopausal and female climacteric states 02/26/2024   Morbid obesity (HCC) 02/26/2024   Other specified health status 02/26/2024   Vaginal dryness 02/26/2024   Vitamin D  deficiency 02/26/2024   Palpitations 09/13/2021   Mastalgia 09/05/2021   Genetic testing 03/13/2021   Family history of breast cancer 02/22/2021   S/P laparoscopic  hysterectomy 09/18/2020   MRSA (methicillin resistant Staphylococcus aureus) infection 06/21/2020   Hypothyroid 06/09/2020   Nevus of toe of left foot 05/02/2020   Aortic atherosclerosis 04/11/2020   Malignant melanoma of left foot (HCC) 03/17/2020   Bipolar disorder (HCC) 05/25/2019   Anxiety 05/25/2019    Allergies: Allergies[1] Medications: Current Medications[2]  Observations/Objective: Patient is well-developed, well-nourished in no acute distress.  Resting  comfortably  at home.  Head is normocephalic, atraumatic.  No labored breathing.  Speech is clear and coherent with logical content.  Patient is alert and oriented at baseline.    Assessment and Plan: 1. Nausea (Primary) - ondansetron  (ZOFRAN -ODT) 4 MG disintegrating tablet; Take 1 tablet (4 mg total) by mouth every 8 (eight) hours as needed for nausea or vomiting.  Dispense: 20 tablet; Refill: 0  Force fluids Zofran  as need  Bland diet  Recommend follow up with PCP to discuss nausea. May would benefit from GI follow up.  Stress management  Follow up if symptoms worsen or do not improve  Follow Up Instructions: I discussed the assessment and treatment plan with the patient. The patient was provided an opportunity to ask questions and all were answered. The patient agreed with the plan and demonstrated an understanding of the instructions.  A copy of instructions were sent to the patient via MyChart unless otherwise noted below.     The patient was advised to call back or seek an in-person evaluation if the symptoms worsen or if the condition fails to improve as anticipated.    Bari Learn, FNP    [1]  Allergies Allergen Reactions   Reglan  [Metoclopramide ] Shortness Of Breath  [2]  Current Outpatient Medications:    ondansetron  (ZOFRAN -ODT) 4 MG disintegrating tablet, Take 1 tablet (4 mg total) by mouth every 8 (eight) hours as needed for nausea or vomiting., Disp: 20 tablet, Rfl: 0   clindamycin  (CLEOCIN  T) 1 % SWAB, Apply to area 1-2 times daily, Disp: 60 each, Rfl: 1   clonazePAM  (KLONOPIN ) 0.5 MG tablet, TAKE 1 TABLET BY MOUTH EVERY DAY AT BEDTIME AS NEEDED FOR ANXIETY, Disp: 30 tablet, Rfl: 2   cyanocobalamin  (VITAMIN B12) 1000 MCG/ML injection, Inject 1ml into the muscle weekly x 4 weeks, then monthly x 3 months, Disp: 10 mL, Rfl: 0   Oxcarbazepine  (TRILEPTAL ) 300 MG tablet, Take 900 mg by mouth every evening., Disp: , Rfl:    propranolol  (INDERAL ) 10 MG tablet, Take 1  tablet (10 mg total) by mouth 3 (three) times daily as needed (Take 10mg  up to 3 times daily for palpitations)., Disp: 180 tablet, Rfl: 3   QUEtiapine  (SEROQUEL ) 400 MG tablet, Take 400 mg by mouth at bedtime., Disp: , Rfl:    Vitamin D , Ergocalciferol , (DRISDOL ) 1.25 MG (50000 UNIT) CAPS capsule, Take 1 capsule (50,000 Units total) by mouth every 7 (seven) days., Disp: 12 capsule, Rfl: 0  "

## 2024-04-12 ENCOUNTER — Telehealth: Admitting: Physician Assistant

## 2024-04-12 ENCOUNTER — Encounter: Payer: Self-pay | Admitting: Physician Assistant

## 2024-04-12 VITALS — Ht 65.0 in | Wt 240.0 lb

## 2024-04-12 DIAGNOSIS — R110 Nausea: Secondary | ICD-10-CM

## 2024-04-12 DIAGNOSIS — R300 Dysuria: Secondary | ICD-10-CM

## 2024-04-12 MED ORDER — ONDANSETRON HCL 8 MG PO TABS: 8.0000 mg | ORAL_TABLET | Freq: Three times a day (TID) | ORAL | 1 refills | Status: AC | PRN

## 2024-04-15 ENCOUNTER — Encounter: Payer: Self-pay | Admitting: Physician Assistant

## 2024-04-15 ENCOUNTER — Ambulatory Visit: Payer: Self-pay

## 2024-04-15 ENCOUNTER — Telehealth: Admitting: Physician Assistant

## 2024-04-15 VITALS — Ht 65.0 in | Wt 239.0 lb

## 2024-04-15 DIAGNOSIS — F419 Anxiety disorder, unspecified: Secondary | ICD-10-CM

## 2024-04-15 DIAGNOSIS — F3162 Bipolar disorder, current episode mixed, moderate: Secondary | ICD-10-CM

## 2024-04-15 NOTE — Telephone Encounter (Signed)
 Appt today

## 2024-04-15 NOTE — Telephone Encounter (Signed)
 FYI Only or Action Required?: FYI only for provider: appointment scheduled on 04/15/24.  Patient was last seen in primary care on 04/12/2024 by Job Lukes, PA.  Called Nurse Triage reporting Anxiety.  Symptoms began a week ago.  Interventions attempted: Prescription medications: clonazapam                             .  Symptoms are: unchanged.  Triage Disposition: See Physician Within 24 Hours  Patient/caregiver understands and will follow disposition?: Yes  Message from [facility] sent at 04/15/2024  8:09 AM EST  Summary: altered mentyal state   Reason for Triage: altered mental state,feeling burn out ,anxiety         Reason for Disposition  Patient sounds very upset or troubled to the triager  Answer Assessment - Initial Assessment Questions Scheduled 04/15/24 Advised call back or [facility]/ ED/911 if symptoms worsen. Patient verbalized understanding.    1. CONCERN: Did anything happen that prompted you to call today?      Feel overwhelmed,burnt out 2. ANXIETY SYMPTOMS: Can you describe how you (your loved one; patient) have been feeling? (e.g., tense, restless, panicky, anxious, keyed up, overwhelmed, sense of impending doom).      New job, kids been home 3. ONSET: How long have you been feeling this way? (e.g., hours, days, weeks)     2 weeks 4. SEVERITY: How would you rate the level of anxiety? (e.g., 0 - 10; or mild, moderate, severe).      5. FUNCTIONAL IMPAIRMENT: How have these feelings affected your ability to do daily activities? Have you had more difficulty than usual doing your normal daily activities? (e.g., getting better, same, worse; self-care, school, work, interactions)     Self care getting hard to do 6. HISTORY: Have you felt this way before? Have you ever been diagnosed with an anxiety problem in the past? (e.g., generalized anxiety disorder, panic attacks, PTSD). If Yes, ask: How was this problem treated? (e.g., medicines, counseling,  etc.)     bipolar 7. RISK OF HARM - SUICIDAL IDEATION: Do you ever have thoughts of hurting or killing yourself? If Yes, ask:  Do you have these feelings now? Do you have a plan on how you would do this?     no 8. TREATMENT:  What has been done so far to treat this anxiety? (e.g., medicines, relaxation strategies). What has helped?     Clonazepam  not as helpful;calmed down a little 9. THERAPIST: Do you have a counselor or therapist? If Yes, ask: What is their name? psychiatrist 11. PATIENT SUPPORT: Who is with you now? Who do you live with? Do you have family or friends who you can talk to?        no 12. OTHER SYMPTOMS: Do you have any other symptoms? (e.g., feeling depressed, trouble concentrating, trouble sleeping, trouble breathing, palpitations or fast heartbeat, chest pain, sweating, nausea, or diarrhea) Denies diff breath, chest pain, faint  Protocols used: Anxiety and Panic Attack-A-AH

## 2024-04-15 NOTE — Progress Notes (Signed)
 "   Virtual Visit via Video Note   I, Lucie Buttner, connected with  Lori Callahan  ([id number], 1985-03-22) on 04/15/24 at 10:20 AM EST by a video-enabled telemedicine application and verified that I am speaking with the correct person using two identifiers.  Location: Patient: Home Provider: [place] office   I discussed the limitations of evaluation and management by telemedicine and the availability of in person appointments. The patient expressed understanding and agreed to proceed.    Discussed the use of AI scribe software for clinical note transcription with the patient, who gave verbal consent to proceed.  History of Present Illness   Lori Callahan is a 39 year old female with anxiety and bipolar disorder who presents with overwhelming stress and anxiety related to her new job.  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 She reports severe anxiety at work with stomach knots and an urge to defecate, and feels exhausted and unable to cope, leading her to take today and tomorrow off.  She takes Seroquel  400 mg nightly, oxcarbazepine , clonazepam , and propranolol  three times daily. She often wakes with panic and a fast heartbeat and takes clonazepam  and propranolol  before work and again midday to manage symptoms.  She has largely stayed home for the past three weeks and feels isolated. Her children returned to school today, which has given her some quiet time that she feels is helpful.     PHQ 2 & 9 Depression Scale- Over the past 2 weeks, how often have you been bothered by any of the following problems? Little interest or pleasure in doing things: 2 Feeling down, depressed, or hopeless (PHQ Adolescent also includes...irritable): 2 PHQ-2 Total Score: 4 Trouble falling or staying asleep, or sleeping too much: 1 Feeling tired or having little energy: 2 Poor appetite or  overeating (PHQ Adolescent also includes...weight loss): 3 (poor appetite) Feeling bad about yourself - or that you are a failure or have let yourself or your family down: 0 Trouble concentrating on things, such as reading the newspaper or watching television (PHQ Adolescent also includes...like school work): 1 Moving or speaking so slowly that other people could have noticed. Or the opposite - being so fidgety or restless that you have been moving around a lot more than usual: 1 Thoughts that you would be better off dead, or of hurting yourself in some way: 0 PHQ-9 Total Score: 12 If you checked off any problems, how difficult have these problems made it for you to do your work, take care of things at home, or get along with other people?: Very difficult  Depression Treatment Depression Interventions/Treatment : Medication; Counseling      04/15/2024    9:57 AM 02/26/2024   11:35 AM 04/12/2021   10:08 AM 03/14/2020    8:50 AM  GAD 7 : Generalized Anxiety Score  Nervous, Anxious, on Edge 3 2  3  3    Control/stop worrying 3 2  2  3    Worry too much - different things 3 3  3  3    Trouble relaxing 2 1  2  3    Restless 2 0  2  3   Easily annoyed or irritable 2 1  3  3    Afraid - awful might happen 3 1  2  3    Total GAD 7 Score 18 10 17 21   Anxiety Difficulty Very difficult Very difficult Extremely  difficult Extremely difficult     Data saved with a previous flowsheet row definition      Problems:  Patient Active Problem List   Diagnosis Date Noted   Abnormal finding on thyroid  function test 02/26/2024   Attention deficit hyperactivity disorder 02/26/2024   Class 2 obesity 02/26/2024   Difficulty sleeping 02/26/2024   Fatigue 02/26/2024   Hot flashes 02/26/2024   Irritability 02/26/2024   Low libido 02/26/2024   Menopausal and female climacteric states 02/26/2024   Morbid obesity (HCC) 02/26/2024   Other specified health status 02/26/2024   Vaginal dryness 02/26/2024   Vitamin D   deficiency 02/26/2024   Palpitations 09/13/2021   Mastalgia 09/05/2021   Genetic testing 03/13/2021   Family history of breast cancer 02/22/2021   S/P laparoscopic hysterectomy 09/18/2020   MRSA (methicillin resistant Staphylococcus aureus) infection 06/21/2020   Hypothyroid 06/09/2020   Nevus of toe of left foot 05/02/2020   Aortic atherosclerosis 04/11/2020   Malignant melanoma of left foot (HCC) 03/17/2020   Bipolar disorder (HCC) 05/25/2019   Anxiety 05/25/2019    Allergies: Allergies[1] Medications: Current Medications[2]  Observations/Objective: Patient is well-developed, well-nourished in no acute distress.  Resting comfortably  at home.  Head is normocephalic, atraumatic.  No labored breathing.  Speech is clear and coherent with logical content.  Patient is alert and oriented at baseline.   Assessment and Plan    Bipolar disorder, current episode mixed, moderate Experiencing mixed episode with anxiety, exhaustion, and gastrointestinal symptoms. Job stressors contribute to symptoms. Current medications include Seroquel , oxcarbazepine , clonazepam , and propranolol . Considering ADA accommodations for stress management. - Continue medications per psychiatry, Dr Shirline - Provided a doctor's note for today and tomorrow off work. - Assisted with ADA paperwork for work accommodations, including four days off per month and early departure for appointments. - Referred to [facility] for virtual therapy sessions. MyChart message sent with this information.  Anxiety disorder Anxiety exacerbated by job demands and personal stressors. Symptoms include panic attacks, tachycardia, and gastrointestinal distress. Current management includes clonazepam  and propranolol . Seeking therapy for anxiety and stress management. - Continue clonazepam  and propranolol  three times a day as needed for anxiety. - Referred to [facility] for virtual therapy sessions to address anxiety and stress  management.        Follow Up Instructions: I discussed the assessment and treatment plan with the patient. The patient was provided an opportunity to ask questions and all were answered. The patient agreed with the plan and demonstrated an understanding of the instructions.  A copy of instructions were sent to the patient via MyChart unless otherwise noted below.   The patient was advised to call back or seek an in-person evaluation if the symptoms worsen or if the condition fails to improve as anticipated.  Lucie Buttner, PA    [1]  Allergies Allergen Reactions   Reglan  [Metoclopramide ] Shortness Of Breath  [2]  Current Outpatient Medications:    clonazePAM  (KLONOPIN ) 0.5 MG tablet, TAKE 1 TABLET BY MOUTH EVERY DAY AT BEDTIME AS NEEDED FOR ANXIETY, Disp: 30 tablet, Rfl: 2   cyanocobalamin  (VITAMIN B12) 1000 MCG/ML injection, Inject 1ml into the muscle weekly x 4 weeks, then monthly x 3 months, Disp: 10 mL, Rfl: 0   ondansetron  (ZOFRAN ) 8 MG tablet, Take 1 tablet (8 mg total) by mouth every 8 (eight) hours as needed for nausea or vomiting., Disp: 30 tablet, Rfl: 1   Oxcarbazepine  (TRILEPTAL ) 300 MG tablet, Take 900 mg by mouth every evening. (Patient taking  differently: Take 600 mg by mouth every evening.), Disp: , Rfl:    propranolol  (INDERAL ) 10 MG tablet, Take 1 tablet (10 mg total) by mouth 3 (three) times daily as needed (Take 10mg  up to 3 times daily for palpitations)., Disp: 180 tablet, Rfl: 3   QUEtiapine  (SEROQUEL ) 400 MG tablet, Take 400 mg by mouth at bedtime., Disp: , Rfl:    Vitamin D , Ergocalciferol , (DRISDOL ) 1.25 MG (50000 UNIT) CAPS capsule, Take 1 capsule (50,000 Units total) by mouth every 7 (seven) days., Disp: 12 capsule, Rfl: 0  "

## 2024-04-22 ENCOUNTER — Ambulatory Visit: Admitting: Physician Assistant

## 2024-06-08 ENCOUNTER — Ambulatory Visit: Admitting: Dermatology
